# Patient Record
Sex: Male | Born: 1935 | Race: White | Hispanic: No
Health system: Southern US, Community
[De-identification: ages and names within clinical notes are randomized; demographics above are authoritative.]

## PROBLEM LIST (undated history)

## (undated) DIAGNOSIS — E119 Type 2 diabetes mellitus without complications: Secondary | ICD-10-CM

## (undated) DIAGNOSIS — I10 Essential (primary) hypertension: Secondary | ICD-10-CM

## (undated) HISTORY — PX: OTHER SURGICAL HISTORY: SHX169

---

## 2015-04-06 DIAGNOSIS — Z0000 Encounter for general adult medical examination without abnormal findings: Secondary | ICD-10-CM | POA: Diagnosis not present

## 2015-04-06 DIAGNOSIS — E7800 Pure hypercholesterolemia, unspecified: Secondary | ICD-10-CM | POA: Diagnosis not present

## 2015-04-06 DIAGNOSIS — Z1389 Encounter for screening for other disorder: Secondary | ICD-10-CM | POA: Diagnosis not present

## 2015-04-06 DIAGNOSIS — I10 Essential (primary) hypertension: Secondary | ICD-10-CM | POA: Diagnosis not present

## 2015-04-06 DIAGNOSIS — Z23 Encounter for immunization: Secondary | ICD-10-CM | POA: Diagnosis not present

## 2015-04-06 DIAGNOSIS — I679 Cerebrovascular disease, unspecified: Secondary | ICD-10-CM | POA: Diagnosis not present

## 2015-04-06 DIAGNOSIS — E119 Type 2 diabetes mellitus without complications: Secondary | ICD-10-CM | POA: Diagnosis not present

## 2015-05-06 DIAGNOSIS — B351 Tinea unguium: Secondary | ICD-10-CM | POA: Diagnosis not present

## 2015-07-08 DIAGNOSIS — B351 Tinea unguium: Secondary | ICD-10-CM | POA: Diagnosis not present

## 2015-07-08 DIAGNOSIS — E119 Type 2 diabetes mellitus without complications: Secondary | ICD-10-CM | POA: Diagnosis not present

## 2015-07-28 DIAGNOSIS — S91312A Laceration without foreign body, left foot, initial encounter: Secondary | ICD-10-CM | POA: Diagnosis not present

## 2015-07-28 DIAGNOSIS — W25XXXA Contact with sharp glass, initial encounter: Secondary | ICD-10-CM | POA: Diagnosis not present

## 2015-09-04 DIAGNOSIS — R69 Illness, unspecified: Secondary | ICD-10-CM | POA: Diagnosis not present

## 2015-10-08 DIAGNOSIS — E7800 Pure hypercholesterolemia, unspecified: Secondary | ICD-10-CM | POA: Diagnosis not present

## 2015-10-08 DIAGNOSIS — I10 Essential (primary) hypertension: Secondary | ICD-10-CM | POA: Diagnosis not present

## 2015-10-08 DIAGNOSIS — E119 Type 2 diabetes mellitus without complications: Secondary | ICD-10-CM | POA: Diagnosis not present

## 2015-10-26 DIAGNOSIS — S12590A Other displaced fracture of sixth cervical vertebra, initial encounter for closed fracture: Secondary | ICD-10-CM | POA: Diagnosis not present

## 2015-10-26 DIAGNOSIS — S134XXA Sprain of ligaments of cervical spine, initial encounter: Secondary | ICD-10-CM | POA: Diagnosis not present

## 2015-10-26 DIAGNOSIS — Z7982 Long term (current) use of aspirin: Secondary | ICD-10-CM | POA: Diagnosis not present

## 2015-10-26 DIAGNOSIS — S12501A Unspecified nondisplaced fracture of sixth cervical vertebra, initial encounter for closed fracture: Secondary | ICD-10-CM | POA: Diagnosis not present

## 2015-10-26 DIAGNOSIS — S0990XA Unspecified injury of head, initial encounter: Secondary | ICD-10-CM | POA: Diagnosis not present

## 2015-10-26 DIAGNOSIS — S0181XA Laceration without foreign body of other part of head, initial encounter: Secondary | ICD-10-CM | POA: Diagnosis not present

## 2015-10-26 DIAGNOSIS — S12591A Other nondisplaced fracture of sixth cervical vertebra, initial encounter for closed fracture: Secondary | ICD-10-CM | POA: Diagnosis not present

## 2015-10-26 DIAGNOSIS — I10 Essential (primary) hypertension: Secondary | ICD-10-CM | POA: Diagnosis not present

## 2015-10-26 DIAGNOSIS — S199XXA Unspecified injury of neck, initial encounter: Secondary | ICD-10-CM | POA: Diagnosis not present

## 2015-10-26 DIAGNOSIS — Z79899 Other long term (current) drug therapy: Secondary | ICD-10-CM | POA: Diagnosis not present

## 2015-10-26 DIAGNOSIS — W010XXA Fall on same level from slipping, tripping and stumbling without subsequent striking against object, initial encounter: Secondary | ICD-10-CM | POA: Diagnosis not present

## 2015-10-26 DIAGNOSIS — M542 Cervicalgia: Secondary | ICD-10-CM | POA: Diagnosis not present

## 2015-10-27 DIAGNOSIS — M452 Ankylosing spondylitis of cervical region: Secondary | ICD-10-CM | POA: Diagnosis not present

## 2015-10-29 DIAGNOSIS — Z79899 Other long term (current) drug therapy: Secondary | ICD-10-CM | POA: Diagnosis not present

## 2015-10-29 DIAGNOSIS — S12100A Unspecified displaced fracture of second cervical vertebra, initial encounter for closed fracture: Secondary | ICD-10-CM | POA: Diagnosis not present

## 2015-10-29 DIAGNOSIS — E119 Type 2 diabetes mellitus without complications: Secondary | ICD-10-CM | POA: Diagnosis not present

## 2015-10-29 DIAGNOSIS — I10 Essential (primary) hypertension: Secondary | ICD-10-CM | POA: Diagnosis not present

## 2015-10-29 DIAGNOSIS — Z4789 Encounter for other orthopedic aftercare: Secondary | ICD-10-CM | POA: Diagnosis not present

## 2015-10-29 DIAGNOSIS — M6281 Muscle weakness (generalized): Secondary | ICD-10-CM | POA: Diagnosis not present

## 2015-10-29 DIAGNOSIS — Z981 Arthrodesis status: Secondary | ICD-10-CM | POA: Diagnosis not present

## 2015-10-29 DIAGNOSIS — M549 Dorsalgia, unspecified: Secondary | ICD-10-CM | POA: Diagnosis not present

## 2015-10-29 DIAGNOSIS — E1165 Type 2 diabetes mellitus with hyperglycemia: Secondary | ICD-10-CM | POA: Diagnosis not present

## 2015-10-29 DIAGNOSIS — Z9181 History of falling: Secondary | ICD-10-CM | POA: Diagnosis not present

## 2015-10-29 DIAGNOSIS — S12601A Unspecified nondisplaced fracture of seventh cervical vertebra, initial encounter for closed fracture: Secondary | ICD-10-CM | POA: Diagnosis not present

## 2015-10-29 DIAGNOSIS — M4322 Fusion of spine, cervical region: Secondary | ICD-10-CM | POA: Diagnosis not present

## 2015-10-29 DIAGNOSIS — S12591A Other nondisplaced fracture of sixth cervical vertebra, initial encounter for closed fracture: Secondary | ICD-10-CM | POA: Diagnosis not present

## 2015-10-29 DIAGNOSIS — E785 Hyperlipidemia, unspecified: Secondary | ICD-10-CM | POA: Diagnosis not present

## 2015-10-29 DIAGNOSIS — S0181XA Laceration without foreign body of other part of head, initial encounter: Secondary | ICD-10-CM | POA: Diagnosis not present

## 2015-10-29 DIAGNOSIS — S12501A Unspecified nondisplaced fracture of sixth cervical vertebra, initial encounter for closed fracture: Secondary | ICD-10-CM | POA: Diagnosis not present

## 2015-10-29 DIAGNOSIS — R52 Pain, unspecified: Secondary | ICD-10-CM | POA: Diagnosis not present

## 2015-10-29 DIAGNOSIS — M25462 Effusion, left knee: Secondary | ICD-10-CM | POA: Diagnosis not present

## 2015-10-29 DIAGNOSIS — S12501D Unspecified nondisplaced fracture of sixth cervical vertebra, subsequent encounter for fracture with routine healing: Secondary | ICD-10-CM | POA: Diagnosis not present

## 2015-10-29 DIAGNOSIS — R262 Difficulty in walking, not elsewhere classified: Secondary | ICD-10-CM | POA: Diagnosis not present

## 2015-10-29 DIAGNOSIS — S0191XA Laceration without foreign body of unspecified part of head, initial encounter: Secondary | ICD-10-CM | POA: Diagnosis not present

## 2015-10-29 DIAGNOSIS — S129XXA Fracture of neck, unspecified, initial encounter: Secondary | ICD-10-CM | POA: Diagnosis not present

## 2015-10-29 DIAGNOSIS — Z87891 Personal history of nicotine dependence: Secondary | ICD-10-CM | POA: Diagnosis not present

## 2015-10-29 DIAGNOSIS — Z01818 Encounter for other preprocedural examination: Secondary | ICD-10-CM | POA: Diagnosis not present

## 2015-10-29 DIAGNOSIS — M25461 Effusion, right knee: Secondary | ICD-10-CM | POA: Diagnosis not present

## 2015-10-29 DIAGNOSIS — S12601D Unspecified nondisplaced fracture of seventh cervical vertebra, subsequent encounter for fracture with routine healing: Secondary | ICD-10-CM | POA: Diagnosis not present

## 2015-11-03 DIAGNOSIS — S12100A Unspecified displaced fracture of second cervical vertebra, initial encounter for closed fracture: Secondary | ICD-10-CM | POA: Diagnosis not present

## 2015-11-03 DIAGNOSIS — L03115 Cellulitis of right lower limb: Secondary | ICD-10-CM | POA: Diagnosis not present

## 2015-11-03 DIAGNOSIS — S12501D Unspecified nondisplaced fracture of sixth cervical vertebra, subsequent encounter for fracture with routine healing: Secondary | ICD-10-CM | POA: Diagnosis not present

## 2015-11-03 DIAGNOSIS — I350 Nonrheumatic aortic (valve) stenosis: Secondary | ICD-10-CM | POA: Diagnosis not present

## 2015-11-03 DIAGNOSIS — R262 Difficulty in walking, not elsewhere classified: Secondary | ICD-10-CM | POA: Diagnosis not present

## 2015-11-03 DIAGNOSIS — Z9181 History of falling: Secondary | ICD-10-CM | POA: Diagnosis not present

## 2015-11-03 DIAGNOSIS — E119 Type 2 diabetes mellitus without complications: Secondary | ICD-10-CM | POA: Diagnosis not present

## 2015-11-03 DIAGNOSIS — S12500A Unspecified displaced fracture of sixth cervical vertebra, initial encounter for closed fracture: Secondary | ICD-10-CM | POA: Diagnosis not present

## 2015-11-03 DIAGNOSIS — S12601D Unspecified nondisplaced fracture of seventh cervical vertebra, subsequent encounter for fracture with routine healing: Secondary | ICD-10-CM | POA: Diagnosis not present

## 2015-11-03 DIAGNOSIS — E785 Hyperlipidemia, unspecified: Secondary | ICD-10-CM | POA: Diagnosis not present

## 2015-11-03 DIAGNOSIS — Z981 Arthrodesis status: Secondary | ICD-10-CM | POA: Diagnosis not present

## 2015-11-03 DIAGNOSIS — R52 Pain, unspecified: Secondary | ICD-10-CM | POA: Diagnosis not present

## 2015-11-03 DIAGNOSIS — I10 Essential (primary) hypertension: Secondary | ICD-10-CM | POA: Diagnosis not present

## 2015-11-03 DIAGNOSIS — M6281 Muscle weakness (generalized): Secondary | ICD-10-CM | POA: Diagnosis not present

## 2015-11-03 DIAGNOSIS — S0191XA Laceration without foreign body of unspecified part of head, initial encounter: Secondary | ICD-10-CM | POA: Diagnosis not present

## 2015-11-04 DIAGNOSIS — I10 Essential (primary) hypertension: Secondary | ICD-10-CM | POA: Diagnosis not present

## 2015-11-04 DIAGNOSIS — L03115 Cellulitis of right lower limb: Secondary | ICD-10-CM | POA: Diagnosis not present

## 2015-11-04 DIAGNOSIS — S12500A Unspecified displaced fracture of sixth cervical vertebra, initial encounter for closed fracture: Secondary | ICD-10-CM | POA: Diagnosis not present

## 2015-11-04 DIAGNOSIS — I350 Nonrheumatic aortic (valve) stenosis: Secondary | ICD-10-CM | POA: Diagnosis not present

## 2015-11-04 DIAGNOSIS — E119 Type 2 diabetes mellitus without complications: Secondary | ICD-10-CM | POA: Diagnosis not present

## 2015-11-13 DIAGNOSIS — E119 Type 2 diabetes mellitus without complications: Secondary | ICD-10-CM | POA: Diagnosis not present

## 2015-11-13 DIAGNOSIS — E785 Hyperlipidemia, unspecified: Secondary | ICD-10-CM | POA: Diagnosis not present

## 2015-11-13 DIAGNOSIS — Z7982 Long term (current) use of aspirin: Secondary | ICD-10-CM | POA: Diagnosis not present

## 2015-11-13 DIAGNOSIS — S12601D Unspecified nondisplaced fracture of seventh cervical vertebra, subsequent encounter for fracture with routine healing: Secondary | ICD-10-CM | POA: Diagnosis not present

## 2015-11-13 DIAGNOSIS — I10 Essential (primary) hypertension: Secondary | ICD-10-CM | POA: Diagnosis not present

## 2015-11-13 DIAGNOSIS — Z4732 Aftercare following explantation of hip joint prosthesis: Secondary | ICD-10-CM | POA: Diagnosis not present

## 2015-11-13 DIAGNOSIS — Z7984 Long term (current) use of oral hypoglycemic drugs: Secondary | ICD-10-CM | POA: Diagnosis not present

## 2015-11-13 DIAGNOSIS — S0181XD Laceration without foreign body of other part of head, subsequent encounter: Secondary | ICD-10-CM | POA: Diagnosis not present

## 2015-11-13 DIAGNOSIS — S12501D Unspecified nondisplaced fracture of sixth cervical vertebra, subsequent encounter for fracture with routine healing: Secondary | ICD-10-CM | POA: Diagnosis not present

## 2015-11-13 DIAGNOSIS — Z981 Arthrodesis status: Secondary | ICD-10-CM | POA: Diagnosis not present

## 2015-11-15 DIAGNOSIS — Z981 Arthrodesis status: Secondary | ICD-10-CM | POA: Diagnosis not present

## 2015-11-15 DIAGNOSIS — Z4732 Aftercare following explantation of hip joint prosthesis: Secondary | ICD-10-CM | POA: Diagnosis not present

## 2015-11-15 DIAGNOSIS — E119 Type 2 diabetes mellitus without complications: Secondary | ICD-10-CM | POA: Diagnosis not present

## 2015-11-15 DIAGNOSIS — Z7982 Long term (current) use of aspirin: Secondary | ICD-10-CM | POA: Diagnosis not present

## 2015-11-15 DIAGNOSIS — S12601D Unspecified nondisplaced fracture of seventh cervical vertebra, subsequent encounter for fracture with routine healing: Secondary | ICD-10-CM | POA: Diagnosis not present

## 2015-11-15 DIAGNOSIS — S0181XD Laceration without foreign body of other part of head, subsequent encounter: Secondary | ICD-10-CM | POA: Diagnosis not present

## 2015-11-15 DIAGNOSIS — I10 Essential (primary) hypertension: Secondary | ICD-10-CM | POA: Diagnosis not present

## 2015-11-15 DIAGNOSIS — E785 Hyperlipidemia, unspecified: Secondary | ICD-10-CM | POA: Diagnosis not present

## 2015-11-15 DIAGNOSIS — Z7984 Long term (current) use of oral hypoglycemic drugs: Secondary | ICD-10-CM | POA: Diagnosis not present

## 2015-11-15 DIAGNOSIS — S12501D Unspecified nondisplaced fracture of sixth cervical vertebra, subsequent encounter for fracture with routine healing: Secondary | ICD-10-CM | POA: Diagnosis not present

## 2015-11-22 DIAGNOSIS — Z981 Arthrodesis status: Secondary | ICD-10-CM | POA: Diagnosis not present

## 2015-11-22 DIAGNOSIS — E119 Type 2 diabetes mellitus without complications: Secondary | ICD-10-CM | POA: Diagnosis not present

## 2015-11-22 DIAGNOSIS — I10 Essential (primary) hypertension: Secondary | ICD-10-CM | POA: Diagnosis not present

## 2015-11-22 DIAGNOSIS — Z7982 Long term (current) use of aspirin: Secondary | ICD-10-CM | POA: Diagnosis not present

## 2015-11-22 DIAGNOSIS — S12501D Unspecified nondisplaced fracture of sixth cervical vertebra, subsequent encounter for fracture with routine healing: Secondary | ICD-10-CM | POA: Diagnosis not present

## 2015-11-22 DIAGNOSIS — S0181XD Laceration without foreign body of other part of head, subsequent encounter: Secondary | ICD-10-CM | POA: Diagnosis not present

## 2015-11-22 DIAGNOSIS — Z4732 Aftercare following explantation of hip joint prosthesis: Secondary | ICD-10-CM | POA: Diagnosis not present

## 2015-11-22 DIAGNOSIS — S12601D Unspecified nondisplaced fracture of seventh cervical vertebra, subsequent encounter for fracture with routine healing: Secondary | ICD-10-CM | POA: Diagnosis not present

## 2015-11-22 DIAGNOSIS — E785 Hyperlipidemia, unspecified: Secondary | ICD-10-CM | POA: Diagnosis not present

## 2015-11-22 DIAGNOSIS — Z7984 Long term (current) use of oral hypoglycemic drugs: Secondary | ICD-10-CM | POA: Diagnosis not present

## 2015-11-24 DIAGNOSIS — I10 Essential (primary) hypertension: Secondary | ICD-10-CM | POA: Diagnosis not present

## 2015-11-24 DIAGNOSIS — Z7982 Long term (current) use of aspirin: Secondary | ICD-10-CM | POA: Diagnosis not present

## 2015-11-24 DIAGNOSIS — S12601D Unspecified nondisplaced fracture of seventh cervical vertebra, subsequent encounter for fracture with routine healing: Secondary | ICD-10-CM | POA: Diagnosis not present

## 2015-11-24 DIAGNOSIS — Z4732 Aftercare following explantation of hip joint prosthesis: Secondary | ICD-10-CM | POA: Diagnosis not present

## 2015-11-24 DIAGNOSIS — Z981 Arthrodesis status: Secondary | ICD-10-CM | POA: Diagnosis not present

## 2015-11-24 DIAGNOSIS — Z7984 Long term (current) use of oral hypoglycemic drugs: Secondary | ICD-10-CM | POA: Diagnosis not present

## 2015-11-24 DIAGNOSIS — E119 Type 2 diabetes mellitus without complications: Secondary | ICD-10-CM | POA: Diagnosis not present

## 2015-11-24 DIAGNOSIS — S12501D Unspecified nondisplaced fracture of sixth cervical vertebra, subsequent encounter for fracture with routine healing: Secondary | ICD-10-CM | POA: Diagnosis not present

## 2015-11-24 DIAGNOSIS — E785 Hyperlipidemia, unspecified: Secondary | ICD-10-CM | POA: Diagnosis not present

## 2015-11-24 DIAGNOSIS — S0181XD Laceration without foreign body of other part of head, subsequent encounter: Secondary | ICD-10-CM | POA: Diagnosis not present

## 2015-11-25 DIAGNOSIS — M542 Cervicalgia: Secondary | ICD-10-CM | POA: Diagnosis not present

## 2015-11-26 DIAGNOSIS — S12601D Unspecified nondisplaced fracture of seventh cervical vertebra, subsequent encounter for fracture with routine healing: Secondary | ICD-10-CM | POA: Diagnosis not present

## 2015-11-26 DIAGNOSIS — Z4732 Aftercare following explantation of hip joint prosthesis: Secondary | ICD-10-CM | POA: Diagnosis not present

## 2015-11-26 DIAGNOSIS — S12501D Unspecified nondisplaced fracture of sixth cervical vertebra, subsequent encounter for fracture with routine healing: Secondary | ICD-10-CM | POA: Diagnosis not present

## 2015-11-26 DIAGNOSIS — Z7984 Long term (current) use of oral hypoglycemic drugs: Secondary | ICD-10-CM | POA: Diagnosis not present

## 2015-11-26 DIAGNOSIS — S0181XD Laceration without foreign body of other part of head, subsequent encounter: Secondary | ICD-10-CM | POA: Diagnosis not present

## 2015-11-26 DIAGNOSIS — Z981 Arthrodesis status: Secondary | ICD-10-CM | POA: Diagnosis not present

## 2015-11-26 DIAGNOSIS — I10 Essential (primary) hypertension: Secondary | ICD-10-CM | POA: Diagnosis not present

## 2015-11-26 DIAGNOSIS — E119 Type 2 diabetes mellitus without complications: Secondary | ICD-10-CM | POA: Diagnosis not present

## 2015-11-26 DIAGNOSIS — Z7982 Long term (current) use of aspirin: Secondary | ICD-10-CM | POA: Diagnosis not present

## 2015-11-26 DIAGNOSIS — E785 Hyperlipidemia, unspecified: Secondary | ICD-10-CM | POA: Diagnosis not present

## 2015-11-29 DIAGNOSIS — E119 Type 2 diabetes mellitus without complications: Secondary | ICD-10-CM | POA: Diagnosis not present

## 2015-11-29 DIAGNOSIS — Z7982 Long term (current) use of aspirin: Secondary | ICD-10-CM | POA: Diagnosis not present

## 2015-11-29 DIAGNOSIS — Z981 Arthrodesis status: Secondary | ICD-10-CM | POA: Diagnosis not present

## 2015-11-29 DIAGNOSIS — E785 Hyperlipidemia, unspecified: Secondary | ICD-10-CM | POA: Diagnosis not present

## 2015-11-29 DIAGNOSIS — Z7984 Long term (current) use of oral hypoglycemic drugs: Secondary | ICD-10-CM | POA: Diagnosis not present

## 2015-11-29 DIAGNOSIS — S12601D Unspecified nondisplaced fracture of seventh cervical vertebra, subsequent encounter for fracture with routine healing: Secondary | ICD-10-CM | POA: Diagnosis not present

## 2015-11-29 DIAGNOSIS — I10 Essential (primary) hypertension: Secondary | ICD-10-CM | POA: Diagnosis not present

## 2015-11-29 DIAGNOSIS — S12501D Unspecified nondisplaced fracture of sixth cervical vertebra, subsequent encounter for fracture with routine healing: Secondary | ICD-10-CM | POA: Diagnosis not present

## 2015-11-29 DIAGNOSIS — S0181XD Laceration without foreign body of other part of head, subsequent encounter: Secondary | ICD-10-CM | POA: Diagnosis not present

## 2015-11-29 DIAGNOSIS — Z4732 Aftercare following explantation of hip joint prosthesis: Secondary | ICD-10-CM | POA: Diagnosis not present

## 2015-12-01 DIAGNOSIS — E785 Hyperlipidemia, unspecified: Secondary | ICD-10-CM | POA: Diagnosis not present

## 2015-12-01 DIAGNOSIS — E119 Type 2 diabetes mellitus without complications: Secondary | ICD-10-CM | POA: Diagnosis not present

## 2015-12-01 DIAGNOSIS — Z7982 Long term (current) use of aspirin: Secondary | ICD-10-CM | POA: Diagnosis not present

## 2015-12-01 DIAGNOSIS — S12501D Unspecified nondisplaced fracture of sixth cervical vertebra, subsequent encounter for fracture with routine healing: Secondary | ICD-10-CM | POA: Diagnosis not present

## 2015-12-01 DIAGNOSIS — S12601D Unspecified nondisplaced fracture of seventh cervical vertebra, subsequent encounter for fracture with routine healing: Secondary | ICD-10-CM | POA: Diagnosis not present

## 2015-12-01 DIAGNOSIS — I10 Essential (primary) hypertension: Secondary | ICD-10-CM | POA: Diagnosis not present

## 2015-12-01 DIAGNOSIS — Z4732 Aftercare following explantation of hip joint prosthesis: Secondary | ICD-10-CM | POA: Diagnosis not present

## 2015-12-01 DIAGNOSIS — S0181XD Laceration without foreign body of other part of head, subsequent encounter: Secondary | ICD-10-CM | POA: Diagnosis not present

## 2015-12-01 DIAGNOSIS — Z7984 Long term (current) use of oral hypoglycemic drugs: Secondary | ICD-10-CM | POA: Diagnosis not present

## 2015-12-01 DIAGNOSIS — Z981 Arthrodesis status: Secondary | ICD-10-CM | POA: Diagnosis not present

## 2015-12-03 DIAGNOSIS — E785 Hyperlipidemia, unspecified: Secondary | ICD-10-CM | POA: Diagnosis not present

## 2015-12-03 DIAGNOSIS — E119 Type 2 diabetes mellitus without complications: Secondary | ICD-10-CM | POA: Diagnosis not present

## 2015-12-03 DIAGNOSIS — S12501D Unspecified nondisplaced fracture of sixth cervical vertebra, subsequent encounter for fracture with routine healing: Secondary | ICD-10-CM | POA: Diagnosis not present

## 2015-12-03 DIAGNOSIS — Z981 Arthrodesis status: Secondary | ICD-10-CM | POA: Diagnosis not present

## 2015-12-03 DIAGNOSIS — I10 Essential (primary) hypertension: Secondary | ICD-10-CM | POA: Diagnosis not present

## 2015-12-03 DIAGNOSIS — Z4732 Aftercare following explantation of hip joint prosthesis: Secondary | ICD-10-CM | POA: Diagnosis not present

## 2015-12-03 DIAGNOSIS — S0181XD Laceration without foreign body of other part of head, subsequent encounter: Secondary | ICD-10-CM | POA: Diagnosis not present

## 2015-12-03 DIAGNOSIS — Z7982 Long term (current) use of aspirin: Secondary | ICD-10-CM | POA: Diagnosis not present

## 2015-12-03 DIAGNOSIS — S12601D Unspecified nondisplaced fracture of seventh cervical vertebra, subsequent encounter for fracture with routine healing: Secondary | ICD-10-CM | POA: Diagnosis not present

## 2015-12-03 DIAGNOSIS — Z7984 Long term (current) use of oral hypoglycemic drugs: Secondary | ICD-10-CM | POA: Diagnosis not present

## 2015-12-06 DIAGNOSIS — S0181XD Laceration without foreign body of other part of head, subsequent encounter: Secondary | ICD-10-CM | POA: Diagnosis not present

## 2015-12-06 DIAGNOSIS — Z981 Arthrodesis status: Secondary | ICD-10-CM | POA: Diagnosis not present

## 2015-12-06 DIAGNOSIS — S12501D Unspecified nondisplaced fracture of sixth cervical vertebra, subsequent encounter for fracture with routine healing: Secondary | ICD-10-CM | POA: Diagnosis not present

## 2015-12-06 DIAGNOSIS — Z7984 Long term (current) use of oral hypoglycemic drugs: Secondary | ICD-10-CM | POA: Diagnosis not present

## 2015-12-06 DIAGNOSIS — E119 Type 2 diabetes mellitus without complications: Secondary | ICD-10-CM | POA: Diagnosis not present

## 2015-12-06 DIAGNOSIS — I10 Essential (primary) hypertension: Secondary | ICD-10-CM | POA: Diagnosis not present

## 2015-12-06 DIAGNOSIS — S12601D Unspecified nondisplaced fracture of seventh cervical vertebra, subsequent encounter for fracture with routine healing: Secondary | ICD-10-CM | POA: Diagnosis not present

## 2015-12-06 DIAGNOSIS — E785 Hyperlipidemia, unspecified: Secondary | ICD-10-CM | POA: Diagnosis not present

## 2015-12-06 DIAGNOSIS — Z7982 Long term (current) use of aspirin: Secondary | ICD-10-CM | POA: Diagnosis not present

## 2015-12-06 DIAGNOSIS — Z4732 Aftercare following explantation of hip joint prosthesis: Secondary | ICD-10-CM | POA: Diagnosis not present

## 2015-12-08 DIAGNOSIS — Z7984 Long term (current) use of oral hypoglycemic drugs: Secondary | ICD-10-CM | POA: Diagnosis not present

## 2015-12-08 DIAGNOSIS — Z7982 Long term (current) use of aspirin: Secondary | ICD-10-CM | POA: Diagnosis not present

## 2015-12-08 DIAGNOSIS — Z4732 Aftercare following explantation of hip joint prosthesis: Secondary | ICD-10-CM | POA: Diagnosis not present

## 2015-12-08 DIAGNOSIS — S0181XD Laceration without foreign body of other part of head, subsequent encounter: Secondary | ICD-10-CM | POA: Diagnosis not present

## 2015-12-08 DIAGNOSIS — Z981 Arthrodesis status: Secondary | ICD-10-CM | POA: Diagnosis not present

## 2015-12-08 DIAGNOSIS — E119 Type 2 diabetes mellitus without complications: Secondary | ICD-10-CM | POA: Diagnosis not present

## 2015-12-08 DIAGNOSIS — S12601D Unspecified nondisplaced fracture of seventh cervical vertebra, subsequent encounter for fracture with routine healing: Secondary | ICD-10-CM | POA: Diagnosis not present

## 2015-12-08 DIAGNOSIS — I10 Essential (primary) hypertension: Secondary | ICD-10-CM | POA: Diagnosis not present

## 2015-12-08 DIAGNOSIS — E785 Hyperlipidemia, unspecified: Secondary | ICD-10-CM | POA: Diagnosis not present

## 2015-12-08 DIAGNOSIS — S12501D Unspecified nondisplaced fracture of sixth cervical vertebra, subsequent encounter for fracture with routine healing: Secondary | ICD-10-CM | POA: Diagnosis not present

## 2015-12-13 DIAGNOSIS — S0181XD Laceration without foreign body of other part of head, subsequent encounter: Secondary | ICD-10-CM | POA: Diagnosis not present

## 2015-12-13 DIAGNOSIS — Z4732 Aftercare following explantation of hip joint prosthesis: Secondary | ICD-10-CM | POA: Diagnosis not present

## 2015-12-13 DIAGNOSIS — S12501D Unspecified nondisplaced fracture of sixth cervical vertebra, subsequent encounter for fracture with routine healing: Secondary | ICD-10-CM | POA: Diagnosis not present

## 2015-12-13 DIAGNOSIS — S12601D Unspecified nondisplaced fracture of seventh cervical vertebra, subsequent encounter for fracture with routine healing: Secondary | ICD-10-CM | POA: Diagnosis not present

## 2015-12-28 DIAGNOSIS — S12591D Other nondisplaced fracture of sixth cervical vertebra, subsequent encounter for fracture with routine healing: Secondary | ICD-10-CM | POA: Diagnosis not present

## 2015-12-28 DIAGNOSIS — S12601D Unspecified nondisplaced fracture of seventh cervical vertebra, subsequent encounter for fracture with routine healing: Secondary | ICD-10-CM | POA: Diagnosis not present

## 2016-02-03 DIAGNOSIS — M13861 Other specified arthritis, right knee: Secondary | ICD-10-CM | POA: Diagnosis not present

## 2016-02-03 DIAGNOSIS — S12601D Unspecified nondisplaced fracture of seventh cervical vertebra, subsequent encounter for fracture with routine healing: Secondary | ICD-10-CM | POA: Diagnosis not present

## 2016-04-19 DIAGNOSIS — Z23 Encounter for immunization: Secondary | ICD-10-CM | POA: Diagnosis not present

## 2016-04-19 DIAGNOSIS — E119 Type 2 diabetes mellitus without complications: Secondary | ICD-10-CM | POA: Diagnosis not present

## 2016-07-24 DIAGNOSIS — Z0000 Encounter for general adult medical examination without abnormal findings: Secondary | ICD-10-CM | POA: Diagnosis not present

## 2016-07-24 DIAGNOSIS — E119 Type 2 diabetes mellitus without complications: Secondary | ICD-10-CM | POA: Diagnosis not present

## 2016-07-24 DIAGNOSIS — N401 Enlarged prostate with lower urinary tract symptoms: Secondary | ICD-10-CM | POA: Diagnosis not present

## 2016-07-24 DIAGNOSIS — I10 Essential (primary) hypertension: Secondary | ICD-10-CM | POA: Diagnosis not present

## 2016-07-24 DIAGNOSIS — E785 Hyperlipidemia, unspecified: Secondary | ICD-10-CM | POA: Diagnosis not present

## 2016-07-24 DIAGNOSIS — Z1212 Encounter for screening for malignant neoplasm of rectum: Secondary | ICD-10-CM | POA: Diagnosis not present

## 2016-07-24 DIAGNOSIS — Z1389 Encounter for screening for other disorder: Secondary | ICD-10-CM | POA: Diagnosis not present

## 2016-08-03 DIAGNOSIS — M13861 Other specified arthritis, right knee: Secondary | ICD-10-CM | POA: Diagnosis not present

## 2016-08-03 DIAGNOSIS — S12591D Other nondisplaced fracture of sixth cervical vertebra, subsequent encounter for fracture with routine healing: Secondary | ICD-10-CM | POA: Diagnosis not present

## 2016-08-03 DIAGNOSIS — S12601D Unspecified nondisplaced fracture of seventh cervical vertebra, subsequent encounter for fracture with routine healing: Secondary | ICD-10-CM | POA: Diagnosis not present

## 2016-08-23 DIAGNOSIS — N400 Enlarged prostate without lower urinary tract symptoms: Secondary | ICD-10-CM | POA: Diagnosis not present

## 2016-08-23 DIAGNOSIS — H6122 Impacted cerumen, left ear: Secondary | ICD-10-CM | POA: Diagnosis not present

## 2016-10-28 DIAGNOSIS — S0181XA Laceration without foreign body of other part of head, initial encounter: Secondary | ICD-10-CM | POA: Diagnosis not present

## 2016-12-20 DIAGNOSIS — E119 Type 2 diabetes mellitus without complications: Secondary | ICD-10-CM | POA: Diagnosis not present

## 2016-12-20 DIAGNOSIS — I10 Essential (primary) hypertension: Secondary | ICD-10-CM | POA: Diagnosis not present

## 2016-12-20 DIAGNOSIS — N400 Enlarged prostate without lower urinary tract symptoms: Secondary | ICD-10-CM | POA: Diagnosis not present

## 2017-01-18 DIAGNOSIS — T23202A Burn of second degree of left hand, unspecified site, initial encounter: Secondary | ICD-10-CM | POA: Diagnosis not present

## 2017-01-29 DIAGNOSIS — X19XXXA Contact with other heat and hot substances, initial encounter: Secondary | ICD-10-CM | POA: Diagnosis not present

## 2017-01-29 DIAGNOSIS — T23002A Burn of unspecified degree of left hand, unspecified site, initial encounter: Secondary | ICD-10-CM | POA: Diagnosis not present

## 2017-01-29 DIAGNOSIS — Z23 Encounter for immunization: Secondary | ICD-10-CM | POA: Diagnosis not present

## 2017-01-29 DIAGNOSIS — T23201A Burn of second degree of right hand, unspecified site, initial encounter: Secondary | ICD-10-CM | POA: Diagnosis not present

## 2017-01-29 DIAGNOSIS — I10 Essential (primary) hypertension: Secondary | ICD-10-CM | POA: Diagnosis not present

## 2017-01-29 DIAGNOSIS — Y998 Other external cause status: Secondary | ICD-10-CM | POA: Diagnosis not present

## 2017-01-29 DIAGNOSIS — E119 Type 2 diabetes mellitus without complications: Secondary | ICD-10-CM | POA: Diagnosis not present

## 2017-01-29 DIAGNOSIS — X12XXXA Contact with other hot fluids, initial encounter: Secondary | ICD-10-CM | POA: Diagnosis not present

## 2017-02-01 DIAGNOSIS — Z87891 Personal history of nicotine dependence: Secondary | ICD-10-CM | POA: Diagnosis not present

## 2017-02-01 DIAGNOSIS — Z7984 Long term (current) use of oral hypoglycemic drugs: Secondary | ICD-10-CM | POA: Diagnosis not present

## 2017-02-01 DIAGNOSIS — T23262A Burn of second degree of back of left hand, initial encounter: Secondary | ICD-10-CM | POA: Diagnosis not present

## 2017-02-01 DIAGNOSIS — X020XXA Exposure to flames in controlled fire in building or structure, initial encounter: Secondary | ICD-10-CM | POA: Diagnosis not present

## 2017-02-01 DIAGNOSIS — Z8619 Personal history of other infectious and parasitic diseases: Secondary | ICD-10-CM | POA: Diagnosis not present

## 2017-02-01 DIAGNOSIS — T310 Burns involving less than 10% of body surface: Secondary | ICD-10-CM | POA: Diagnosis not present

## 2017-02-01 DIAGNOSIS — E119 Type 2 diabetes mellitus without complications: Secondary | ICD-10-CM | POA: Diagnosis not present

## 2017-02-12 DIAGNOSIS — X020XXD Exposure to flames in controlled fire in building or structure, subsequent encounter: Secondary | ICD-10-CM | POA: Diagnosis not present

## 2017-02-12 DIAGNOSIS — T23262D Burn of second degree of back of left hand, subsequent encounter: Secondary | ICD-10-CM | POA: Diagnosis not present

## 2017-02-12 DIAGNOSIS — T310 Burns involving less than 10% of body surface: Secondary | ICD-10-CM | POA: Diagnosis not present

## 2017-02-15 DIAGNOSIS — Z09 Encounter for follow-up examination after completed treatment for conditions other than malignant neoplasm: Secondary | ICD-10-CM | POA: Diagnosis not present

## 2017-02-15 DIAGNOSIS — T23062A Burn of unspecified degree of back of left hand, initial encounter: Secondary | ICD-10-CM | POA: Diagnosis not present

## 2017-03-23 DIAGNOSIS — Z23 Encounter for immunization: Secondary | ICD-10-CM | POA: Diagnosis not present

## 2017-03-23 DIAGNOSIS — E119 Type 2 diabetes mellitus without complications: Secondary | ICD-10-CM | POA: Diagnosis not present

## 2017-03-23 DIAGNOSIS — I10 Essential (primary) hypertension: Secondary | ICD-10-CM | POA: Diagnosis not present

## 2017-03-23 DIAGNOSIS — E7800 Pure hypercholesterolemia, unspecified: Secondary | ICD-10-CM | POA: Diagnosis not present

## 2017-06-25 DIAGNOSIS — E119 Type 2 diabetes mellitus without complications: Secondary | ICD-10-CM | POA: Diagnosis not present

## 2017-06-25 DIAGNOSIS — R69 Illness, unspecified: Secondary | ICD-10-CM | POA: Diagnosis not present

## 2017-09-26 DIAGNOSIS — I679 Cerebrovascular disease, unspecified: Secondary | ICD-10-CM | POA: Diagnosis not present

## 2017-09-26 DIAGNOSIS — R69 Illness, unspecified: Secondary | ICD-10-CM | POA: Diagnosis not present

## 2017-09-26 DIAGNOSIS — Z0000 Encounter for general adult medical examination without abnormal findings: Secondary | ICD-10-CM | POA: Diagnosis not present

## 2017-09-26 DIAGNOSIS — I10 Essential (primary) hypertension: Secondary | ICD-10-CM | POA: Diagnosis not present

## 2017-09-26 DIAGNOSIS — E119 Type 2 diabetes mellitus without complications: Secondary | ICD-10-CM | POA: Diagnosis not present

## 2017-09-26 DIAGNOSIS — E7800 Pure hypercholesterolemia, unspecified: Secondary | ICD-10-CM | POA: Diagnosis not present

## 2017-09-26 DIAGNOSIS — Z1389 Encounter for screening for other disorder: Secondary | ICD-10-CM | POA: Diagnosis not present

## 2017-10-11 DIAGNOSIS — Z1212 Encounter for screening for malignant neoplasm of rectum: Secondary | ICD-10-CM | POA: Diagnosis not present

## 2017-11-15 DIAGNOSIS — R69 Illness, unspecified: Secondary | ICD-10-CM | POA: Diagnosis not present

## 2017-12-31 DIAGNOSIS — E119 Type 2 diabetes mellitus without complications: Secondary | ICD-10-CM | POA: Diagnosis not present

## 2018-01-26 DIAGNOSIS — E860 Dehydration: Secondary | ICD-10-CM | POA: Diagnosis not present

## 2018-01-26 DIAGNOSIS — W19XXXA Unspecified fall, initial encounter: Secondary | ICD-10-CM | POA: Diagnosis not present

## 2018-01-26 DIAGNOSIS — R55 Syncope and collapse: Secondary | ICD-10-CM | POA: Diagnosis not present

## 2018-01-26 DIAGNOSIS — S0181XA Laceration without foreign body of other part of head, initial encounter: Secondary | ICD-10-CM | POA: Diagnosis not present

## 2018-01-26 DIAGNOSIS — S0990XA Unspecified injury of head, initial encounter: Secondary | ICD-10-CM | POA: Diagnosis not present

## 2018-01-26 DIAGNOSIS — S199XXA Unspecified injury of neck, initial encounter: Secondary | ICD-10-CM | POA: Diagnosis not present

## 2018-01-26 DIAGNOSIS — S299XXA Unspecified injury of thorax, initial encounter: Secondary | ICD-10-CM | POA: Diagnosis not present

## 2018-02-14 DIAGNOSIS — R69 Illness, unspecified: Secondary | ICD-10-CM | POA: Diagnosis not present

## 2018-04-03 DIAGNOSIS — E119 Type 2 diabetes mellitus without complications: Secondary | ICD-10-CM | POA: Diagnosis not present

## 2018-04-03 DIAGNOSIS — Z23 Encounter for immunization: Secondary | ICD-10-CM | POA: Diagnosis not present

## 2018-05-01 DIAGNOSIS — E119 Type 2 diabetes mellitus without complications: Secondary | ICD-10-CM | POA: Diagnosis not present

## 2018-05-01 DIAGNOSIS — H524 Presbyopia: Secondary | ICD-10-CM | POA: Diagnosis not present

## 2018-05-01 DIAGNOSIS — H52223 Regular astigmatism, bilateral: Secondary | ICD-10-CM | POA: Diagnosis not present

## 2018-05-01 DIAGNOSIS — H5203 Hypermetropia, bilateral: Secondary | ICD-10-CM | POA: Diagnosis not present

## 2018-05-01 DIAGNOSIS — Z7984 Long term (current) use of oral hypoglycemic drugs: Secondary | ICD-10-CM | POA: Diagnosis not present

## 2018-05-01 DIAGNOSIS — H25813 Combined forms of age-related cataract, bilateral: Secondary | ICD-10-CM | POA: Diagnosis not present

## 2018-05-08 DIAGNOSIS — R69 Illness, unspecified: Secondary | ICD-10-CM | POA: Diagnosis not present

## 2018-05-09 DIAGNOSIS — R69 Illness, unspecified: Secondary | ICD-10-CM | POA: Diagnosis not present

## 2018-07-02 DIAGNOSIS — H25043 Posterior subcapsular polar age-related cataract, bilateral: Secondary | ICD-10-CM | POA: Diagnosis not present

## 2018-07-02 DIAGNOSIS — H2513 Age-related nuclear cataract, bilateral: Secondary | ICD-10-CM | POA: Diagnosis not present

## 2018-07-02 DIAGNOSIS — H02831 Dermatochalasis of right upper eyelid: Secondary | ICD-10-CM | POA: Diagnosis not present

## 2018-07-02 DIAGNOSIS — H25013 Cortical age-related cataract, bilateral: Secondary | ICD-10-CM | POA: Diagnosis not present

## 2018-07-02 DIAGNOSIS — H2511 Age-related nuclear cataract, right eye: Secondary | ICD-10-CM | POA: Diagnosis not present

## 2018-07-02 DIAGNOSIS — H18413 Arcus senilis, bilateral: Secondary | ICD-10-CM | POA: Diagnosis not present

## 2018-07-05 DIAGNOSIS — Z9841 Cataract extraction status, right eye: Secondary | ICD-10-CM | POA: Diagnosis not present

## 2018-07-05 DIAGNOSIS — H25811 Combined forms of age-related cataract, right eye: Secondary | ICD-10-CM | POA: Diagnosis not present

## 2018-07-05 DIAGNOSIS — Z961 Presence of intraocular lens: Secondary | ICD-10-CM | POA: Diagnosis not present

## 2018-07-05 DIAGNOSIS — H2511 Age-related nuclear cataract, right eye: Secondary | ICD-10-CM | POA: Diagnosis not present

## 2018-07-05 DIAGNOSIS — H2512 Age-related nuclear cataract, left eye: Secondary | ICD-10-CM | POA: Diagnosis not present

## 2018-07-11 DIAGNOSIS — R634 Abnormal weight loss: Secondary | ICD-10-CM | POA: Diagnosis not present

## 2018-08-27 DIAGNOSIS — R69 Illness, unspecified: Secondary | ICD-10-CM | POA: Diagnosis not present

## 2018-10-11 DIAGNOSIS — R69 Illness, unspecified: Secondary | ICD-10-CM | POA: Diagnosis not present

## 2018-10-25 DIAGNOSIS — E7800 Pure hypercholesterolemia, unspecified: Secondary | ICD-10-CM | POA: Diagnosis not present

## 2018-10-25 DIAGNOSIS — E119 Type 2 diabetes mellitus without complications: Secondary | ICD-10-CM | POA: Diagnosis not present

## 2018-10-25 DIAGNOSIS — Z1389 Encounter for screening for other disorder: Secondary | ICD-10-CM | POA: Diagnosis not present

## 2018-10-25 DIAGNOSIS — Z0000 Encounter for general adult medical examination without abnormal findings: Secondary | ICD-10-CM | POA: Diagnosis not present

## 2018-10-25 DIAGNOSIS — I10 Essential (primary) hypertension: Secondary | ICD-10-CM | POA: Diagnosis not present

## 2018-10-25 DIAGNOSIS — N400 Enlarged prostate without lower urinary tract symptoms: Secondary | ICD-10-CM | POA: Diagnosis not present

## 2018-10-25 DIAGNOSIS — I679 Cerebrovascular disease, unspecified: Secondary | ICD-10-CM | POA: Diagnosis not present

## 2018-11-06 DIAGNOSIS — Z1211 Encounter for screening for malignant neoplasm of colon: Secondary | ICD-10-CM | POA: Diagnosis not present

## 2018-12-04 DIAGNOSIS — R69 Illness, unspecified: Secondary | ICD-10-CM | POA: Diagnosis not present

## 2019-01-24 DIAGNOSIS — I10 Essential (primary) hypertension: Secondary | ICD-10-CM | POA: Diagnosis not present

## 2019-01-24 DIAGNOSIS — R634 Abnormal weight loss: Secondary | ICD-10-CM | POA: Diagnosis not present

## 2019-01-24 DIAGNOSIS — R69 Illness, unspecified: Secondary | ICD-10-CM | POA: Diagnosis not present

## 2019-01-24 DIAGNOSIS — E119 Type 2 diabetes mellitus without complications: Secondary | ICD-10-CM | POA: Diagnosis not present

## 2019-01-29 DIAGNOSIS — D649 Anemia, unspecified: Secondary | ICD-10-CM | POA: Diagnosis not present

## 2019-02-06 DIAGNOSIS — E119 Type 2 diabetes mellitus without complications: Secondary | ICD-10-CM | POA: Diagnosis not present

## 2019-02-06 DIAGNOSIS — Z6821 Body mass index (BMI) 21.0-21.9, adult: Secondary | ICD-10-CM | POA: Diagnosis not present

## 2019-02-06 DIAGNOSIS — Z713 Dietary counseling and surveillance: Secondary | ICD-10-CM | POA: Diagnosis not present

## 2019-02-12 DIAGNOSIS — R634 Abnormal weight loss: Secondary | ICD-10-CM | POA: Diagnosis not present

## 2019-03-04 DIAGNOSIS — R634 Abnormal weight loss: Secondary | ICD-10-CM | POA: Diagnosis not present

## 2019-03-04 DIAGNOSIS — N200 Calculus of kidney: Secondary | ICD-10-CM | POA: Diagnosis not present

## 2019-03-04 DIAGNOSIS — K8020 Calculus of gallbladder without cholecystitis without obstruction: Secondary | ICD-10-CM | POA: Diagnosis not present

## 2019-03-18 DIAGNOSIS — R69 Illness, unspecified: Secondary | ICD-10-CM | POA: Diagnosis not present

## 2019-04-29 DIAGNOSIS — E119 Type 2 diabetes mellitus without complications: Secondary | ICD-10-CM | POA: Diagnosis not present

## 2019-05-02 DIAGNOSIS — Z23 Encounter for immunization: Secondary | ICD-10-CM | POA: Diagnosis not present

## 2019-05-09 DIAGNOSIS — R69 Illness, unspecified: Secondary | ICD-10-CM | POA: Diagnosis not present

## 2019-06-23 DIAGNOSIS — R69 Illness, unspecified: Secondary | ICD-10-CM | POA: Diagnosis not present

## 2019-06-29 ENCOUNTER — Emergency Department (HOSPITAL_COMMUNITY): Payer: Medicare HMO

## 2019-06-29 ENCOUNTER — Inpatient Hospital Stay (HOSPITAL_COMMUNITY)
Admission: EM | Admit: 2019-06-29 | Discharge: 2019-07-28 | DRG: 207 | Disposition: E | Payer: Medicare HMO | Attending: Internal Medicine | Admitting: Internal Medicine

## 2019-06-29 ENCOUNTER — Inpatient Hospital Stay (HOSPITAL_COMMUNITY): Payer: Medicare HMO

## 2019-06-29 ENCOUNTER — Encounter (HOSPITAL_COMMUNITY): Payer: Self-pay | Admitting: Primary Care

## 2019-06-29 DIAGNOSIS — R578 Other shock: Secondary | ICD-10-CM | POA: Diagnosis not present

## 2019-06-29 DIAGNOSIS — Z978 Presence of other specified devices: Secondary | ICD-10-CM

## 2019-06-29 DIAGNOSIS — J9690 Respiratory failure, unspecified, unspecified whether with hypoxia or hypercapnia: Secondary | ICD-10-CM | POA: Diagnosis not present

## 2019-06-29 DIAGNOSIS — G9340 Encephalopathy, unspecified: Secondary | ICD-10-CM | POA: Diagnosis not present

## 2019-06-29 DIAGNOSIS — R4182 Altered mental status, unspecified: Secondary | ICD-10-CM

## 2019-06-29 DIAGNOSIS — R29810 Facial weakness: Secondary | ICD-10-CM | POA: Diagnosis not present

## 2019-06-29 DIAGNOSIS — E872 Acidosis: Secondary | ICD-10-CM | POA: Diagnosis not present

## 2019-06-29 DIAGNOSIS — D689 Coagulation defect, unspecified: Secondary | ICD-10-CM | POA: Diagnosis not present

## 2019-06-29 DIAGNOSIS — S199XXA Unspecified injury of neck, initial encounter: Secondary | ICD-10-CM | POA: Diagnosis not present

## 2019-06-29 DIAGNOSIS — N170 Acute kidney failure with tubular necrosis: Secondary | ICD-10-CM | POA: Diagnosis not present

## 2019-06-29 DIAGNOSIS — E875 Hyperkalemia: Secondary | ICD-10-CM | POA: Diagnosis not present

## 2019-06-29 DIAGNOSIS — Y738 Miscellaneous gastroenterology and urology devices associated with adverse incidents, not elsewhere classified: Secondary | ICD-10-CM | POA: Diagnosis not present

## 2019-06-29 DIAGNOSIS — E538 Deficiency of other specified B group vitamins: Secondary | ICD-10-CM | POA: Diagnosis present

## 2019-06-29 DIAGNOSIS — R410 Disorientation, unspecified: Secondary | ICD-10-CM | POA: Diagnosis not present

## 2019-06-29 DIAGNOSIS — W06XXXA Fall from bed, initial encounter: Secondary | ICD-10-CM | POA: Diagnosis present

## 2019-06-29 DIAGNOSIS — T83031A Leakage of indwelling urethral catheter, initial encounter: Secondary | ICD-10-CM | POA: Diagnosis not present

## 2019-06-29 DIAGNOSIS — E119 Type 2 diabetes mellitus without complications: Secondary | ICD-10-CM

## 2019-06-29 DIAGNOSIS — W19XXXA Unspecified fall, initial encounter: Secondary | ICD-10-CM

## 2019-06-29 DIAGNOSIS — K7200 Acute and subacute hepatic failure without coma: Secondary | ICD-10-CM | POA: Diagnosis not present

## 2019-06-29 DIAGNOSIS — E1165 Type 2 diabetes mellitus with hyperglycemia: Secondary | ICD-10-CM | POA: Diagnosis present

## 2019-06-29 DIAGNOSIS — R140 Abdominal distension (gaseous): Secondary | ICD-10-CM

## 2019-06-29 DIAGNOSIS — I639 Cerebral infarction, unspecified: Secondary | ICD-10-CM | POA: Diagnosis not present

## 2019-06-29 DIAGNOSIS — G459 Transient cerebral ischemic attack, unspecified: Secondary | ICD-10-CM | POA: Diagnosis present

## 2019-06-29 DIAGNOSIS — H9190 Unspecified hearing loss, unspecified ear: Secondary | ICD-10-CM | POA: Diagnosis present

## 2019-06-29 DIAGNOSIS — E876 Hypokalemia: Secondary | ICD-10-CM | POA: Diagnosis not present

## 2019-06-29 DIAGNOSIS — E870 Hyperosmolality and hypernatremia: Secondary | ICD-10-CM | POA: Diagnosis not present

## 2019-06-29 DIAGNOSIS — R569 Unspecified convulsions: Secondary | ICD-10-CM | POA: Diagnosis present

## 2019-06-29 DIAGNOSIS — N179 Acute kidney failure, unspecified: Secondary | ICD-10-CM | POA: Diagnosis present

## 2019-06-29 DIAGNOSIS — I6521 Occlusion and stenosis of right carotid artery: Secondary | ICD-10-CM | POA: Diagnosis not present

## 2019-06-29 DIAGNOSIS — I4892 Unspecified atrial flutter: Secondary | ICD-10-CM | POA: Diagnosis not present

## 2019-06-29 DIAGNOSIS — R58 Hemorrhage, not elsewhere classified: Secondary | ICD-10-CM | POA: Diagnosis not present

## 2019-06-29 DIAGNOSIS — I10 Essential (primary) hypertension: Secondary | ICD-10-CM | POA: Diagnosis present

## 2019-06-29 DIAGNOSIS — A419 Sepsis, unspecified organism: Secondary | ICD-10-CM | POA: Diagnosis present

## 2019-06-29 DIAGNOSIS — G929 Unspecified toxic encephalopathy: Secondary | ICD-10-CM | POA: Diagnosis present

## 2019-06-29 DIAGNOSIS — Z66 Do not resuscitate: Secondary | ICD-10-CM | POA: Diagnosis not present

## 2019-06-29 DIAGNOSIS — Z681 Body mass index (BMI) 19 or less, adult: Secondary | ICD-10-CM

## 2019-06-29 DIAGNOSIS — Z20822 Contact with and (suspected) exposure to covid-19: Secondary | ICD-10-CM | POA: Diagnosis not present

## 2019-06-29 DIAGNOSIS — A4159 Other Gram-negative sepsis: Secondary | ICD-10-CM | POA: Diagnosis not present

## 2019-06-29 DIAGNOSIS — I483 Typical atrial flutter: Secondary | ICD-10-CM | POA: Diagnosis not present

## 2019-06-29 DIAGNOSIS — R29818 Other symptoms and signs involving the nervous system: Secondary | ICD-10-CM | POA: Diagnosis not present

## 2019-06-29 DIAGNOSIS — S2231XA Fracture of one rib, right side, initial encounter for closed fracture: Secondary | ICD-10-CM | POA: Diagnosis not present

## 2019-06-29 DIAGNOSIS — R29717 NIHSS score 17: Secondary | ICD-10-CM | POA: Diagnosis present

## 2019-06-29 DIAGNOSIS — R0902 Hypoxemia: Secondary | ICD-10-CM | POA: Diagnosis not present

## 2019-06-29 DIAGNOSIS — I6523 Occlusion and stenosis of bilateral carotid arteries: Secondary | ICD-10-CM | POA: Diagnosis not present

## 2019-06-29 DIAGNOSIS — J9811 Atelectasis: Secondary | ICD-10-CM | POA: Diagnosis not present

## 2019-06-29 DIAGNOSIS — K219 Gastro-esophageal reflux disease without esophagitis: Secondary | ICD-10-CM | POA: Diagnosis present

## 2019-06-29 DIAGNOSIS — E46 Unspecified protein-calorie malnutrition: Secondary | ICD-10-CM

## 2019-06-29 DIAGNOSIS — R4701 Aphasia: Secondary | ICD-10-CM | POA: Diagnosis not present

## 2019-06-29 DIAGNOSIS — I495 Sick sinus syndrome: Secondary | ICD-10-CM | POA: Diagnosis not present

## 2019-06-29 DIAGNOSIS — Y92013 Bedroom of single-family (private) house as the place of occurrence of the external cause: Secondary | ICD-10-CM

## 2019-06-29 DIAGNOSIS — R34 Anuria and oliguria: Secondary | ICD-10-CM | POA: Diagnosis not present

## 2019-06-29 DIAGNOSIS — K661 Hemoperitoneum: Secondary | ICD-10-CM | POA: Diagnosis not present

## 2019-06-29 DIAGNOSIS — G931 Anoxic brain damage, not elsewhere classified: Secondary | ICD-10-CM | POA: Diagnosis not present

## 2019-06-29 DIAGNOSIS — J690 Pneumonitis due to inhalation of food and vomit: Secondary | ICD-10-CM

## 2019-06-29 DIAGNOSIS — E43 Unspecified severe protein-calorie malnutrition: Secondary | ICD-10-CM | POA: Diagnosis not present

## 2019-06-29 DIAGNOSIS — I472 Ventricular tachycardia: Secondary | ICD-10-CM | POA: Diagnosis not present

## 2019-06-29 DIAGNOSIS — J9601 Acute respiratory failure with hypoxia: Secondary | ICD-10-CM | POA: Diagnosis not present

## 2019-06-29 DIAGNOSIS — D62 Acute posthemorrhagic anemia: Secondary | ICD-10-CM | POA: Diagnosis not present

## 2019-06-29 DIAGNOSIS — R918 Other nonspecific abnormal finding of lung field: Secondary | ICD-10-CM | POA: Diagnosis not present

## 2019-06-29 DIAGNOSIS — R6521 Severe sepsis with septic shock: Secondary | ICD-10-CM | POA: Diagnosis not present

## 2019-06-29 DIAGNOSIS — I469 Cardiac arrest, cause unspecified: Secondary | ICD-10-CM | POA: Diagnosis not present

## 2019-06-29 DIAGNOSIS — Z4682 Encounter for fitting and adjustment of non-vascular catheter: Secondary | ICD-10-CM | POA: Diagnosis not present

## 2019-06-29 DIAGNOSIS — Z515 Encounter for palliative care: Secondary | ICD-10-CM | POA: Diagnosis not present

## 2019-06-29 DIAGNOSIS — J9600 Acute respiratory failure, unspecified whether with hypoxia or hypercapnia: Secondary | ICD-10-CM

## 2019-06-29 DIAGNOSIS — R0989 Other specified symptoms and signs involving the circulatory and respiratory systems: Secondary | ICD-10-CM

## 2019-06-29 DIAGNOSIS — G92 Toxic encephalopathy: Secondary | ICD-10-CM | POA: Diagnosis not present

## 2019-06-29 DIAGNOSIS — J156 Pneumonia due to other aerobic Gram-negative bacteria: Secondary | ICD-10-CM | POA: Diagnosis not present

## 2019-06-29 DIAGNOSIS — R1310 Dysphagia, unspecified: Secondary | ICD-10-CM | POA: Diagnosis present

## 2019-06-29 DIAGNOSIS — I63532 Cerebral infarction due to unspecified occlusion or stenosis of left posterior cerebral artery: Secondary | ICD-10-CM | POA: Diagnosis not present

## 2019-06-29 DIAGNOSIS — Z03818 Encounter for observation for suspected exposure to other biological agents ruled out: Secondary | ICD-10-CM | POA: Diagnosis not present

## 2019-06-29 DIAGNOSIS — M6282 Rhabdomyolysis: Secondary | ICD-10-CM | POA: Diagnosis present

## 2019-06-29 DIAGNOSIS — I480 Paroxysmal atrial fibrillation: Secondary | ICD-10-CM | POA: Diagnosis not present

## 2019-06-29 DIAGNOSIS — R404 Transient alteration of awareness: Secondary | ICD-10-CM | POA: Diagnosis not present

## 2019-06-29 DIAGNOSIS — I4891 Unspecified atrial fibrillation: Secondary | ICD-10-CM | POA: Diagnosis not present

## 2019-06-29 DIAGNOSIS — E785 Hyperlipidemia, unspecified: Secondary | ICD-10-CM | POA: Diagnosis present

## 2019-06-29 DIAGNOSIS — K8020 Calculus of gallbladder without cholecystitis without obstruction: Secondary | ICD-10-CM | POA: Diagnosis not present

## 2019-06-29 DIAGNOSIS — Z9911 Dependence on respirator [ventilator] status: Secondary | ICD-10-CM | POA: Diagnosis not present

## 2019-06-29 DIAGNOSIS — R7401 Elevation of levels of liver transaminase levels: Secondary | ICD-10-CM | POA: Diagnosis present

## 2019-06-29 DIAGNOSIS — R401 Stupor: Secondary | ICD-10-CM | POA: Diagnosis not present

## 2019-06-29 HISTORY — DX: Essential (primary) hypertension: I10

## 2019-06-29 HISTORY — DX: Type 2 diabetes mellitus without complications: E11.9

## 2019-06-29 LAB — CBC
HCT: 39.7 % (ref 39.0–52.0)
HCT: 39.8 % (ref 39.0–52.0)
Hemoglobin: 13.2 g/dL (ref 13.0–17.0)
Hemoglobin: 13.5 g/dL (ref 13.0–17.0)
MCH: 29.5 pg (ref 26.0–34.0)
MCH: 29.5 pg (ref 26.0–34.0)
MCHC: 33.2 g/dL (ref 30.0–36.0)
MCHC: 33.9 g/dL (ref 30.0–36.0)
MCV: 88.8 fL (ref 80.0–100.0)
MCV: 87.1 fL (ref 80.0–100.0)
Platelets: 258 10*3/uL (ref 150–400)
Platelets: 269 10*3/uL (ref 150–400)
RBC: 4.47 MIL/uL (ref 4.22–5.81)
RBC: 4.57 MIL/uL (ref 4.22–5.81)
RDW: 12.1 % (ref 11.5–15.5)
RDW: 12.1 % (ref 11.5–15.5)
WBC: 12.2 10*3/uL — ABNORMAL HIGH (ref 4.0–10.5)
WBC: 12.7 10*3/uL — ABNORMAL HIGH (ref 4.0–10.5)
nRBC: 0 % (ref 0.0–0.2)
nRBC: 0 % (ref 0.0–0.2)

## 2019-06-29 LAB — RAPID URINE DRUG SCREEN, HOSP PERFORMED
Amphetamines: NOT DETECTED
Barbiturates: NOT DETECTED
Benzodiazepines: NOT DETECTED
Cocaine: NOT DETECTED
Opiates: NOT DETECTED
Tetrahydrocannabinol: NOT DETECTED

## 2019-06-29 LAB — DIFFERENTIAL
Abs Immature Granulocytes: 0.05 10*3/uL (ref 0.00–0.07)
Basophils Absolute: 0 10*3/uL (ref 0.0–0.1)
Basophils Relative: 0 %
Eosinophils Absolute: 0 10*3/uL (ref 0.0–0.5)
Eosinophils Relative: 0 %
Immature Granulocytes: 0 %
Lymphocytes Relative: 2 %
Lymphs Abs: 0.3 10*3/uL — ABNORMAL LOW (ref 0.7–4.0)
Monocytes Absolute: 1.1 10*3/uL — ABNORMAL HIGH (ref 0.1–1.0)
Monocytes Relative: 9 %
Neutro Abs: 10.8 10*3/uL — ABNORMAL HIGH (ref 1.7–7.7)
Neutrophils Relative %: 89 %

## 2019-06-29 LAB — POCT I-STAT 7, (LYTES, BLD GAS, ICA,H+H)
Acid-Base Excess: 4 mmol/L — ABNORMAL HIGH (ref 0.0–2.0)
Acid-Base Excess: 3 mmol/L — ABNORMAL HIGH (ref 0.0–2.0)
Acid-Base Excess: 3 mmol/L — ABNORMAL HIGH (ref 0.0–2.0)
Bicarbonate: 27 mmol/L (ref 20.0–28.0)
Bicarbonate: 27.1 mmol/L (ref 20.0–28.0)
Bicarbonate: 25 mmol/L (ref 20.0–28.0)
Calcium, Ion: 1.2 mmol/L (ref 1.15–1.40)
Calcium, Ion: 1.2 mmol/L (ref 1.15–1.40)
Calcium, Ion: 1.16 mmol/L (ref 1.15–1.40)
HCT: 39 % (ref 39.0–52.0)
HCT: 40 % (ref 39.0–52.0)
HCT: 37 % — ABNORMAL LOW (ref 39.0–52.0)
Hemoglobin: 13.3 g/dL (ref 13.0–17.0)
Hemoglobin: 13.6 g/dL (ref 13.0–17.0)
Hemoglobin: 12.6 g/dL — ABNORMAL LOW (ref 13.0–17.0)
O2 Saturation: 95 %
O2 Saturation: 96 %
O2 Saturation: 100 %
Patient temperature: 98.6
Patient temperature: 98
Patient temperature: 98
Potassium: 3 mmol/L — ABNORMAL LOW (ref 3.5–5.1)
Potassium: 3 mmol/L — ABNORMAL LOW (ref 3.5–5.1)
Potassium: 2.6 mmol/L — CL (ref 3.5–5.1)
Sodium: 135 mmol/L (ref 135–145)
Sodium: 136 mmol/L (ref 135–145)
Sodium: 138 mmol/L (ref 135–145)
TCO2: 28 mmol/L (ref 22–32)
TCO2: 28 mmol/L (ref 22–32)
TCO2: 26 mmol/L (ref 22–32)
pCO2 arterial: 33.6 mmHg (ref 32.0–48.0)
pCO2 arterial: 36.9 mmHg (ref 32.0–48.0)
pCO2 arterial: 30.2 mmHg — ABNORMAL LOW (ref 32.0–48.0)
pH, Arterial: 7.512 — ABNORMAL HIGH (ref 7.350–7.450)
pH, Arterial: 7.472 — ABNORMAL HIGH (ref 7.350–7.450)
pH, Arterial: 7.524 — ABNORMAL HIGH (ref 7.350–7.450)
pO2, Arterial: 70 mmHg — ABNORMAL LOW (ref 83.0–108.0)
pO2, Arterial: 75 mmHg — ABNORMAL LOW (ref 83.0–108.0)
pO2, Arterial: 145 mmHg — ABNORMAL HIGH (ref 83.0–108.0)

## 2019-06-29 LAB — CREATININE, SERUM
Creatinine, Ser: 0.65 mg/dL (ref 0.61–1.24)
GFR calc Af Amer: >60 mL/min (ref >60)
GFR calc non Af Amer: >60 mL/min (ref >60)

## 2019-06-29 LAB — COMPREHENSIVE METABOLIC PANEL
ALT: 43 U/L (ref 0–44)
ALT: 42 U/L (ref 0–44)
AST: 75 U/L — ABNORMAL HIGH (ref 15–41)
AST: 79 U/L — ABNORMAL HIGH (ref 15–41)
Albumin: 4.2 g/dL (ref 3.5–5.0)
Albumin: 3.4 g/dL — ABNORMAL LOW (ref 3.5–5.0)
Alkaline Phosphatase: 100 U/L (ref 38–126)
Alkaline Phosphatase: 86 U/L (ref 38–126)
Anion gap: 15 (ref 5–15)
Anion gap: 11 (ref 5–15)
BUN: 14 mg/dL (ref 8–23)
BUN: 12 mg/dL (ref 8–23)
CO2: 25 mmol/L (ref 22–32)
CO2: 24 mmol/L (ref 22–32)
Calcium: 9.5 mg/dL (ref 8.9–10.3)
Calcium: 8.9 mg/dL (ref 8.9–10.3)
Chloride: 95 mmol/L — ABNORMAL LOW (ref 98–111)
Chloride: 103 mmol/L (ref 98–111)
Creatinine, Ser: 0.78 mg/dL (ref 0.61–1.24)
Creatinine, Ser: 0.65 mg/dL (ref 0.61–1.24)
GFR calc Af Amer: >60 mL/min (ref >60)
GFR calc Af Amer: >60 mL/min (ref >60)
GFR calc non Af Amer: >60 mL/min (ref >60)
GFR calc non Af Amer: >60 mL/min (ref >60)
Glucose, Bld: 227 mg/dL — ABNORMAL HIGH (ref 70–99)
Glucose, Bld: 144 mg/dL — ABNORMAL HIGH (ref 70–99)
Potassium: 3.8 mmol/L (ref 3.5–5.1)
Potassium: 2.7 mmol/L — CL (ref 3.5–5.1)
Sodium: 135 mmol/L (ref 135–145)
Sodium: 138 mmol/L (ref 135–145)
Total Bilirubin: 0.6 mg/dL (ref 0.3–1.2)
Total Bilirubin: 1.2 mg/dL (ref 0.3–1.2)
Total Protein: 8 g/dL (ref 6.5–8.1)
Total Protein: 6.5 g/dL (ref 6.5–8.1)

## 2019-06-29 LAB — PROTIME-INR
INR: 1.2 (ref 0.8–1.2)
Prothrombin Time: 14.8 seconds (ref 11.4–15.2)

## 2019-06-29 LAB — I-STAT CHEM 8, ED
BUN: 16 mg/dL (ref 8–23)
Calcium, Ion: 1.07 mmol/L — ABNORMAL LOW (ref 1.15–1.40)
Chloride: 96 mmol/L — ABNORMAL LOW (ref 98–111)
Creatinine, Ser: 0.5 mg/dL — ABNORMAL LOW (ref 0.61–1.24)
Glucose, Bld: 225 mg/dL — ABNORMAL HIGH (ref 70–99)
HCT: 41 % (ref 39.0–52.0)
Hemoglobin: 13.9 g/dL (ref 13.0–17.0)
Potassium: 3.8 mmol/L (ref 3.5–5.1)
Sodium: 135 mmol/L (ref 135–145)
TCO2: 28 mmol/L (ref 22–32)

## 2019-06-29 LAB — BASIC METABOLIC PANEL
Anion gap: 15 (ref 5–15)
BUN: 16 mg/dL (ref 8–23)
CO2: 25 mmol/L (ref 22–32)
Calcium: 9.4 mg/dL (ref 8.9–10.3)
Chloride: 98 mmol/L (ref 98–111)
Creatinine, Ser: 0.67 mg/dL (ref 0.61–1.24)
GFR calc Af Amer: >60 mL/min (ref >60)
GFR calc non Af Amer: >60 mL/min (ref >60)
Glucose, Bld: 157 mg/dL — ABNORMAL HIGH (ref 70–99)
Potassium: 3.1 mmol/L — ABNORMAL LOW (ref 3.5–5.1)
Sodium: 138 mmol/L (ref 135–145)

## 2019-06-29 LAB — CBG MONITORING, ED: Glucose-Capillary: 220 mg/dL — ABNORMAL HIGH (ref 70–99)

## 2019-06-29 LAB — LACTIC ACID, PLASMA: Lactic Acid, Venous: 1.9 mmol/L (ref 0.5–1.9)

## 2019-06-29 LAB — URINALYSIS, ROUTINE W REFLEX MICROSCOPIC
Bacteria, UA: NONE SEEN
Bilirubin Urine: NEGATIVE
Glucose, UA: >=500 mg/dL — AB
Ketones, ur: 20 mg/dL — AB
Leukocytes,Ua: NEGATIVE
Nitrite: NEGATIVE
Protein, ur: 100 mg/dL — AB
Specific Gravity, Urine: 1.026 (ref 1.005–1.030)
pH: 6 (ref 5.0–8.0)

## 2019-06-29 LAB — PHOSPHORUS: Phosphorus: 2.6 mg/dL (ref 2.5–4.6)

## 2019-06-29 LAB — BRAIN NATRIURETIC PEPTIDE: B Natriuretic Peptide: 745.7 pg/mL — ABNORMAL HIGH (ref 0.0–100.0)

## 2019-06-29 LAB — AMMONIA: Ammonia: 11 umol/L (ref 9–35)

## 2019-06-29 LAB — MAGNESIUM: Magnesium: 1.5 mg/dL — ABNORMAL LOW (ref 1.7–2.4)

## 2019-06-29 LAB — CK TOTAL AND CKMB (NOT AT ARMC)
CK, MB: 109.1 ng/mL — ABNORMAL HIGH (ref 0.5–5.0)
Relative Index: 3.3 — ABNORMAL HIGH (ref 0.0–2.5)
Total CK: 3313 U/L — ABNORMAL HIGH (ref 49–397)

## 2019-06-29 LAB — PROCALCITONIN: Procalcitonin: 0.14 ng/mL

## 2019-06-29 LAB — RESPIRATORY PANEL BY RT PCR (FLU A&B, COVID)
Influenza A by PCR: NEGATIVE
Influenza B by PCR: NEGATIVE
SARS Coronavirus 2 by RT PCR: NEGATIVE

## 2019-06-29 LAB — MRSA PCR SCREENING: MRSA by PCR: NEGATIVE

## 2019-06-29 LAB — GLUCOSE, CAPILLARY: Glucose-Capillary: 147 mg/dL — ABNORMAL HIGH (ref 70–99)

## 2019-06-29 LAB — APTT: aPTT: 29 seconds (ref 24–36)

## 2019-06-29 IMAGING — DX DG CHEST 1V PORT
1 series · 1 of 1 positions shown · non-contrast
Comparison: [DATE]

CLINICAL DATA: Endotracheal tube placement

EXAM:
PORTABLE CHEST 1 VIEW

[chest ap]
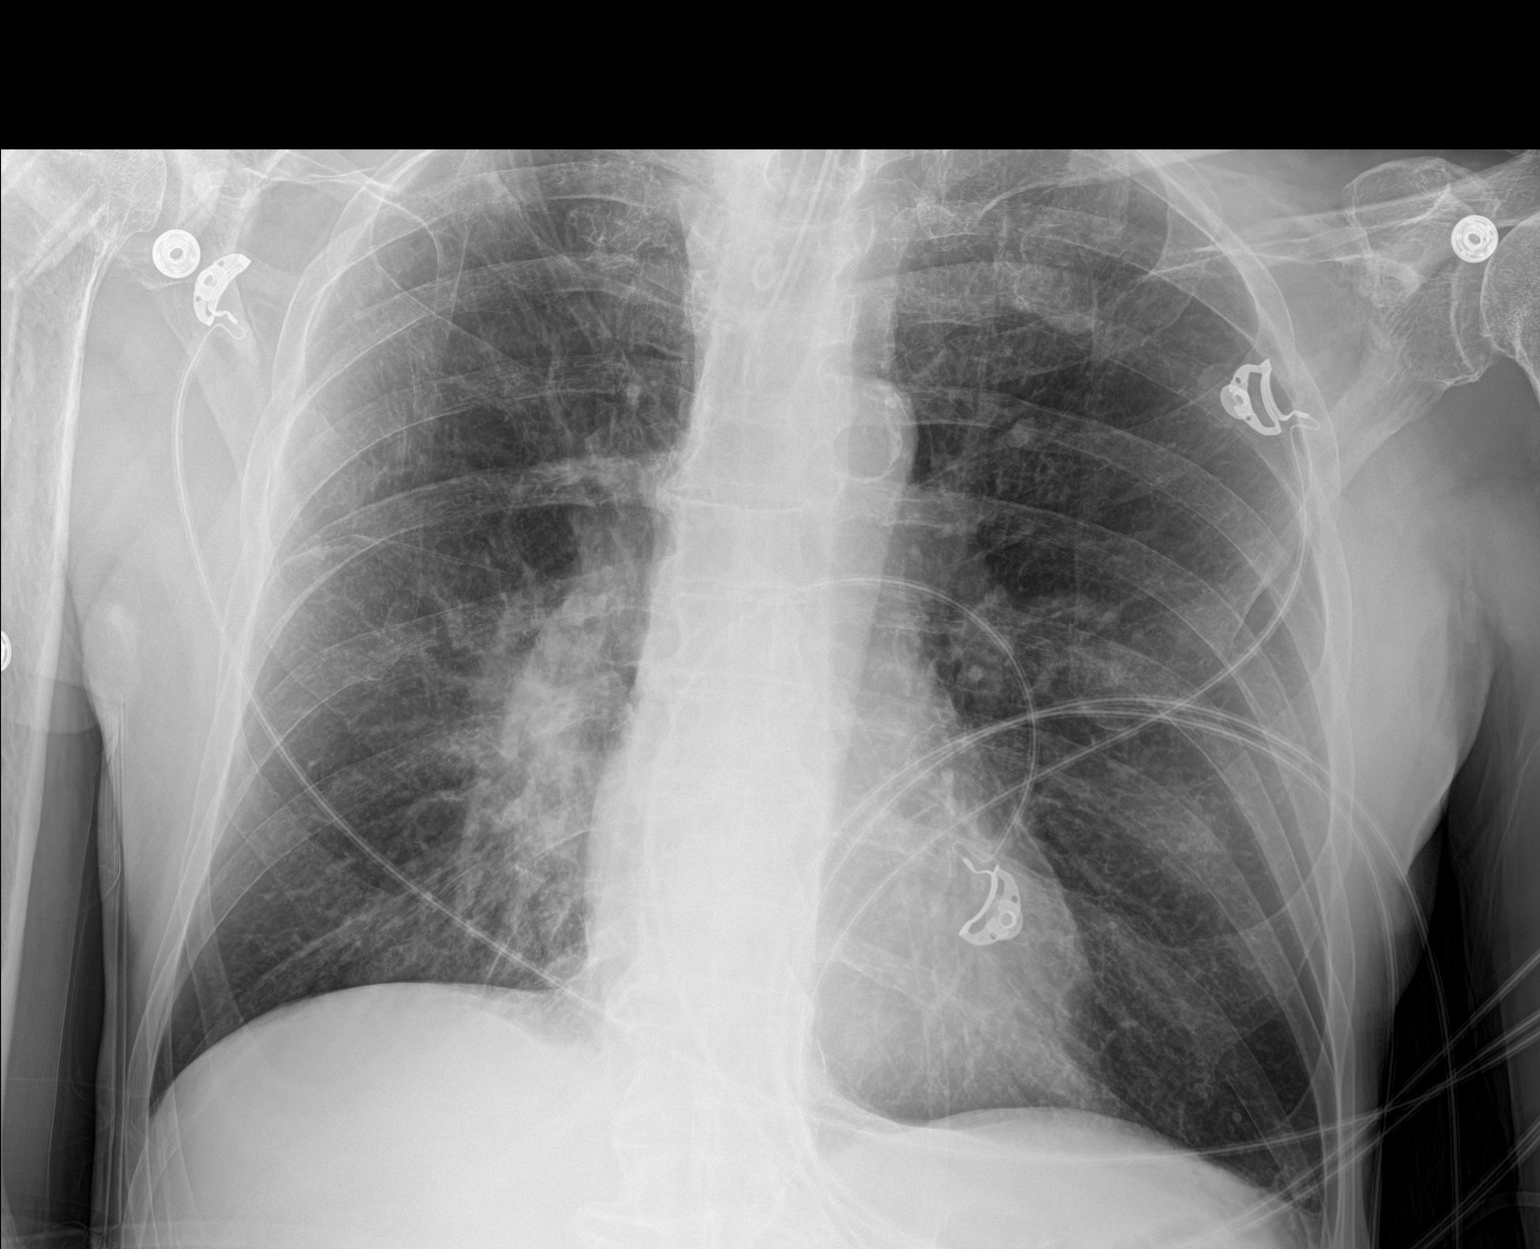

[1 of 1 positions shown; findings below may reference images not displayed]

FINDINGS: The endotracheal tube terminates above the carina by approximately
4.8 cm. There is an infrahilar airspace opacity on the right. There
is no pneumothorax. No large pleural effusion. There is no acute
osseous abnormality. The heart size is stable. Aortic calcifications
are noted.
IMPRESSION: 1. Endotracheal tube as above.
2. Developing right infrahilar airspace opacity concerning for
pneumonia.

## 2019-06-29 IMAGING — CT CT HEAD CODE STROKE
4 series · 16 of 47 positions shown, 18 images · non-contrast
Comparison: [DATE]

CLINICAL DATA: Code stroke.  Right leg weakness.  Aphasia.

EXAM:
CT HEAD WITHOUT CONTRAST
TECHNIQUE: Contiguous axial images were obtained from the base of the skull
through the vertex without intravenous contrast.

[Series 3: head wo · axial · 0.40mm/px · z∈[-177,-47]mm · 7 of 36 slices shown, 9 images]
[im 5/36  brain]
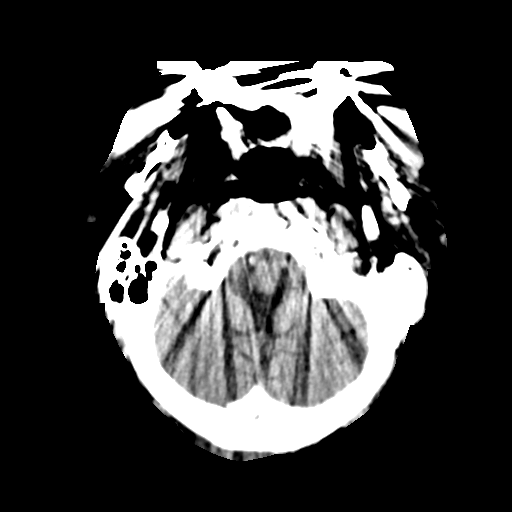
[im 5/36  bone]
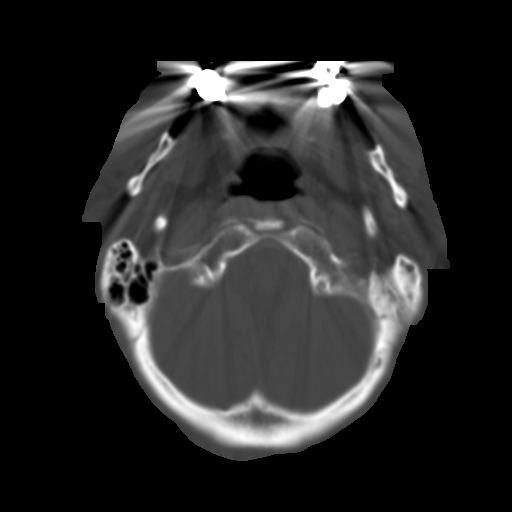
[im 9/36  brain]
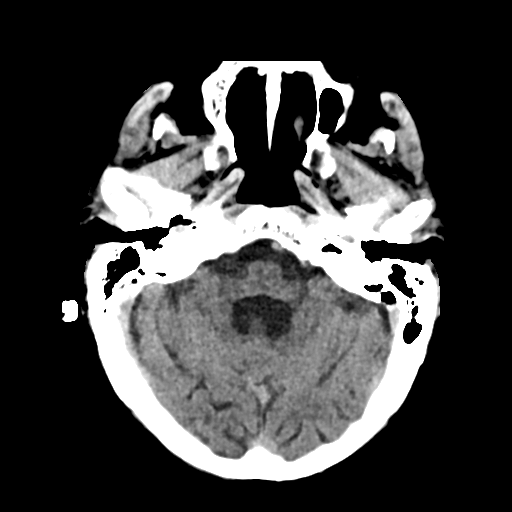
[im 14/36  brain]
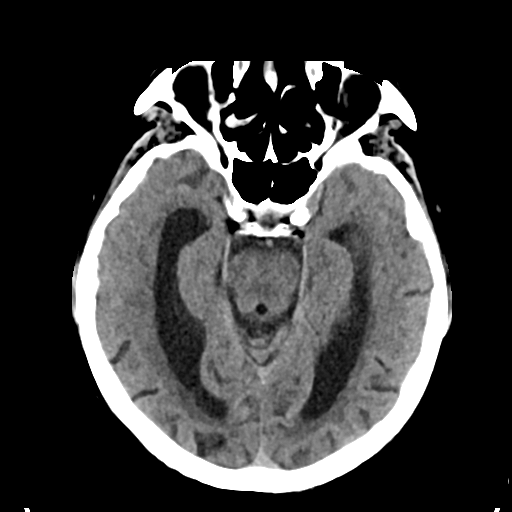
[im 18/36  brain]
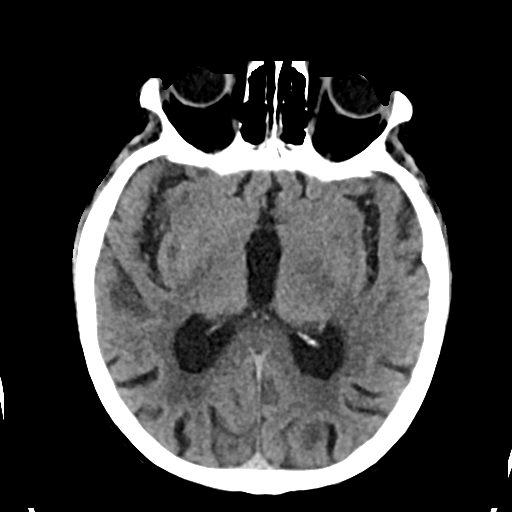
[im 22/36  brain]
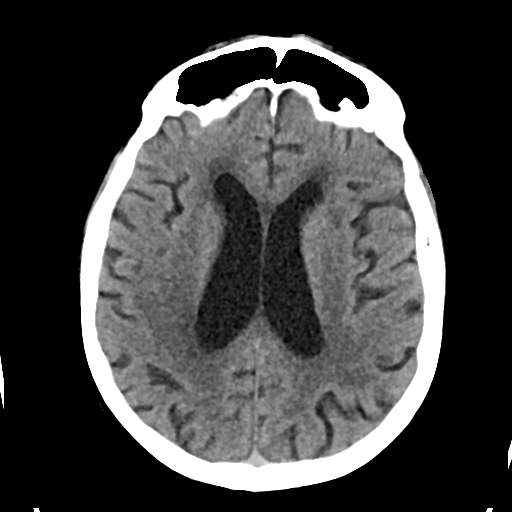
[im 22/36  bone]
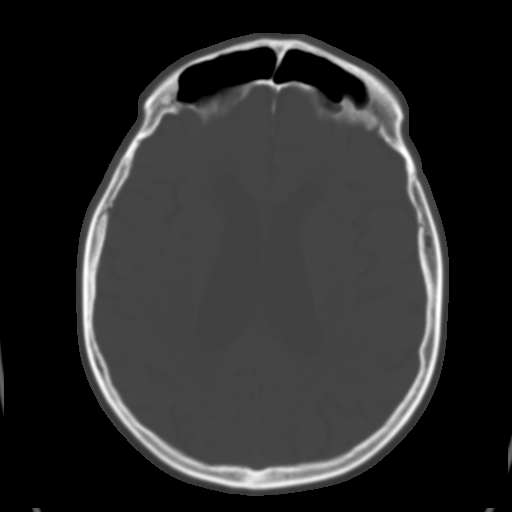
[im 27/36  brain]
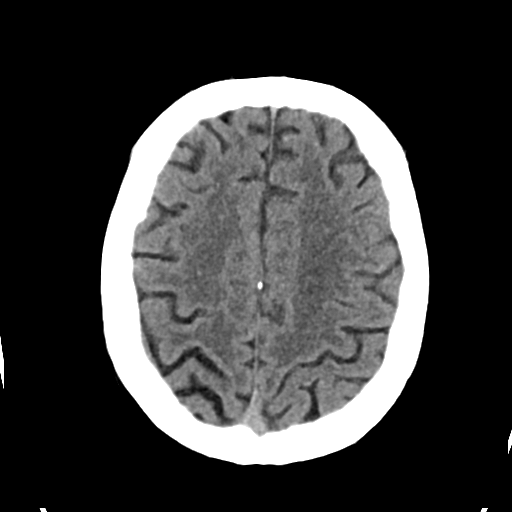
[im 31/36  brain]
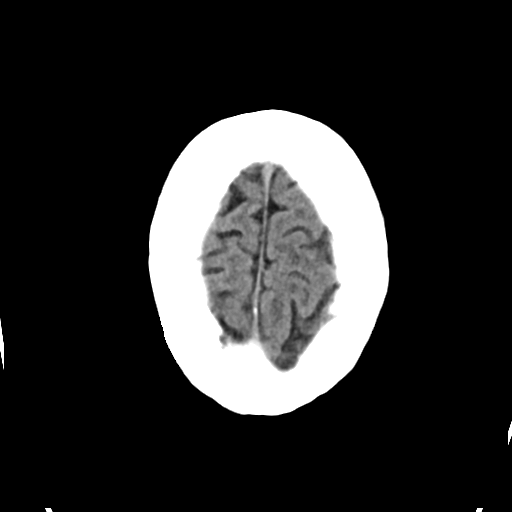

[Series 4: head bone · axial · 0.40mm/px · z∈[-181,-145]mm · 3 of 90 slices shown]
[im 9/90  bone]
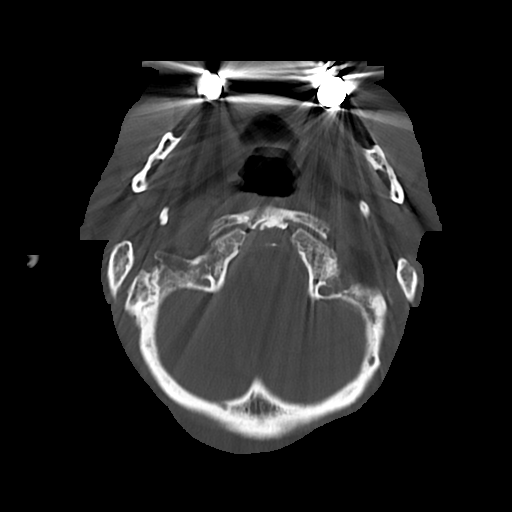
[im 18/90  bone]
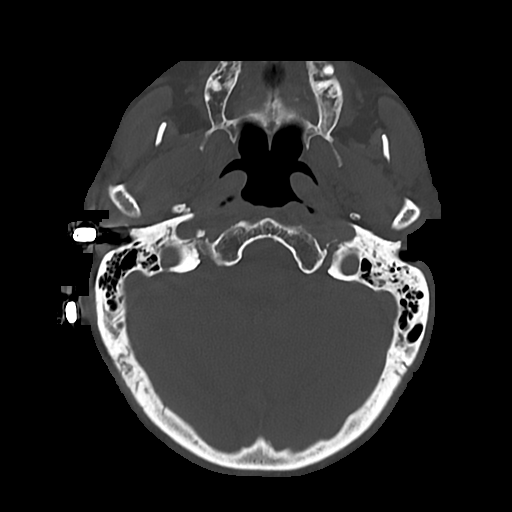
[im 27/90  bone]
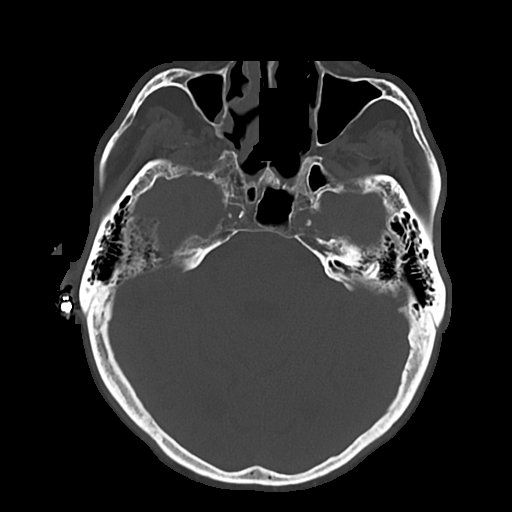

[Series 5: cor soft · coronal · 0.33mm/px · 3 of 69 slices shown]
[im 24/69  brain]
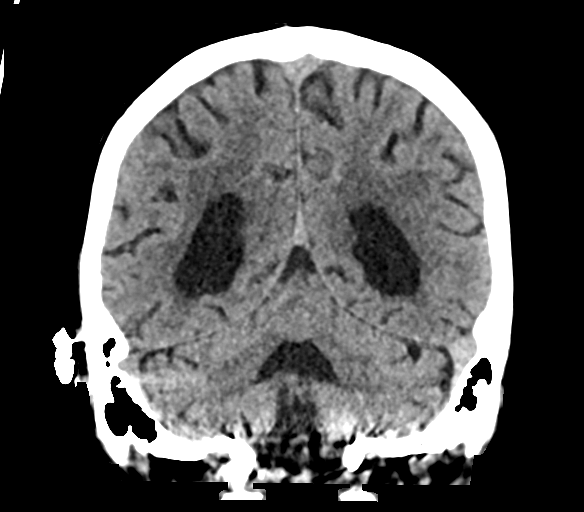
[im 31/69  brain]
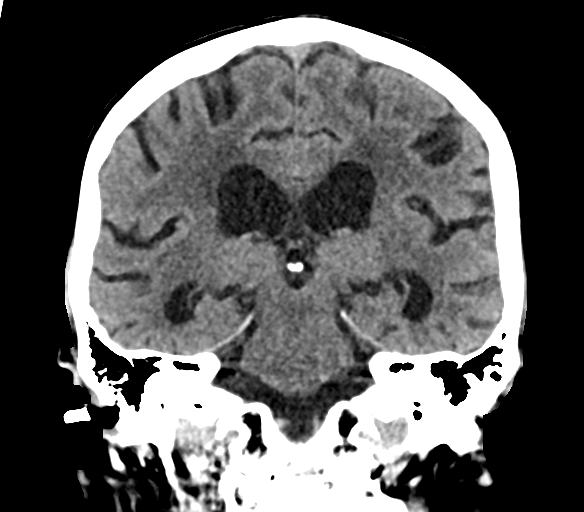
[im 38/69  brain]
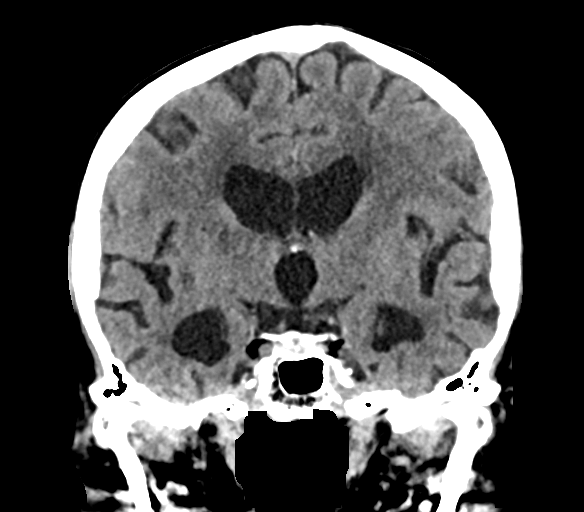

[Series 6: sag soft · sagittal · 0.33mm/px · 3 of 66 slices shown]
[im 22/66  brain]
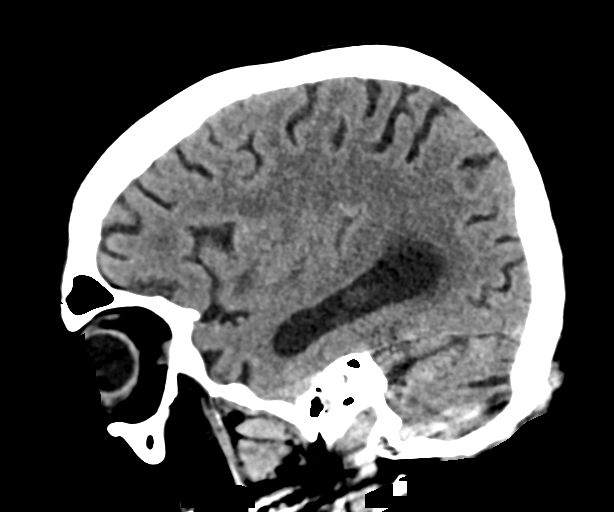
[im 33/66  brain]
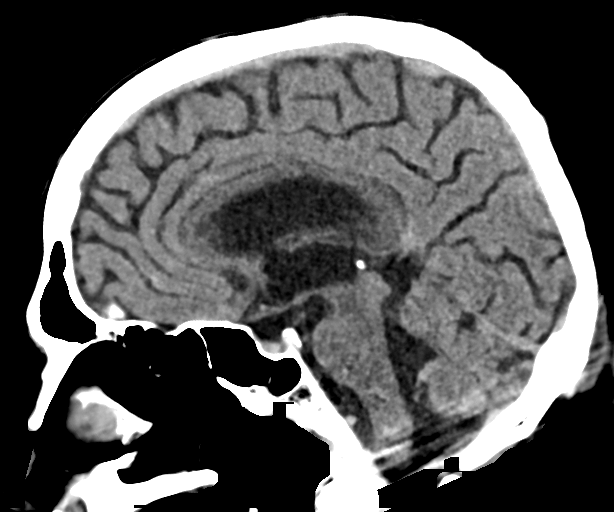
[im 44/66  brain]
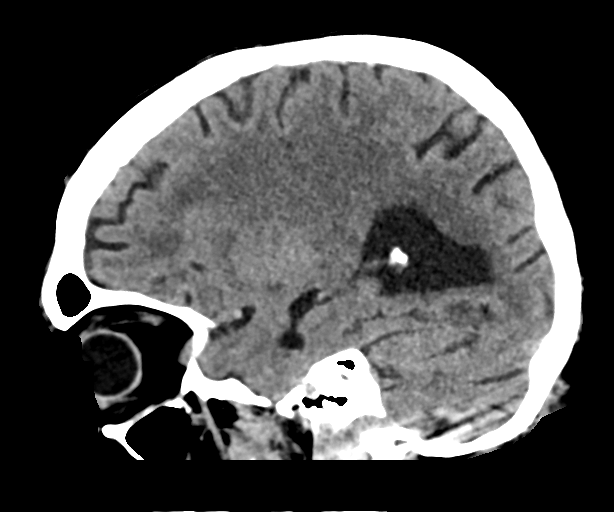

[16 of 47 positions shown; findings below may reference images not displayed]

FINDINGS: Brain: Generalized atrophy. Chronic small-vessel ischemic changes
affecting the cerebral hemispheric white matter. No sign of acute
infarction, mass lesion, hemorrhage, hydrocephalus or extra-axial
collection.

Vascular: There is atherosclerotic calcification of the major
vessels at the base of the brain.

Skull: Negative

Sinuses/Orbits: Clear/normal

Other: None

ASPECTS (Alberta Stroke Program Early CT Score)

- Ganglionic level infarction (caudate, lentiform nuclei, internal
capsule, insula, M1-M3 cortex): 7

- Supraganglionic infarction (M4-M6 cortex): 3

Total score (0-10 with 10 being normal): 10
IMPRESSION: 1. No acute finding. Atrophy and chronic small-vessel ischemic
changes.
2. ASPECTS is 10
3. These results were communicated to Dr. THACKER at [DATE] THACKER
[DATE]by text page via the [employer] messaging system.

## 2019-06-29 IMAGING — CT CT CERVICAL SPINE W/O CM
2 series · 15 of 20 positions shown, 18 images · non-contrast
Comparison: [DATE]

CLINICAL DATA: Fall.  Neurological deficit.

EXAM:
CT CERVICAL SPINE WITHOUT CONTRAST
TECHNIQUE: Multidetector CT imaging of the cervical spine was performed without
intravenous contrast. Multiplanar CT image reconstructions were also
generated.

[Series 1: c-spine · axial · 0.43mm/px · z∈[-327,-173]mm · 12 of 91 slices shown, 15 images]
[im 7/91  soft-tissue]
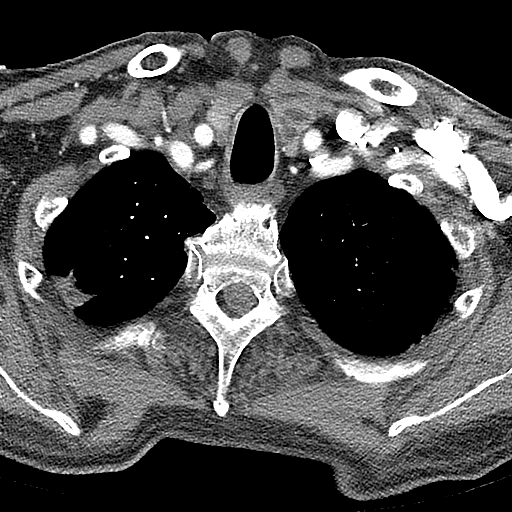
[im 7/91  bone]
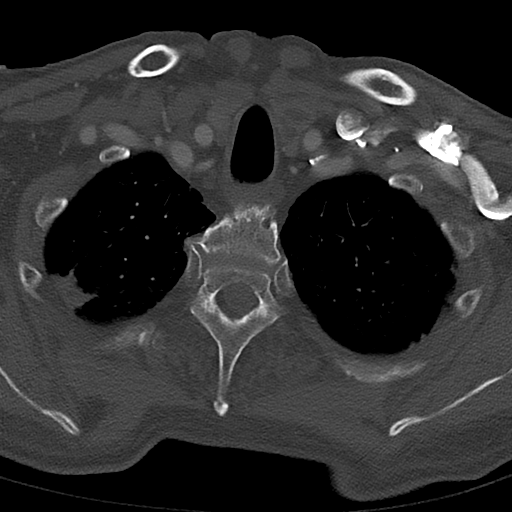
[im 14/91  bone]
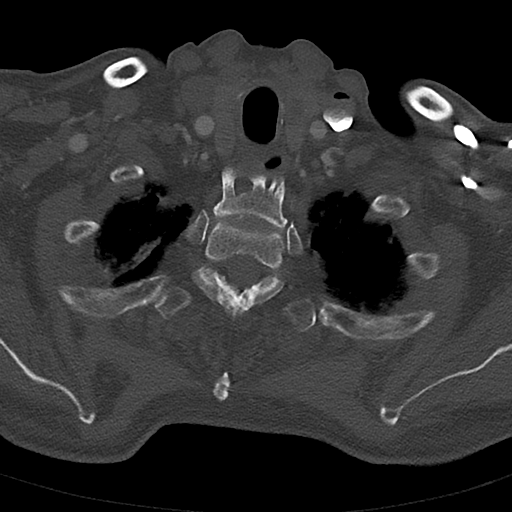
[im 21/91  bone]
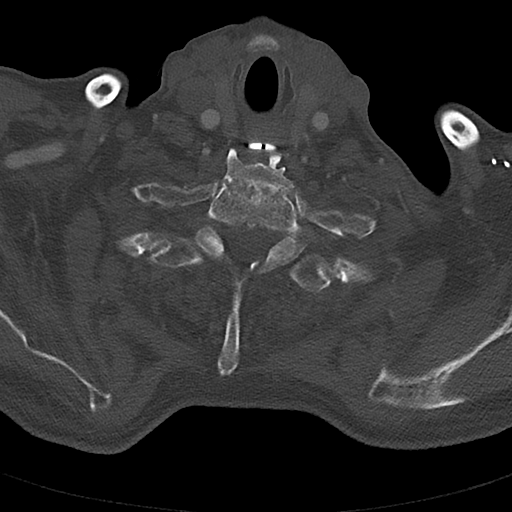
[im 28/91  bone]
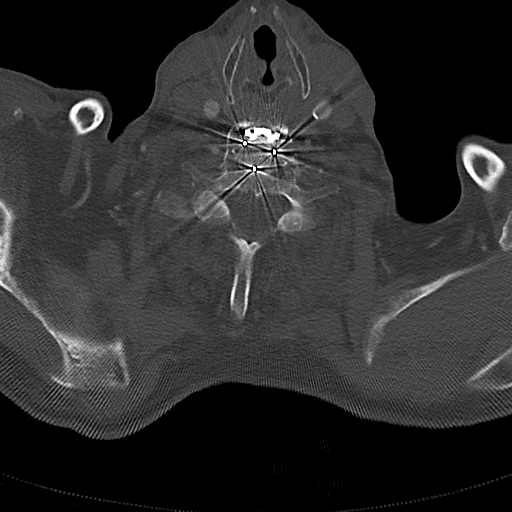
[im 35/91  soft-tissue]
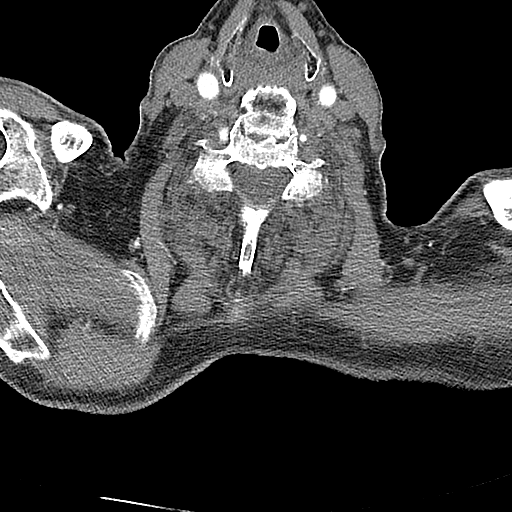
[im 35/91  bone]
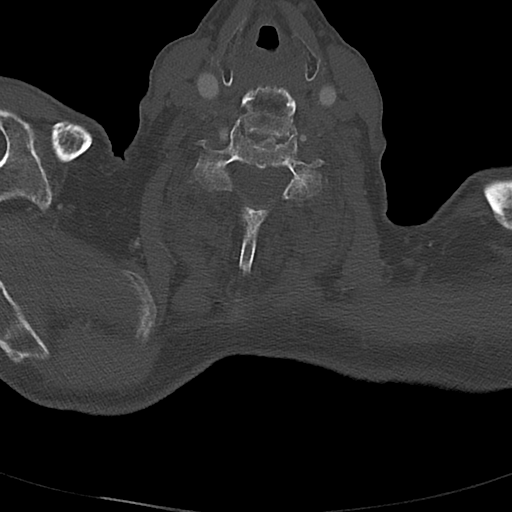
[im 42/91  bone]
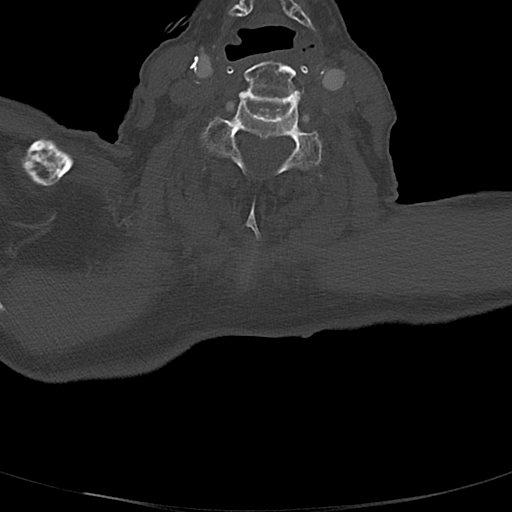
[im 49/91  bone]
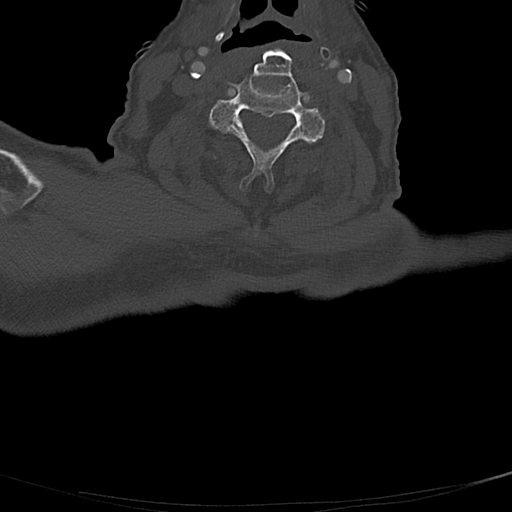
[im 56/91  bone]
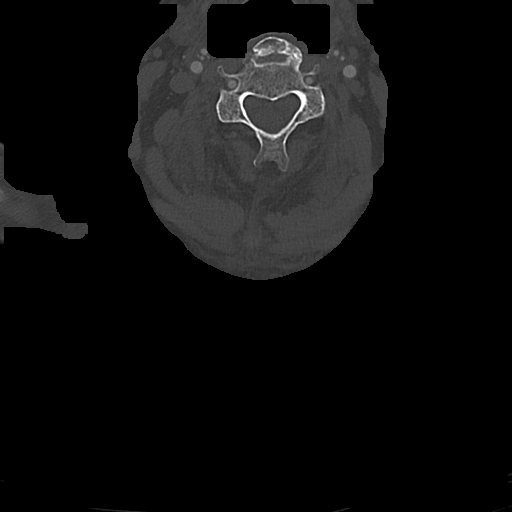
[im 63/91  soft-tissue]
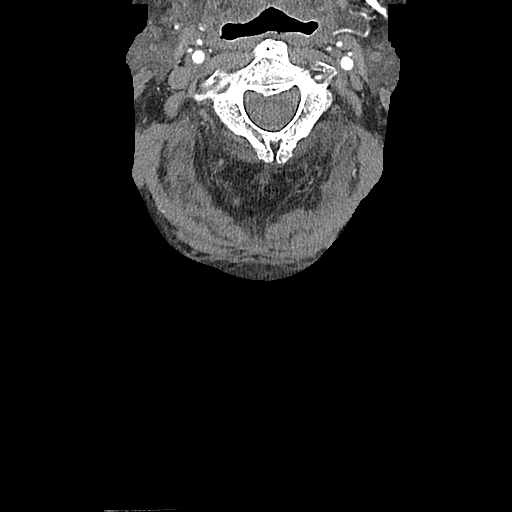
[im 63/91  bone]
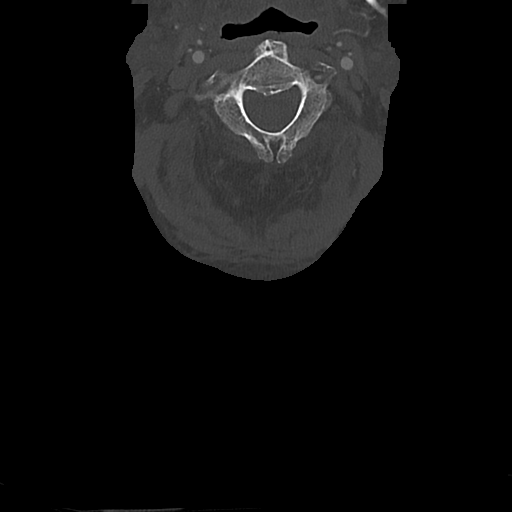
[im 70/91  bone]
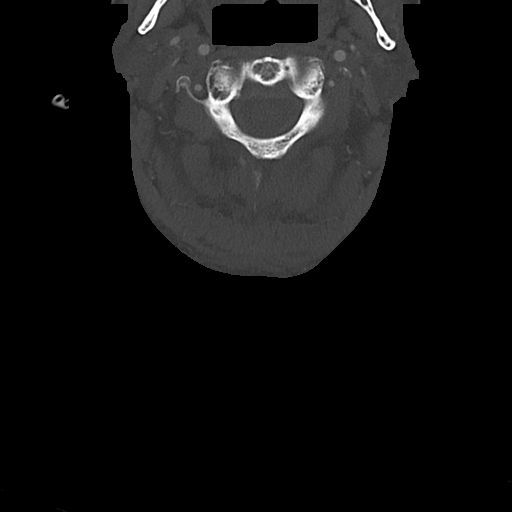
[im 77/91  bone]
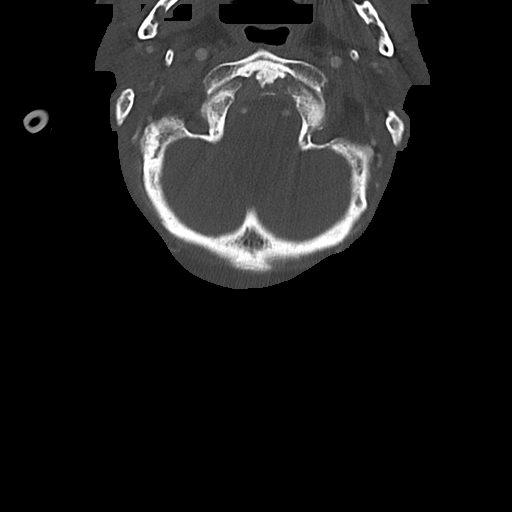
[im 84/91  bone]
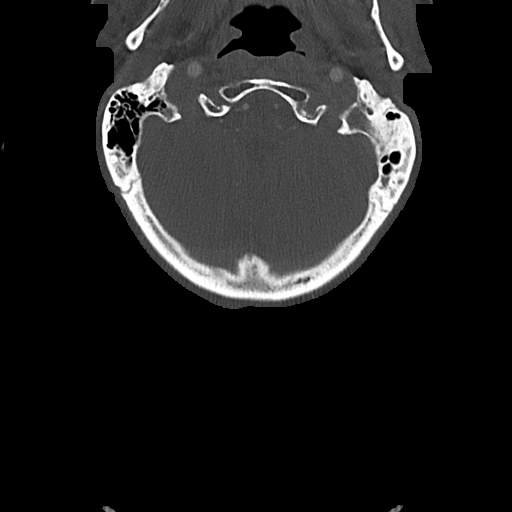

[Series 5: c-spine sag · coronal · 0.32mm/px · 3 of 87 slices shown]
[im 18/87  bone]
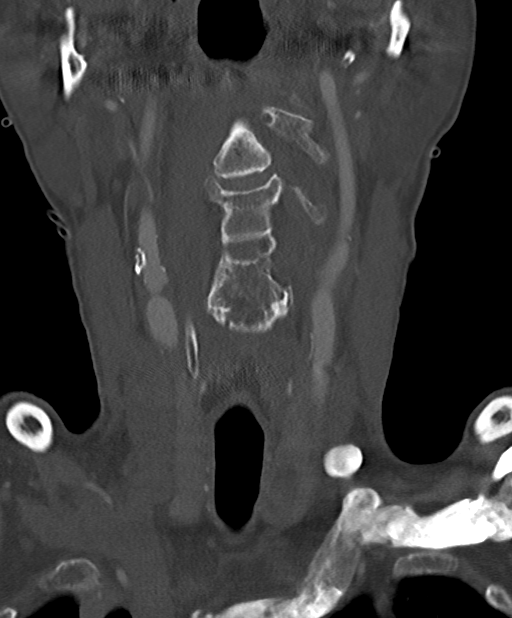
[im 35/87  bone]
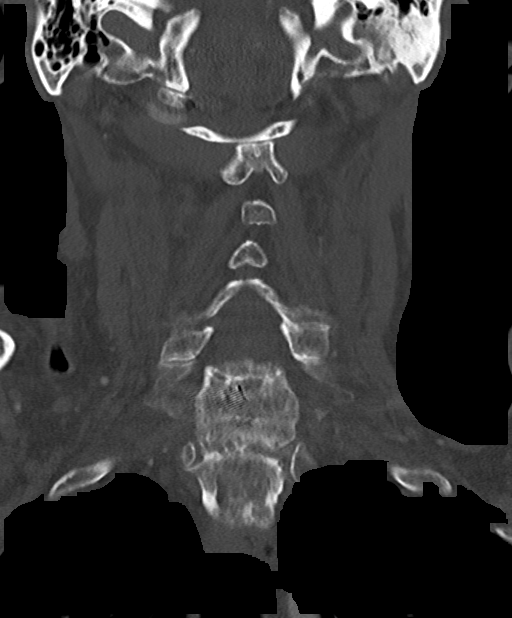
[im 52/87  bone]
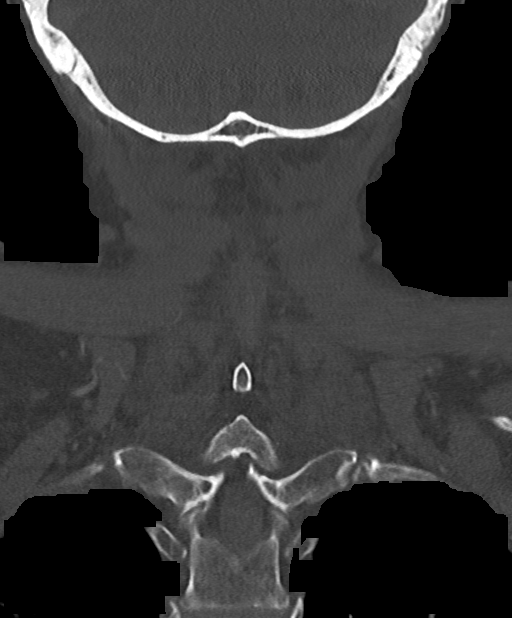

[15 of 20 positions shown; findings below may reference images not displayed]

FINDINGS: Alignment: No malalignment.

Skull base and vertebrae: No fracture.

Soft tissues and spinal canal: Negative

Disc levels: Solid bridging anterior osteophytes from C2 through the
upper thoracic region. Previous ACDF C6-7 appears solid. No canal
stenosis. No significant foraminal stenosis.

Upper chest: Negative for acute disease. Pleural and parenchymal
scarring at the right apex.

Other: Pronounced sternoclavicular joint degenerative changes right
more than left.
IMPRESSION: No traumatic cervical finding. Fusion/ankylosis of the spine from C2
into the thoracic region.

## 2019-06-29 IMAGING — DX DG ABDOMEN 1V
2 series · 2 of 2 positions shown · non-contrast
Comparison: None.

CLINICAL DATA: Abdominal distension.

EXAM:
ABDOMEN - 1 VIEW

[abdomen kub (1 of 2)]
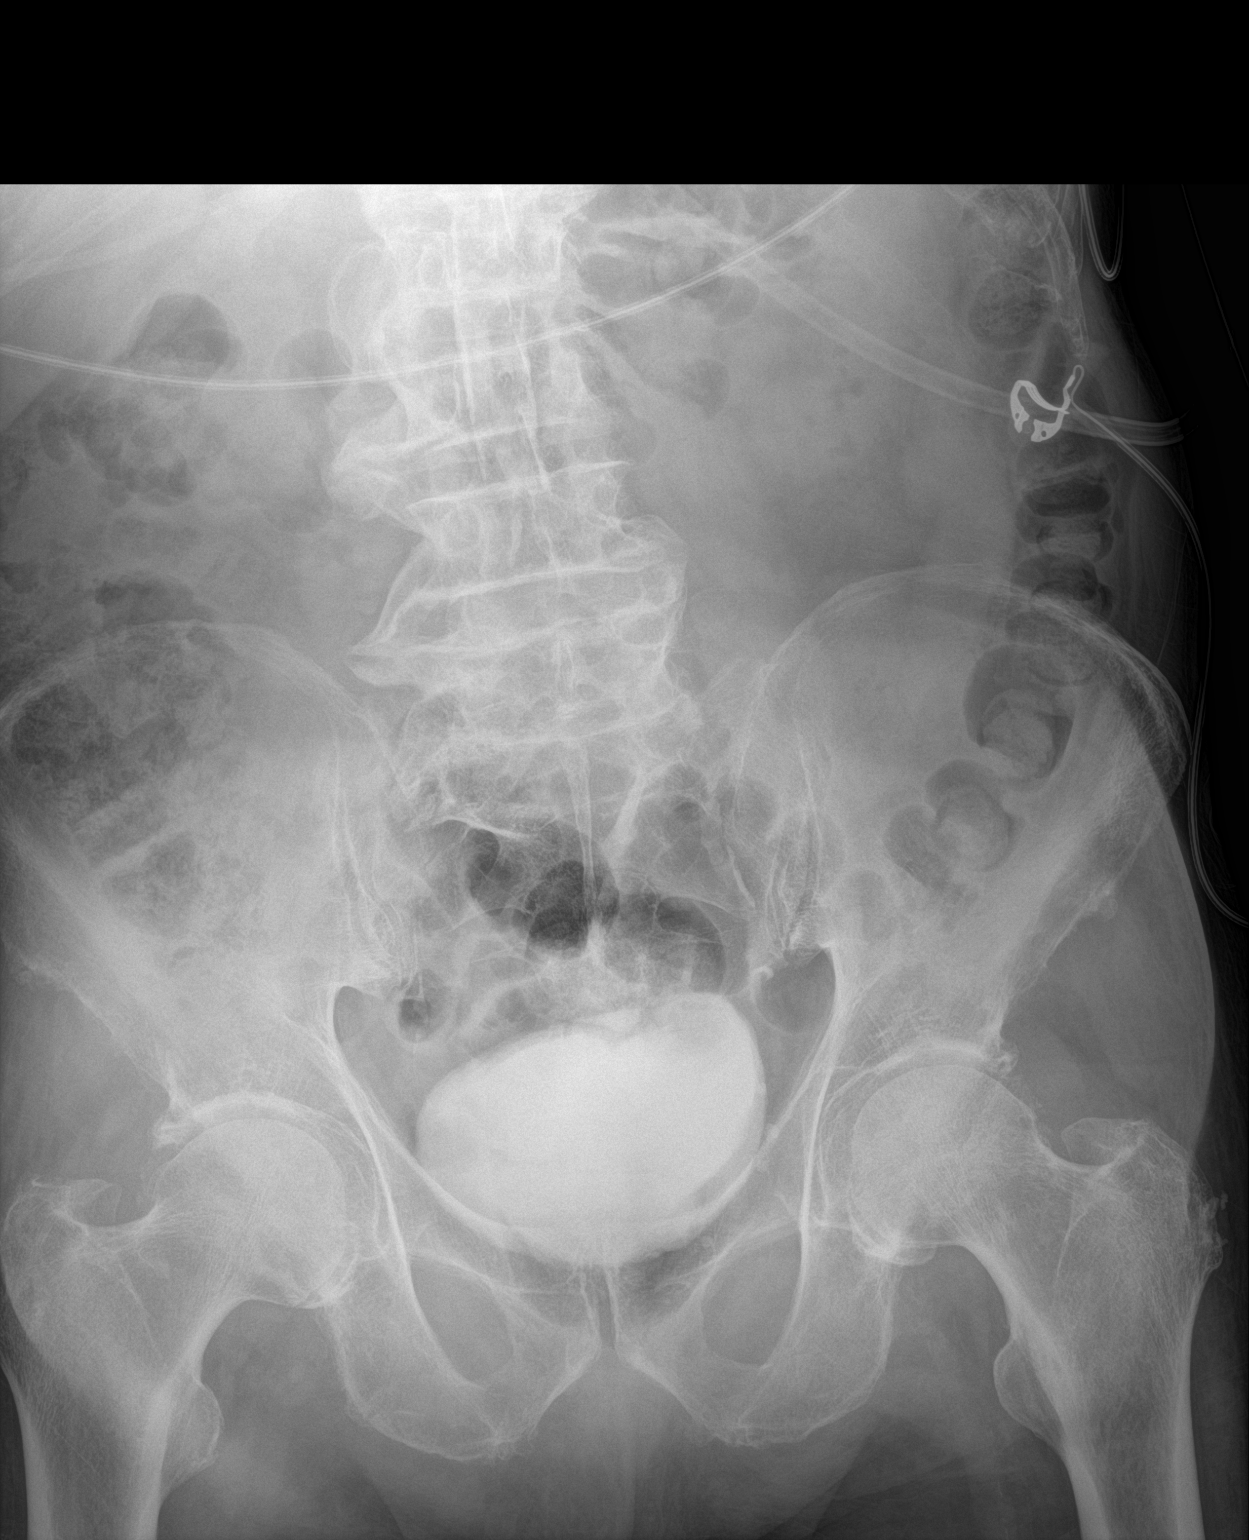

[abdomen kub (2 of 2)]
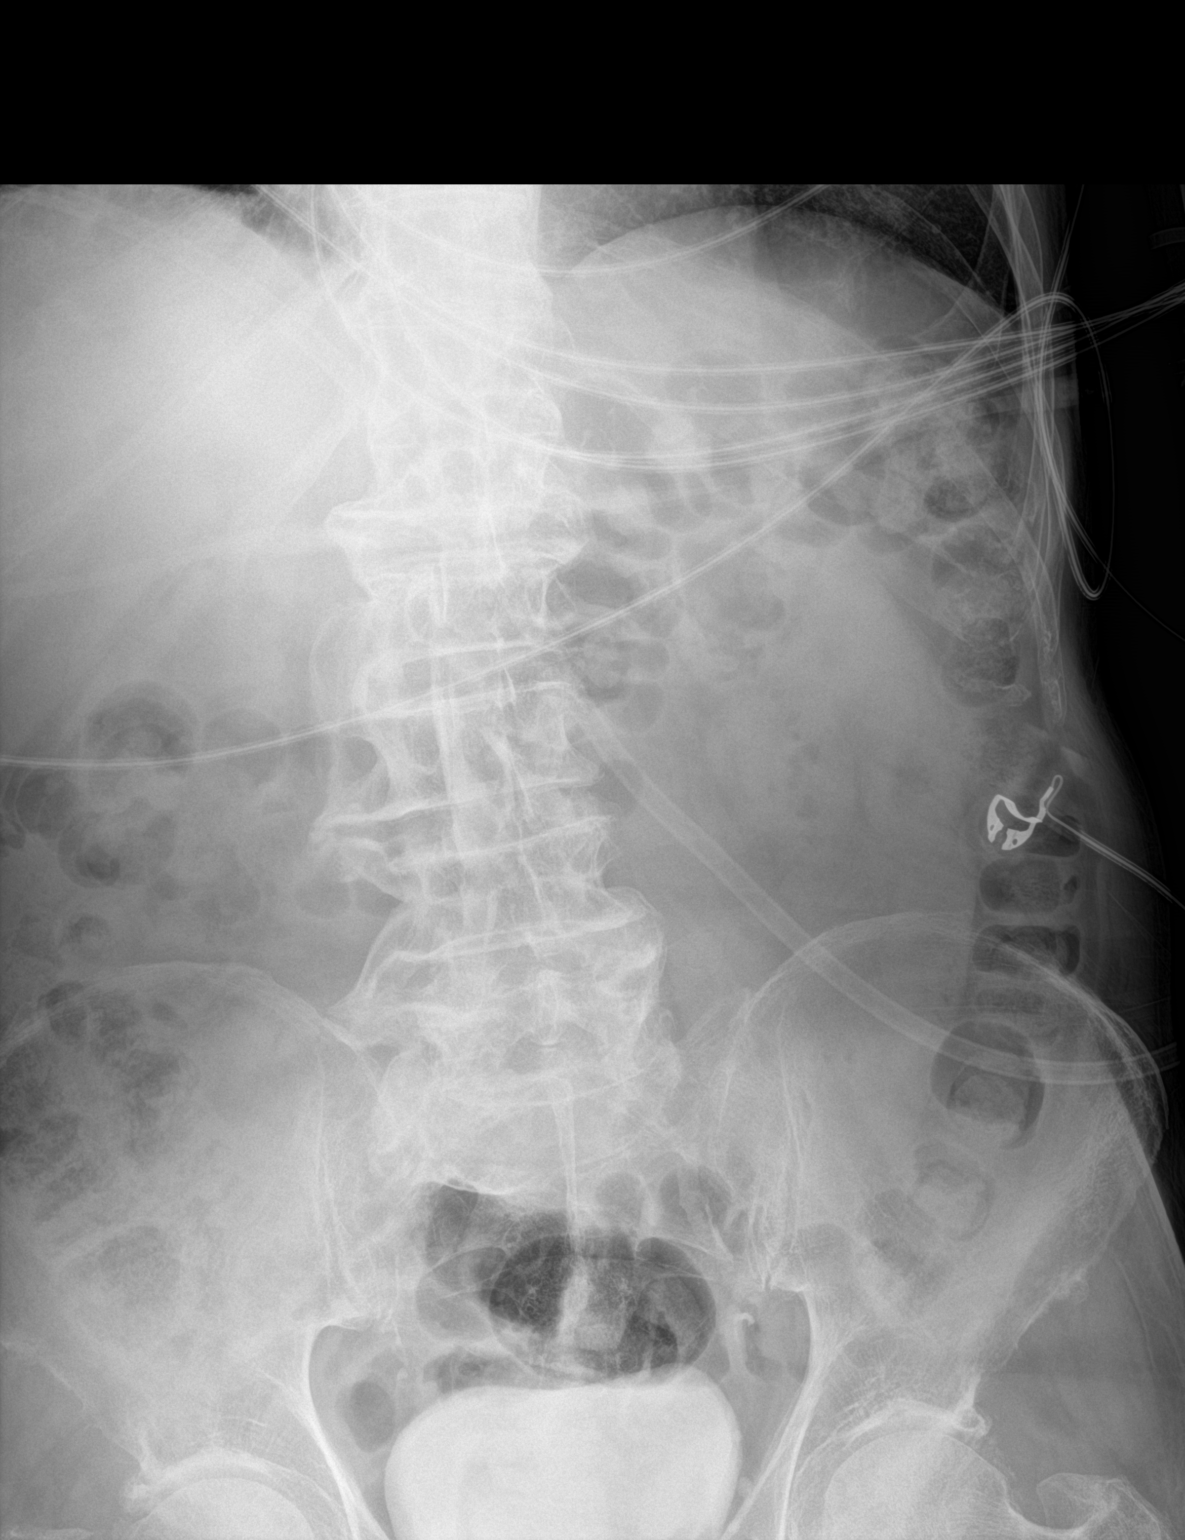

[2 of 2 positions shown; findings below may reference images not displayed]

FINDINGS: The bowel gas pattern is normal. No radio-opaque calculi or other
significant radiographic abnormality are seen.
IMPRESSION: Negative.

## 2019-06-29 IMAGING — DX DG CHEST 1V PORT
2 series · 2 of 2 positions shown · non-contrast
Comparison: [DATE]

CLINICAL DATA: Altered mental status

EXAM:
PORTABLE CHEST 1 VIEW

[chest ap (1 of 2)]
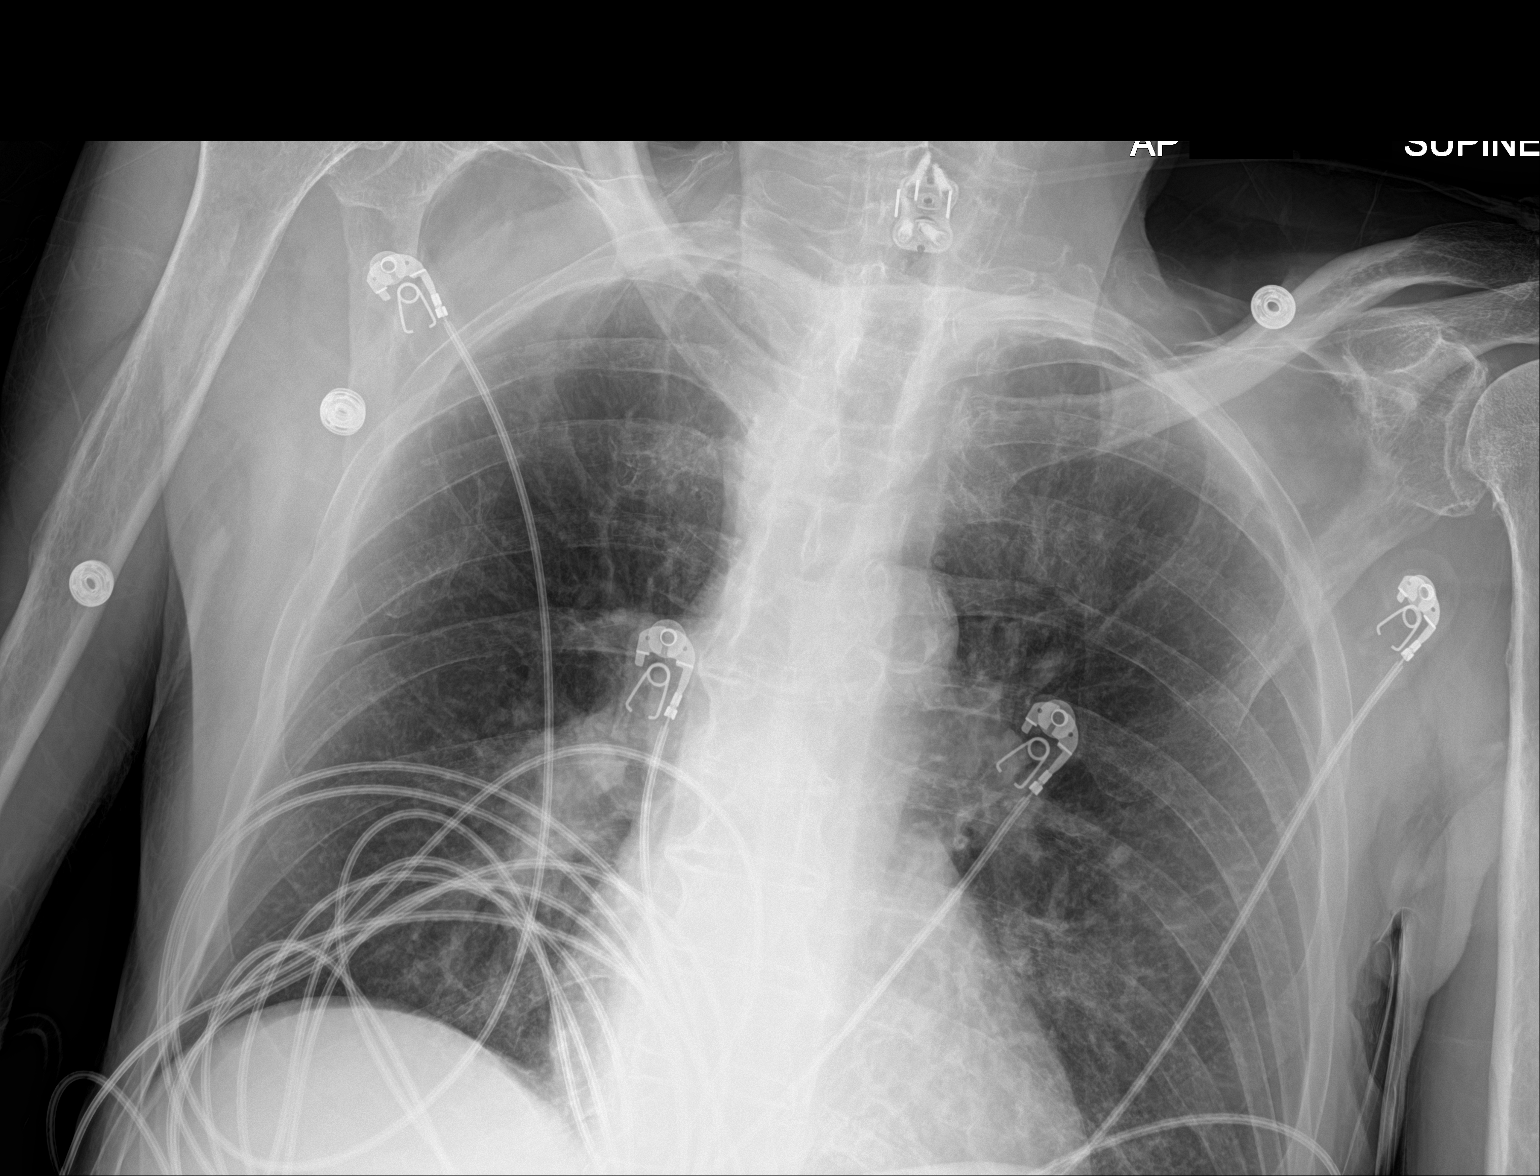

[chest ap (2 of 2)]
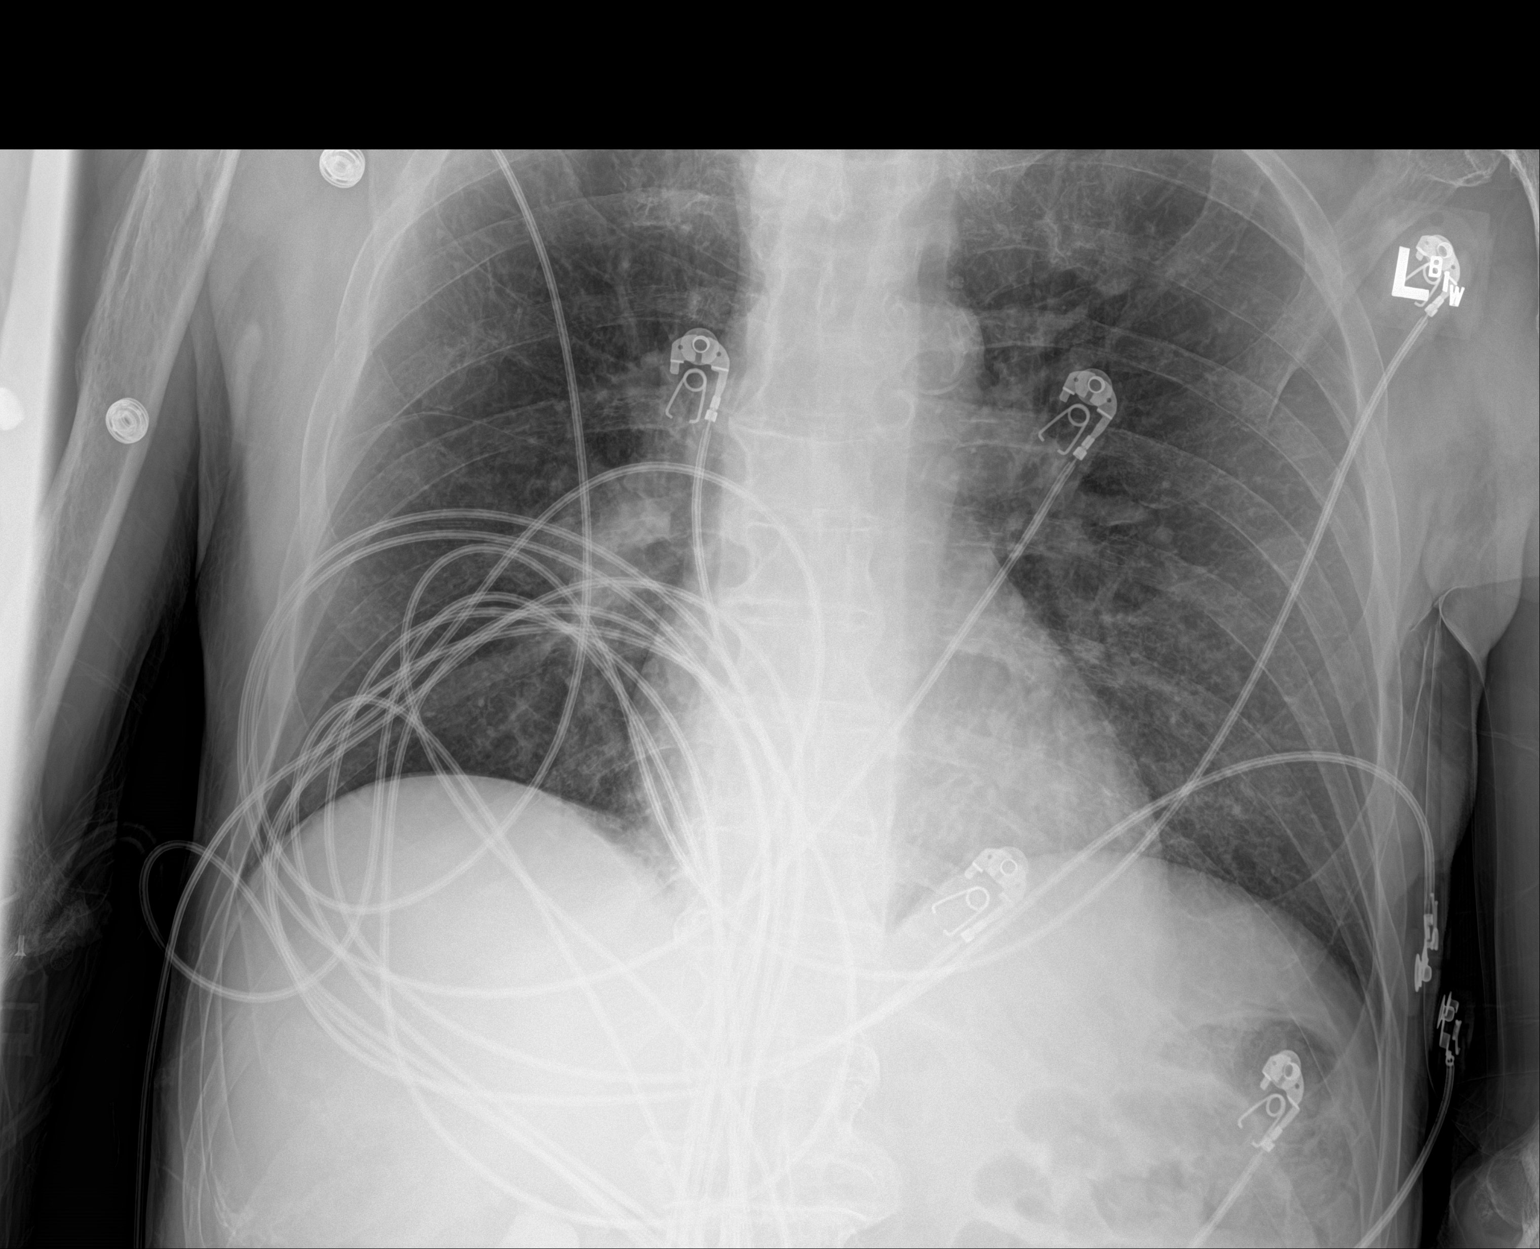

[2 of 2 positions shown; findings below may reference images not displayed]

FINDINGS: The heart size and mediastinal contours are within normal limits.
Calcific aortic knob. No focal airspace consolidation, pleural
effusion, or pneumothorax. Osseous structures appear diffusely
demineralized. Question minimally displaced fracture of the lateral
aspect of the right fourth rib and possibly the adjacent right fifth
rib.
IMPRESSION: 1. No active disease within the chest.
2. Question minimally displaced fracture of the lateral aspect of
the right fourth rib and possibly the adjacent right fifth rib.
Correlation with point tenderness at these sites is recommended.

## 2019-06-29 IMAGING — MR MR HEAD WO/W CM
14 of 16 series · 40 of 48 positions shown · IV contrast (gadavist)
Comparison: Prior CTs from [DATE].

CLINICAL DATA: Initial evaluation for acute encephalopathy.
Possible seizure.

EXAM:
MRI HEAD WITHOUT AND WITH CONTRAST
TECHNIQUE: Multiplanar, multiecho pulse sequences of the brain and surrounding
structures were obtained without and with intravenous contrast.
CONTRAST:  6.2mL GADAVIST GADOBUTROL 1 MMOL/ML IV SOLN

[Series 5: DWI · axial · 3.0mm · 0.88mm/px · z∈[-73,+68]mm · 7 of 100 slices shown (1 of 4)]
[im 1/100]
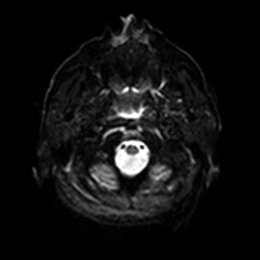
[im 17/100]
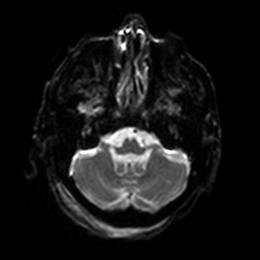
[im 34/100]
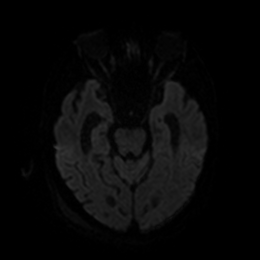
[im 50/100]
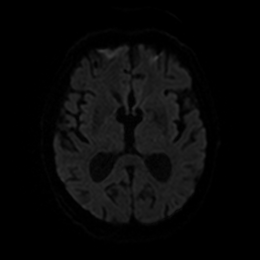
[im 67/100]
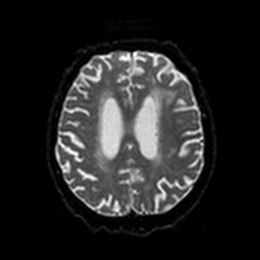
[im 83/100]
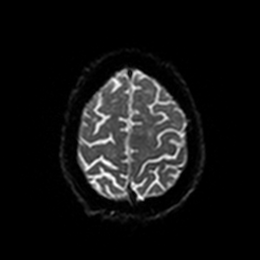
[im 100/100]
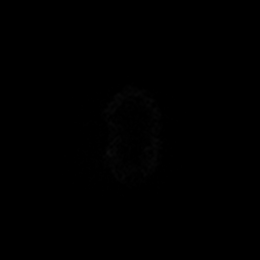

[Series 6: DWI · axial · 3.0mm · 0.88mm/px · z∈[-73,+68]mm · 3 of 50 slices shown (2 of 4)]
[im 1/50]
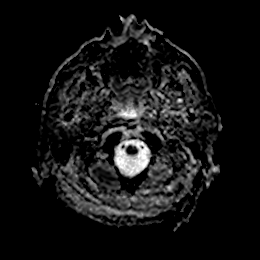
[im 25/50]
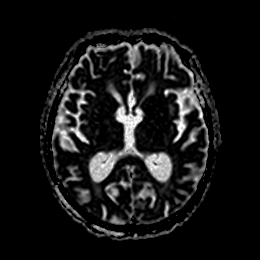
[im 50/50]
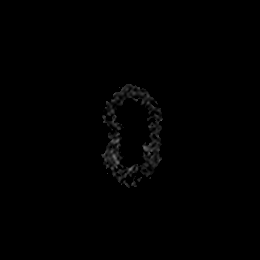

[Series 7: T1 · sagittal · 5.0mm · 0.75mm/px · 2 of 25 slices shown]
[im 1/25]
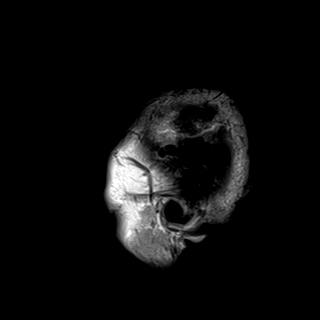
[im 25/25]
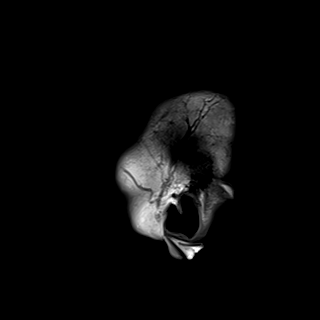

[Series 8: DWI · coronal · 4.0mm · 0.88mm/px · 5 of 70 slices shown (3 of 4)]
[im 1/70]
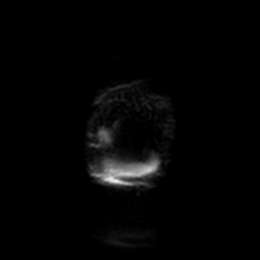
[im 18/70]
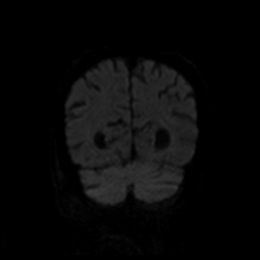
[im 35/70]
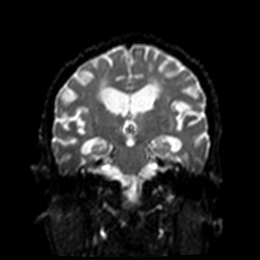
[im 52/70]
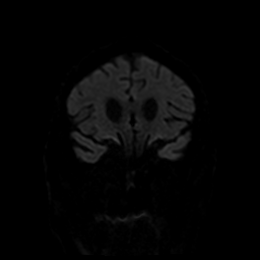
[im 70/70]
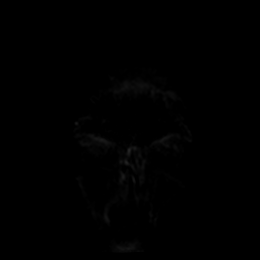

[Series 9: DWI · coronal · 4.0mm · 0.88mm/px · 2 of 35 slices shown (4 of 4)]
[im 1/35]
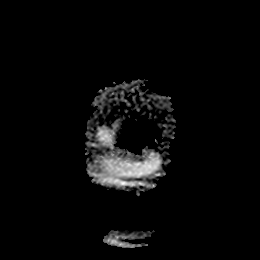
[im 35/35]
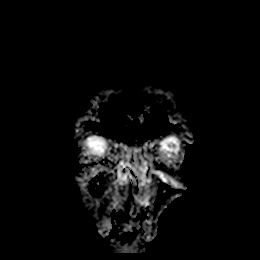

[Series 10: T2 · axial · 5.0mm · 0.72mm/px · z∈[-70,+75]mm · 2 of 25 slices shown (1 of 2)]
[im 1/25]
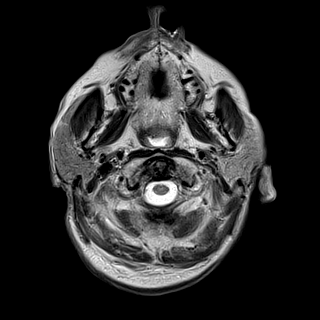
[im 25/25]
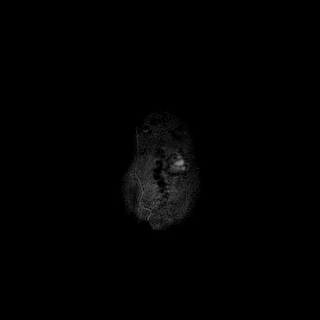

[Series 11: FLAIR · axial · 5.0mm · 0.45mm/px · z∈[-69,+76]mm · 2 of 27 slices shown (1 of 2)]
[im 1/27]
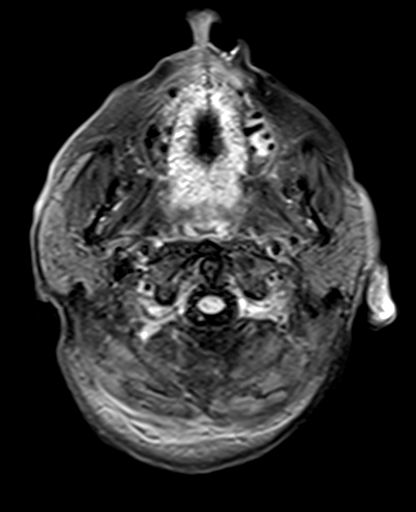
[im 27/27]
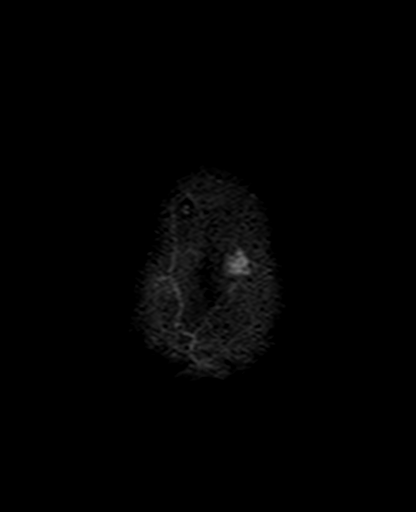

[Series 13: pha_images · axial · 3.0mm · 0.90mm/px · z∈[-76,+86]mm · 4 of 57 slices shown]
[im 1/57]
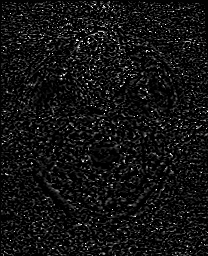
[im 19/57]
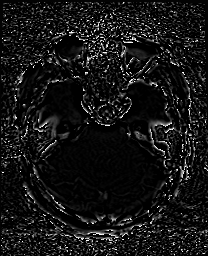
[im 38/57]
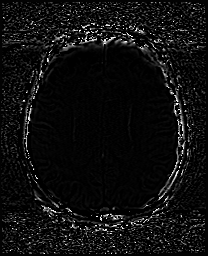
[im 57/57]
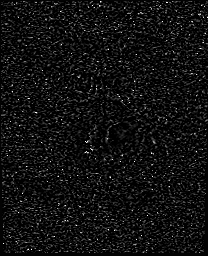

[Series 14: swi_images · axial · 3.0mm · 0.90mm/px · z∈[-76,+89]mm · 4 of 60 slices shown]
[im 1/60]
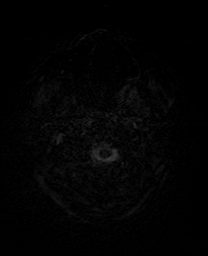
[im 20/60]
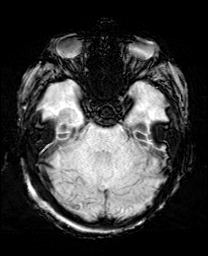
[im 40/60]
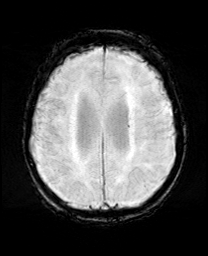
[im 60/60]
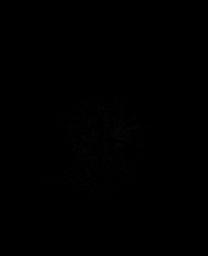

[Series 17: FLAIR · coronal · 3.0mm · 0.56mm/px · 1 of 21 slices shown (2 of 2)]
[im 1/21]
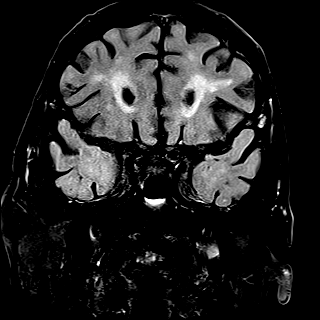

[Series 18: T2 · coronal · 3.0mm · 0.27mm/px · 2 of 32 slices shown (2 of 2)]
[im 1/32]
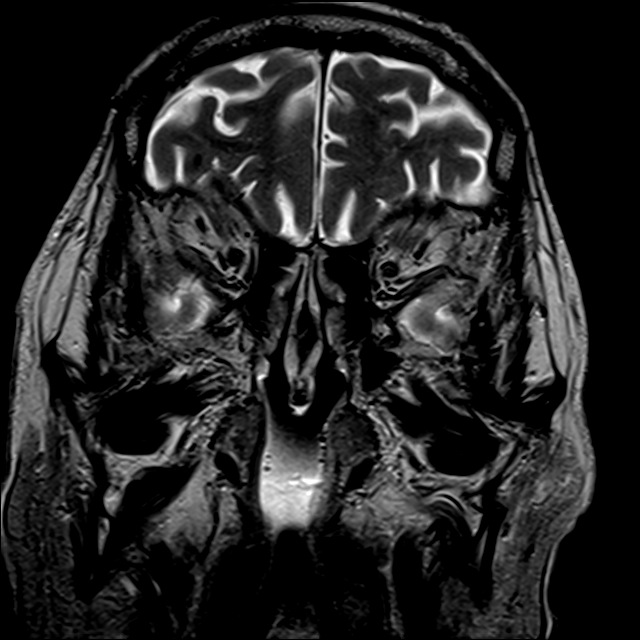
[im 32/32]
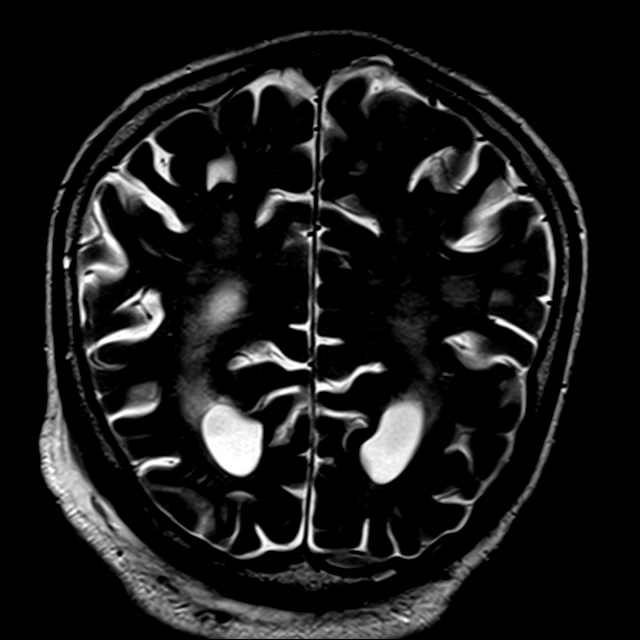

[Series 20: T1 post-contrast · coronal · 5.0mm · 0.34mm/px · 2 of 28 slices shown (1 of 2)]
[im 1/28]
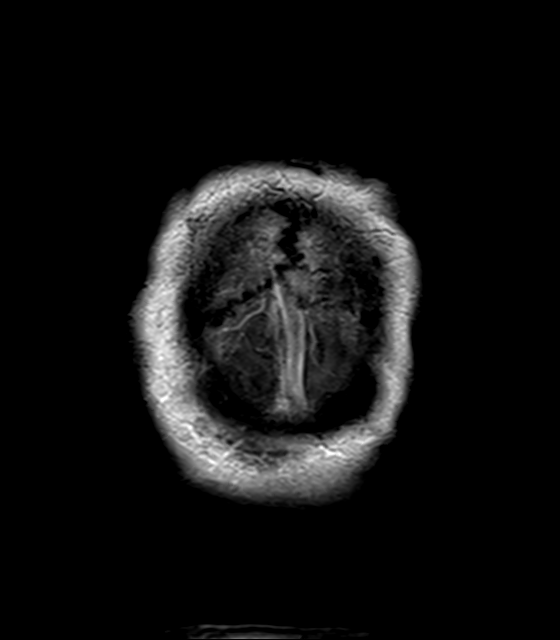
[im 28/28]
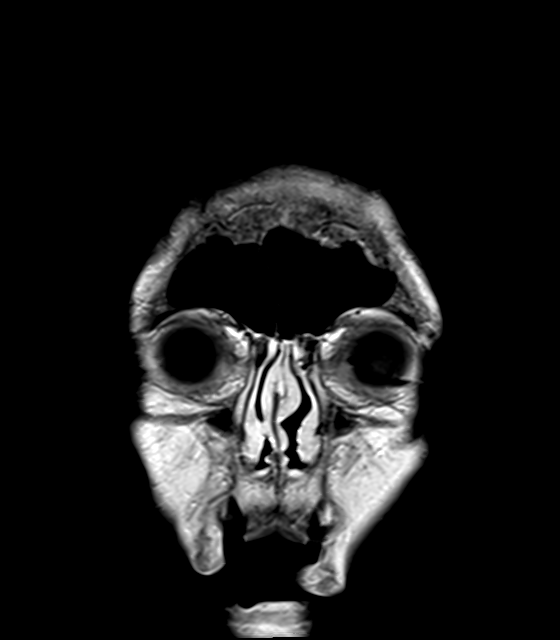

[Series 21: T1 post-contrast · sagittal · 5.0mm · 0.72mm/px · 2 of 25 slices shown (2 of 2)]
[im 1/25]
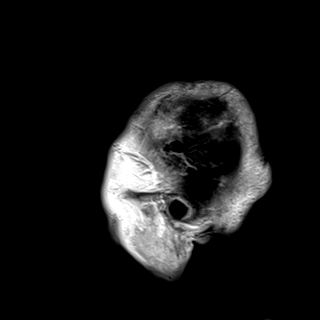
[im 25/25]
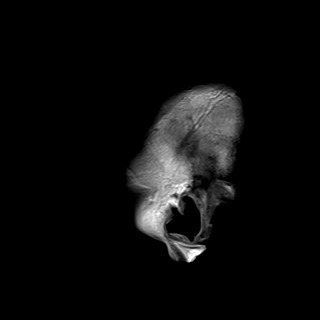

[Series 22: T2 post-contrast · coronal · 5.0mm · 0.72mm/px · 2 of 28 slices shown]
[im 1/28]
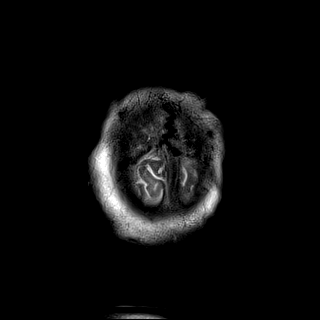
[im 28/28]
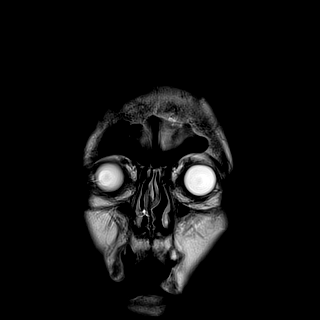

[40 of 48 positions shown; findings below may reference images not displayed]

FINDINGS: Brain: Examination mildly degraded by motion artifact.

Diffuse prominence of the CSF containing spaces compatible
generalized age-related cerebral atrophy. Patchy T2/FLAIR
hyperintensity within the periventricular and deep white matter both
cerebral hemispheres most consistent with chronic small vessel
ischemic disease, moderate in nature. Subcentimeter focus of FLAIR
hyperintensity involving the subcortical parasagittal left occipital
lobe noted (series 11, image 15), nonspecific, but favored to be
small vessel related. No other imaging findings to suggest PRES.

No abnormal foci of restricted diffusion to suggest acute or
subacute ischemia or changes related to seizure. Gray-white matter
differentiation maintained. No encephalomalacia to suggest chronic
cortical infarction. No acute intracranial hemorrhage. Single
punctate chronic microhemorrhage noted involving the left periatrial
white matter, likely small vessel related. No other evidence for
chronic intracranial hemorrhage.

No mass lesion, midline shift or mass effect. Diffuse ventricular
prominence related to global parenchymal volume loss without
hydrocephalus. No extra-axial fluid collection. Pituitary gland
suprasellar region normal. Midline structures intact.

No abnormal enhancement.

Vascular: Major intracranial vascular flow voids are maintained.

Skull and upper cervical spine: Craniocervical junction within
normal limits. Bone marrow signal intensity normal. Soft tissue
edema present within the right occipital/suboccipital scalp (series
11, image 10), of uncertain significance.

Sinuses/Orbits: Globes and orbital soft tissues within normal
limits. Patient status post ocular lens replacement on the right.
Scattered mucosal thickening noted throughout the paranasal sinuses.
Fluid seen layering within the nasopharynx. Patient is intubated. No
significant mastoid effusion. Inner ear structures grossly normal.

Other: None.
IMPRESSION: 1. No acute intracranial process identified.
2. Moderate cerebral atrophy with chronic microvascular ischemic
disease.
3. Soft tissue edema within the right occipital/suboccipital scalp,
of uncertain etiology. Query recent fall. Correlation with physical
exam recommended.

## 2019-06-29 MED ORDER — LACTATED RINGERS IV SOLN: INTRAVENOUS | Status: AC

## 2019-06-29 MED ORDER — ONDANSETRON HCL 4 MG/2ML IJ SOLN: 4.0000 mg | Freq: Four times a day (QID) | INTRAMUSCULAR | Status: DC | PRN

## 2019-06-29 MED ORDER — HEPARIN SODIUM (PORCINE) 5000 UNIT/ML IJ SOLN
5000.0000 [IU] | Freq: Three times a day (TID) | INTRAMUSCULAR | Status: DC
  Administered 2019-06-29 – 2019-07-03 (×12): 5000 [IU] via SUBCUTANEOUS
  Filled 2019-06-29 (×12): qty 1

## 2019-06-29 MED ORDER — PANTOPRAZOLE SODIUM 40 MG IV SOLR
40.0000 mg | Freq: Every day | INTRAVENOUS | Status: DC
  Administered 2019-06-29 – 2019-07-02 (×4): 40 mg via INTRAVENOUS
  Filled 2019-06-29 (×4): qty 40

## 2019-06-29 MED ORDER — MAGNESIUM SULFATE 2 GM/50ML IV SOLN
2.0000 g | Freq: Once | INTRAVENOUS | Status: AC
  Administered 2019-06-29: 2 g via INTRAVENOUS
  Filled 2019-06-29: qty 50

## 2019-06-29 MED ORDER — POTASSIUM CHLORIDE 10 MEQ/100ML IV SOLN
10.0000 meq | INTRAVENOUS | Status: AC
  Administered 2019-06-30 (×3): 10 meq via INTRAVENOUS
  Filled 2019-06-29 (×3): qty 100

## 2019-06-29 MED ORDER — FENTANYL CITRATE (PF) 100 MCG/2ML IJ SOLN: 25.0000 ug | INTRAMUSCULAR | Status: DC | PRN

## 2019-06-29 MED ORDER — FENTANYL CITRATE (PF) 100 MCG/2ML IJ SOLN
INTRAMUSCULAR | Status: AC
  Administered 2019-06-29: 50 ug via INTRAVENOUS
  Filled 2019-06-29: qty 2

## 2019-06-29 MED ORDER — FENTANYL CITRATE (PF) 100 MCG/2ML IJ SOLN
25.0000 ug | INTRAMUSCULAR | Status: DC | PRN
  Administered 2019-06-30 (×3): 50 ug via INTRAVENOUS
  Administered 2019-07-01: 100 ug via INTRAVENOUS
  Filled 2019-06-29 (×5): qty 2

## 2019-06-29 MED ORDER — PHENYLEPHRINE HCL-NACL 10-0.9 MG/250ML-% IV SOLN
0.0000 ug/min | INTRAVENOUS | Status: DC
  Administered 2019-06-29 – 2019-06-30 (×2): 4 ug/min via INTRAVENOUS

## 2019-06-29 MED ORDER — PHENYLEPHRINE HCL-NACL 10-0.9 MG/250ML-% IV SOLN
INTRAVENOUS | Status: AC
  Filled 2019-06-29: qty 250

## 2019-06-29 MED ORDER — SODIUM CHLORIDE 0.9% FLUSH
3.0000 mL | Freq: Once | INTRAVENOUS | Status: AC
  Administered 2019-06-29: 3 mL via INTRAVENOUS

## 2019-06-29 MED ORDER — DEXMEDETOMIDINE HCL IN NACL 400 MCG/100ML IV SOLN: 0.0000 ug/kg/h | INTRAVENOUS | Status: DC

## 2019-06-29 MED ORDER — FENTANYL CITRATE (PF) 100 MCG/2ML IJ SOLN: 50.0000 ug | Freq: Once | INTRAMUSCULAR | Status: AC

## 2019-06-29 MED ORDER — ROCURONIUM BROMIDE 50 MG/5ML IV SOLN
70.0000 mg | Freq: Once | INTRAVENOUS | Status: AC
  Administered 2019-06-29: 70 mg via INTRAVENOUS

## 2019-06-29 MED ORDER — ACETAMINOPHEN 325 MG PO TABS: 650.0000 mg | ORAL_TABLET | ORAL | Status: DC | PRN

## 2019-06-29 MED ORDER — MIDAZOLAM HCL 2 MG/2ML IJ SOLN
INTRAMUSCULAR | Status: AC
  Administered 2019-06-29: 2 mg via INTRAVENOUS
  Filled 2019-06-29: qty 2

## 2019-06-29 MED ORDER — CHLORHEXIDINE GLUCONATE CLOTH 2 % EX PADS
6.0000 | MEDICATED_PAD | Freq: Every day | CUTANEOUS | Status: DC
  Administered 2019-07-01: 6 via TOPICAL

## 2019-06-29 MED ORDER — MIDAZOLAM HCL 2 MG/2ML IJ SOLN: 2.0000 mg | Freq: Once | INTRAMUSCULAR | Status: AC

## 2019-06-29 MED ORDER — ETOMIDATE 2 MG/ML IV SOLN
20.0000 mg | Freq: Once | INTRAVENOUS | Status: AC
  Administered 2019-06-29: 20 mg via INTRAVENOUS

## 2019-06-29 MED ORDER — IOHEXOL 350 MG/ML SOLN
100.0000 mL | Freq: Once | INTRAVENOUS | Status: AC | PRN
  Administered 2019-06-29: 100 mL via INTRAVENOUS

## 2019-06-29 NOTE — Consult Note (Addendum)
ER Consult Note   Seth Boyd KMQ:[id number] DOB: [birth year] DOA: 29-Jul-2019  PCP: [facility] Consultants:  None Patient coming from:  Home - lives alone; NOK:  Seth Boyd, [id number]  Chief Complaint: Code Stroke  HPI: Seth Boyd is a 85 y.o. male with medical history significant of DM and HTN presenting with Code Stroke.  His son reports that he was not able to get in touch with him late yesterday afternoon.  He left his garage open and didn't set his alarm.  He went to check on him this morning and thought he heard him snoring.  He was lying there with his eyes open, chin on check, lying in floor.  Maybe he had been trying to undress yesterday when he fell.  He was unable to talk, could follow some commands.  He was making that snoring sound throughout, but may have had airway damage from neck surgery, seems to have some dysphagia.  Full code.  His son also notes that his brother was at [facility] years ago with a similar problem and he is doing well now.  He couldn't talk and was making the same kind of noise.  They thought maybe it was seizure.    ED Course:  OK last night, didn't answer door this AM.  Found down.  Gaze preference to left, ongoing.  Code stroke.  CT/CTA negative.  Has a rib fracture.  Labs normal.  EEG with encephalopathy.  Needs MRI.  Review of Systems:  Unable to perform   Ambulatory Status:  Ambulates without assistance  Past Medical History:  Diagnosis Date  . DM (diabetes mellitus) (HCC)   . Essential hypertension     Past Surgical History:  Procedure Laterality Date  . c-spine      Social History   Socioeconomic History  . Marital status: Single    Spouse name: Not on file  . Number of children: Not on file  . Years of education: Not on file  . Highest education level: Not on file  Occupational History  . Not on file  Tobacco Use  . Smoking status: Not on file  Substance and Sexual Activity  . Alcohol use: Not on file  . Drug  use: Not on file  . Sexual activity: Not on file  Other Topics Concern  . Not on file  Social History Narrative  . Not on file   Social Determinants of Health   Financial Resource Strain:   . Difficulty of Paying Living Expenses: Not on file  Food Insecurity:   . Worried About Programme researcher, broadcasting/film/video in the Last Year: Not on file  . Ran Out of Food in the Last Year: Not on file  Transportation Needs:   . Lack of Transportation (Medical): Not on file  . Lack of Transportation (Non-Medical): Not on file  Physical Activity:   . Days of Exercise per Week: Not on file  . Minutes of Exercise per Session: Not on file  Stress:   . Feeling of Stress : Not on file  Social Connections:   . Frequency of Communication with Friends and Family: Not on file  . Frequency of Social Gatherings with Friends and Family: Not on file  . Attends Religious Services: Not on file  . Active Member of Clubs or Organizations: Not on file  . Attends Banker Meetings: Not on file  . Marital Status: Not on file  Intimate Partner Violence:   . Fear of Current or Ex-Partner:  Not on file  . Emotionally Abused: Not on file  . Physically Abused: Not on file  . Sexually Abused: Not on file    No Known Allergies  History reviewed. No pertinent family history.  Prior to Admission medications   Not on File    Physical Exam: Vitals:   08-Jul-2019 1213 2019-07-08 1223  BP: 123/72   Pulse: 88   Resp: (!) 22   Temp:  (!) 96.8 F (36 C)  TempSrc:  Rectal  SpO2: 92%      . General:  As I entered the room, I could hear the patient audibly breathing, gasping, and gurgling secretions . Eyes:  Fixed gaze to the left, conjunctival injection . ENT:  grossly normal lips & tongue, moderately dry mm (but mouth breathing); not protecting his airway - improved breathing with continuous jaw thrust but resumes when his jaw is released . Neck:  no LAD, masses or thyromegaly . Cardiovascular:  RRR, no m/r/g. No LE  edema.  Marland Kitchen Respiratory:   Coarse breath sounds, poor airway protection.   . Abdomen:  soft, NT, ND . Skin:  no rash or induration seen on limited exam . Musculoskeletal:  grossly normal tone BUE/BLE, no bony abnormality, no obvious weakness but limited by respiratory distress . Psychiatric:  Alert, minimal attempts at speech but unintelligible . Neurologic:  Unable to perform    Radiological Exams on Admission: CT Code Stroke CTA Head W/WO contrast  Result Date: 07-08-19 CLINICAL DATA:  Right leg weakness.  Aphasia. EXAM: CT ANGIOGRAPHY HEAD AND NECK CT PERFUSION BRAIN TECHNIQUE: Multidetector CT imaging of the head and neck was performed using the standard protocol during bolus administration of intravenous contrast. Multiplanar CT image reconstructions and MIPs were obtained to evaluate the vascular anatomy. Carotid stenosis measurements (when applicable) are obtained utilizing NASCET criteria, using the distal internal carotid diameter as the denominator. Multiphase CT imaging of the brain was performed following IV bolus contrast injection. Subsequent parametric perfusion maps were calculated using RAPID software. CONTRAST:  OMNIPAQUE IOHEXOL 350 MG/ML SOLN COMPARISON:  Head CT earlier same day FINDINGS: CTA NECK FINDINGS Aortic arch: Aortic atherosclerosis. 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 Right carotid system: Common carotid artery widely patent to the bifurcation. Soft and calcified plaque at the carotid bifurcation and ICA bulb. Minimal diameter of the ICA bulb measures 3.5 mm. Compared to a more distal cervical ICA diameter of 5 mm, this indicates a 30% stenosis. Left carotid system: Common carotid artery widely patent to the bifurcation. Calcified plaque at the carotid bifurcation and ICA bulb. Minimal diameter 4 mm. Compared to a more distal cervical ICA diameter of 5 mm, this indicates a 20% stenosis. Vertebral arteries: Both vertebral  artery origins widely patent. Left vertebral artery arises from the arch as noted above. Both vessels widely patent through the cervical region to the foramen magnum. Skeleton: Previous ACDF C6-7. No acute bone finding. Prominent anterior bridging osteophytes. Other neck: No mass or lymphadenopathy. Upper chest: Pleural and parenchymal scarring on the right. Review of the MIP images confirms the above findings CTA HEAD FINDINGS Anterior circulation: Both internal carotid arteries are widely patent through the skull base. There is atherosclerotic calcification in both carotid siphon regions with stenosis estimated at 30-50%. Supraclinoid internal carotid arteries widely patent. The anterior and middle cerebral vessels are patent. No large or medium vessel occlusion. No correctable proximal stenosis. Posterior circulation: Both vertebral arteries patent through the foramen magnum to the  basilar. Both posteroinferior cerebellar arteries show flow. No basilar stenosis. Superior cerebellar and posterior cerebral arteries are patent. PCAs receive most of there supply from the anterior circulation. Venous sinuses: Patent and normal. Anatomic variants: None Review of the MIP images confirms the above findings CT Brain Perfusion Findings: ASPECTS: 10 CBF (<30%) Volume: 0mL Perfusion (Tmax>6.0s) volume: 0mL Mismatch Volume: 0mL Infarction Location:None IMPRESSION: Negative perfusion examination. No flow limiting carotid stenosis. 30% ICA stenosis on the right. 20% on the left. No intracranial large or medium vessel occlusion. 30-50% narrowing in both carotid siphon regions but no correctable proximal stenosis. These results were communicated to Dr. Wilford CornerArora at 12:02 pmon 2021/10/08by text page via the [facility] messaging system. Electronically Signed   By: Paulina FusiMark  Shogry M.D.   On: 02021/10/08 12:02   CT Code Stroke CTA Neck W/WO contrast  Result Date: 2020/03/05 CLINICAL DATA:  Right leg weakness.  Aphasia. EXAM: CT ANGIOGRAPHY  HEAD AND NECK CT PERFUSION BRAIN TECHNIQUE: Multidetector CT imaging of the head and neck was performed using the standard protocol during bolus administration of intravenous contrast. Multiplanar CT image reconstructions and MIPs were obtained to evaluate the vascular anatomy. Carotid stenosis measurements (when applicable) are obtained utilizing NASCET criteria, using the distal internal carotid diameter as the denominator. Multiphase CT imaging of the brain was performed following IV bolus contrast injection. Subsequent parametric perfusion maps were calculated using RAPID software. CONTRAST:  100mL OMNIPAQUE IOHEXOL 350 MG/ML SOLN COMPARISON:  Head CT earlier same day FINDINGS: CTA NECK FINDINGS Aortic arch: Aortic atherosclerosis. 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 Right carotid system: Common carotid artery widely patent to the bifurcation. Soft and calcified plaque at the carotid bifurcation and ICA bulb. Minimal diameter of the ICA bulb measures 3.5 mm. Compared to a more distal cervical ICA diameter of 5 mm, this indicates a 30% stenosis. Left carotid system: Common carotid artery widely patent to the bifurcation. Calcified plaque at the carotid bifurcation and ICA bulb. Minimal diameter 4 mm. Compared to a more distal cervical ICA diameter of 5 mm, this indicates a 20% stenosis. Vertebral arteries: Both vertebral artery origins widely patent. Left vertebral artery arises from the arch as noted above. Both vessels widely patent through the cervical region to the foramen magnum. Skeleton: Previous ACDF C6-7. No acute bone finding. Prominent anterior bridging osteophytes. Other neck: No mass or lymphadenopathy. Upper chest: Pleural and parenchymal scarring on the right. Review of the MIP images confirms the above findings CTA HEAD FINDINGS Anterior circulation: Both internal carotid arteries are widely patent through the skull base. There is atherosclerotic  calcification in both carotid siphon regions with stenosis estimated at 30-50%. Supraclinoid internal carotid arteries widely patent. The anterior and middle cerebral vessels are patent. No large or medium vessel occlusion. No correctable proximal stenosis. Posterior circulation: Both vertebral arteries patent through the foramen magnum to the basilar. Both posteroinferior cerebellar arteries show flow. No basilar stenosis. Superior cerebellar and posterior cerebral arteries are patent. PCAs receive most of there supply from the anterior circulation. Venous sinuses: Patent and normal. Anatomic variants: None Review of the MIP images confirms the above findings CT Brain Perfusion Findings: ASPECTS: 10 CBF (<30%) Volume: 0mL Perfusion (Tmax>6.0s) volume: 0mL Mismatch Volume: 0mL Infarction Location:None IMPRESSION: Negative perfusion examination. No flow limiting carotid stenosis. 30% ICA stenosis on the right. 20% on the left. No intracranial large or medium vessel occlusion. 30-50% narrowing in both carotid siphon regions but no correctable proximal stenosis. These results were  communicated to Dr. Wilford Corner at 12:02 pmon 01/23/2021by text page via the [facility] messaging system. Electronically Signed   By: Paulina Fusi M.D.   On: 06/08/2019 12:02   CT C-SPINE NO CHARGE  Result Date: 06/15/2019 CLINICAL DATA:  Fall.  Neurological deficit. EXAM: CT CERVICAL SPINE WITHOUT CONTRAST TECHNIQUE: Multidetector CT imaging of the cervical spine was performed without intravenous contrast. Multiplanar CT image reconstructions were also generated. COMPARISON:  01/26/2018 FINDINGS: Alignment: No malalignment. Skull base and vertebrae: No fracture. Soft tissues and spinal canal: Negative Disc levels: Solid bridging anterior osteophytes from C2 through the upper thoracic region. Previous ACDF C6-7 appears solid. No canal stenosis. No significant foraminal stenosis. Upper chest: Negative for acute disease. Pleural and parenchymal  scarring at the right apex. Other: Pronounced sternoclavicular joint degenerative changes right more than left. IMPRESSION: No traumatic cervical finding. 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 Electronically Signed   By: Paulina Fusi M.D.   On: 05/31/2019 12:06   CT Code Stroke Cerebral Perfusion with contrast  Result Date: 06/28/2019 CLINICAL DATA:  Right leg weakness.  Aphasia. EXAM: CT ANGIOGRAPHY HEAD AND NECK CT PERFUSION BRAIN TECHNIQUE: Multidetector CT imaging of the head and neck was performed using the standard protocol during bolus administration of intravenous contrast. Multiplanar CT image reconstructions and MIPs were obtained to evaluate the vascular anatomy. Carotid stenosis measurements (when applicable) are obtained utilizing NASCET criteria, using the distal internal carotid diameter as the denominator. Multiphase CT imaging of the brain was performed following IV bolus contrast injection. Subsequent parametric perfusion maps were calculated using RAPID software. CONTRAST:  OMNIPAQUE IOHEXOL 350 MG/ML SOLN COMPARISON:  Head CT earlier same day FINDINGS: CTA NECK FINDINGS Aortic arch: Aortic atherosclerosis. 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 Right carotid system: Common carotid artery widely patent to the bifurcation. Soft and calcified plaque at the carotid bifurcation and ICA bulb. Minimal diameter of the ICA bulb measures 3.5 mm. Compared to a more distal cervical ICA diameter of 5 mm, this indicates a 30% stenosis. Left carotid system: Common carotid artery widely patent to the bifurcation. Calcified plaque at the carotid bifurcation and ICA bulb. Minimal diameter 4 mm. Compared to a more distal cervical ICA diameter of 5 mm, this indicates a 20% stenosis. Vertebral arteries: Both vertebral artery origins widely patent. Left vertebral artery arises from the arch as noted above. Both vessels widely patent through  the cervical region to the foramen magnum. Skeleton: Previous ACDF C6-7. No acute bone finding. Prominent anterior bridging osteophytes. Other neck: No mass or lymphadenopathy. Upper chest: Pleural and parenchymal scarring on the right. Review of the MIP images confirms the above findings CTA HEAD FINDINGS Anterior circulation: Both internal carotid arteries are widely patent through the skull base. There is atherosclerotic calcification in both carotid siphon regions with stenosis estimated at 30-50%. Supraclinoid internal carotid arteries widely patent. The anterior and middle cerebral vessels are patent. No large or medium vessel occlusion. No correctable proximal stenosis. Posterior circulation: Both vertebral arteries patent through the foramen magnum to the basilar. Both posteroinferior cerebellar arteries show flow. No basilar stenosis. Superior cerebellar and posterior cerebral arteries are patent. PCAs receive most of there supply from the anterior circulation. Venous sinuses: Patent and normal. Anatomic variants: None Review of the MIP images confirms the above findings CT Brain Perfusion Findings: ASPECTS: 10 CBF (<30%) Volume: 29mL Perfusion (Tmax>6.0s) volume: 14mL Mismatch Volume: 37mL Infarction Location:None IMPRESSION: Negative perfusion examination. No flow  limiting carotid stenosis. 30% ICA stenosis on the right. 20% on the left. No intracranial large or medium vessel occlusion. 30-50% narrowing in both carotid siphon regions but no correctable proximal stenosis. These results were communicated to Dr. Rory Percy at 12:02 pmon February 20, 2021by text page via the [facility] messaging system. Electronically Signed   By: Nelson Chimes M.D.   On: Jul 19, 2019 12:02   DG Chest Port 1 View  Result Date: 2019/07/19 CLINICAL DATA:  Altered mental status EXAM: PORTABLE CHEST 1 VIEW COMPARISON:  02/12/2019 FINDINGS: The heart size and mediastinal contours are within normal limits. Calcific aortic knob. No focal airspace  consolidation, pleural effusion, or pneumothorax. Osseous structures appear diffusely demineralized. Question minimally displaced fracture of the lateral aspect of the right fourth rib and possibly the adjacent right fifth rib. IMPRESSION: 1. No active disease within the chest. 2. Question minimally displaced fracture of the lateral aspect of the right fourth rib and possibly the adjacent right fifth rib. Correlation with point tenderness at these sites is recommended. Electronically Signed   By: Davina Poke D.O.   On: Jul 19, 2019 12:36   EEG adult  Result Date: 19-Jul-2019 Lora Havens, MD     July 19, 2019  1:53 PM Patient Name: Seth Boyd MRN: [id number] Epilepsy Attending: Lora Havens Referring Physician/Provider: Laurey Morale, NP Date: 07-19-19 Duration: 2 Patient history: 85 y.o. male   With [place] DM who presented to ED with c/o aphasia and right side weakness. CTH was negative for hemorrhage. EEG to evaluate for seizure Level of alertness: lethargic AEDs during EEG study: None Technical aspects: This EEG study was done with scalp electrodes positioned according to the 10-20 International system of electrode placement. Electrical activity was acquired at a sampling rate of 500Hz  and reviewed with a high frequency filter of 70Hz  and a low frequency filter of 1Hz . EEG data were recorded continuously and digitally stored. DESCRIPTION: No clear posterior dominant rhythm was seen. EEG showed continuous generalized 3-6Hz  theta-delta slowing. Hyperventilation and photic stimulation were not performed due to ams. ABNORMALITY - Continuous slow, generalized  IMPRESSION: This study is suggestive of moderate diffuse encephalopathy,,non specific to etiology. No seizures or epileptiform discharges were seen throughout the recording. Lora Havens   CT HEAD CODE STROKE WO CONTRAST  Result Date: 07-19-19 CLINICAL DATA:  Code stroke.  Right leg weakness.  Aphasia. EXAM: CT HEAD WITHOUT CONTRAST  TECHNIQUE: Contiguous axial images were obtained from the base of the skull through the vertex without intravenous contrast. COMPARISON:  01/26/2018 FINDINGS: Brain: Generalized atrophy. Chronic small-vessel ischemic changes affecting the cerebral hemispheric white matter. No sign of acute infarction, mass lesion, hemorrhage, hydrocephalus or extra-axial collection. Vascular: There is atherosclerotic calcification of the major vessels at the base of the brain. Skull: Negative Sinuses/Orbits: Clear/normal Other: None ASPECTS ([place] Stroke Program Early CT Score) - Ganglionic level infarction (caudate, lentiform nuclei, internal capsule, insula, M1-M3 cortex): 7 - Supraganglionic infarction (M4-M6 cortex): 3 Total score (0-10 with 10 being normal): 10 IMPRESSION: 1. No acute finding. Atrophy and chronic small-vessel ischemic changes. 2. ASPECTS is 10 3. These results were communicated to Dr. Rory Percy at 11:36 amon 02/20/2021by text page via the [facility] messaging system. Electronically Signed   By: Nelson Chimes M.D.   On: Jul 19, 2019 11:37    EKG: Independently reviewed.  NSR with rate 97; prolonged QTc 530; significant artifact with no evidence of acute ischemia   Labs on Admission: I have personally reviewed the available labs and imaging studies at the time of the  admission.  Pertinent labs:   Glucose 227 AST 75/ALT 43 WBC 12.2 INR 1.2 Respiratory panel PCR negative UA: >500 glucose, large Hgb, 20 ketones, 100 protein UDS negative   Assessment/Plan Principal Problem:   CVA (cerebral vascular accident) (HCC)   -Called to admit patient for possible TIA -Negative CT/CTA -Neurology has consulted -EEG performed and shows diffuse encephalopathy without obvious seizure activity -CVA appears to be the most likely diagnosis, but he needs MRI to confirm -As I walked into the room, it appeared that the patient was not protecting his airway; he may have aspirated while in the ER -Regardless, he appears  to need urgent intubation for airway protection -I have consulted PCCM and NP Earlene PlaterDavis is in the room with the patient now with Dr. Ollen BowlHarkins en route -They will assume care of the patient at this time -I did call and confirm with the patient's son that the patient is full code and verified that he would want intubation for his father at this time -[facility] will be happy to assume care once the patient is no longer critically ill and requiring ICU level of care     Total critical care time: 45 minutes Critical care time was exclusive of separately billable procedures and treating other patients. Critical care was necessary to treat or prevent imminent or life-threatening deterioration. Critical care was time spent personally by me on the following activities: development of treatment plan with patient and/or surrogate as well as nursing, discussions with consultants, evaluation of patient's response to treatment, examination of patient, obtaining history from patient or surrogate, ordering and performing treatments and interventions, ordering and review of laboratory studies, ordering and review of radiographic studies, pulse oximetry and re-evaluation of patient's condition.    Jonah BlueJennifer Sipriano Fendley MD [facility]   How to contact the [facility] Attending or Consulting provider 7A - 7P or covering provider during after hours 7P -7A, for this patient?  1. Check the care team in [facility] and look for a) attending/consulting [facility] provider listed and b) the [facility] team listed 2. Log into [email or web address] and use [facility] universal password to access. If you do not have the password, please contact the hospital operator. 3. Locate the [facility] provider you are looking for under [facility] and page to a number that you can be directly reached. 4. If you still have difficulty reaching the provider, please page the [facility] (Director on Call) for the Hospitalists listed on amion for assistance.   08-Feb-2020, 3:30 PM

## 2019-06-29 NOTE — Progress Notes (Signed)
CRITICAL VALUE ALERT  Critical Value:  K+ 2.7  Date & Time Notied:  07-17-19 2359  Provider Notified: Pola Corn  Orders Received/Actions taken: MD aware. Patient has current orders for K+ replacement.

## 2019-06-29 NOTE — Consult Note (Addendum)
NEURO HOSPITALIST  CONSULT   Requesting Physician: Dr. Rosalia Hammers    Chief Complaint: aphasia/ right side flaccid  History obtained from: Family/ EMS  HPI:                                                                                                                                         Seth Boyd is an 85 y.o. male  With PMH DM who presented to [facility] ED as a code stroke for   right side flaccid and aphasia.   Spoke to son last yesterday at 1400. When he called him at 2000 he did not answer. When the son went over today to check on him this morning, he found him on the floor not speaking.  He found him between the bed and the dresser-unclear if he had a fall. Patient was incontinent of urine per the EMTs.  Patient usually lives alone, able to care for himself and do his bills-although he has not been as sharp as he used to be per h is son. Former tobacco chewer, no ETOH, no drug use.  No previous syncope episodes. Does not use a walker to walk.  Son does report recent problems with sugar control  ED course:  CTH: no hemorrhage CTA: no LVO CTP: no perfusion deficit BG: 220 BP: 152/89  Date last known well: 06/28/19 Time last known well: 1400 tPA Given:no outside of window Modified Rankin: Rankin Score=1 NIHSS:16    Past Medical History:  Diagnosis Date  . DM (diabetes mellitus) (HCC)     Past Surgical History:  Procedure Laterality Date  . c-spine      No family history on file.       Social History:  has no history on file for tobacco, alcohol, and drug.  Allergies: No Known Allergies  Medications:                                                                                                                           Current Facility-Administered Medications  Medication Dose Route Frequency Provider Last Rate Last Admin  . sodium chloride  flush (NS) 0.9 % injection 3 mL  3 mL Intravenous Once Margarita Grizzle, MD       No  current outpatient medications on file.   ROS:                                                                                                                                        unobtainable from patient due to mental status  General Examination:                                                                                                      Blood pressure 123/72, pulse 88, temperature (!) 96.8 F (36 C), temperature source Rectal, resp. rate (!) 22, SpO2 92 %.  Physical Exam  Constitutional: Appears well-developed and well-nourished.  Psych: Affect appropriate to situation Eyes: Normal external eye and conjunctiva. HENT: Normocephalic, no lesions, without obvious abnormality.  His neck is somewhat stiff but he has had C-spine surgery done-no Kernig's or Brudzinski sign elicitable. Musculoskeletal-no joint tenderness, deformity or swelling Cardiovascular: Normal rate and regular rhythm.  Respiratory: Effort normal, non-labored breathing saturations WNL on 2L [place] GI: Soft.  No distension. There is no tenderness.  Skin: WDI  Neurological Examination Mental Status: Alert, does not follow commands. Can hold arms up if passively raised. Cranial Nerves: II: right hemaniopsia III,IV, VI: ptosis not present, preferred gaze to left, unable to cross midline to the right. pupils equal, round, reactive to light  V,VII: question right facial droop VIII: hearing normal bilaterally IX,X: uvula rises midline XI: bilateral shoulder shrug XII: midline tongue extension Motor/Sensory: Able to lift all 4 anti gravity, but both legs fall to bed. Withdraws equally to noxious in all 4 extremities.  Tone and bulk:normal tone throughout; no atrophy noted Cerebellar: No ataxia noted Gait: deferred   Lab Results: Basic Metabolic Panel: Recent Labs  Lab 07/29/19 1130  NA 135  K 3.8  CL 96*  GLUCOSE 225*  BUN 16  CREATININE 0.50*    CBC: Recent Labs  Lab 2019-07-29 1127 07/29/2019 1130   WBC 12.2*  --   NEUTROABS 10.8*  --   HGB 13.2 13.9  HCT 39.7 41.0  MCV 88.8  --   PLT 258  --     CBG: Recent Labs  Lab 07-29-2019 1124  GLUCAP 220*    Imaging: CT HEAD CODE STROKE WO CONTRAST  Result Date: 29-Jul-2019 CLINICAL DATA:  Code stroke.  Right leg weakness.  Aphasia. EXAM: CT HEAD WITHOUT  CONTRAST TECHNIQUE: Contiguous axial images were obtained from the base of the skull through the vertex without intravenous contrast. COMPARISON:  01/26/2018 FINDINGS: Brain: Generalized atrophy. Chronic small-vessel ischemic changes affecting the cerebral hemispheric white matter. No sign of acute infarction, mass lesion, hemorrhage, hydrocephalus or extra-axial collection. Vascular: There is atherosclerotic calcification of the major vessels at the base of the brain. Skull: Negative Sinuses/Orbits: Clear/normal Other: None ASPECTS (Alberta Stroke Program Early CT Score) - Ganglionic level infarction (caudate, lentiform nuclei, internal capsule, insula, M1-M3 cortex): 7 - Supraganglionic infarction (M4-M6 cortex): 3 Total score (0-10 with 10 being normal): 10 IMPRESSION: 1. No acute finding. Atrophy and chronic small-vessel ischemic changes. 2. ASPECTS is 10 3. These results were communicated to Dr. Wilford Corner at 11:36 amon 01/21/2021by text page via the [facility] messaging system. Electronically Signed   By: Paulina Fusi M.D.   On: 06/12/2019 11:37    Valentina Lucks, MSN, NP-C [facility] [phone]  06/12/2019, 11:31 AM   Attending physician note to follow with Assessment and plan .  Attending addendum Patient seen and examined as code stroke Appears generally encephalopathic with left gaze preference and bilateral lower extremity weakness.  No upper extremity weakness Does not follow commands consistently-only able to hold arms up when passively raised. CT with no bleed.  CTA with no LVO.  CT perfusion with no perfusion deficit. Less likely a stroke and more likely to be toxic  metabolic encephalopathy in the setting of uncontrolled diabetes versus possible seizures. Does have some stiffness on his neck but he has had C-spine surgery.  No Kernig's or Brudzinski sign that I could elicit.  Assessment: 85 y.o. male   With PMH DM who presented to ED with c/o aphasia and right side weakness. CTH was negative for hemorrhage. TPA was not used offered d/t presenting outside of the window. STAT EEG to r/o seizures.The CTA was negative for hemorrhage and the CTP had no perfusion deficit. Seizure versus toxic metabolic encephalopathy versus underlying infection. No likelihood of CNS infection given no meningitic findings. Stroke Risk Factors - diabetes mellitus   Recommendations: --MRI Brain  -- sepsis work up --Check Ammonia, TSH, B12 -- STAT EEG Will follow -- Milon Dikes, MD [facility] Pager: [phone] If 7pm to 7am, please call on call as listed on AMION.  CRITICAL CARE ATTESTATION Performed by: Milon Dikes, MD Total critical care time: 50 minutes Critical care time was exclusive of separately billable procedures and treating other patients and/or supervising APPs/Residents/Students Critical care was necessary to treat or prevent imminent or life-threatening deterioration due to strokelike symptoms, toxic metabolic encephalopathy, evaluation for seizures` This patient is critically ill and at significant risk for neurological worsening and/or death and care requires constant monitoring. Critical care was time spent personally by me on the following activities: development of treatment plan with patient and/or surrogate as well as nursing, discussions with consultants, evaluation of patient's response to treatment, examination of patient, obtaining history from patient or surrogate, ordering and performing treatments and interventions, ordering and review of laboratory studies, ordering and review of radiographic studies, pulse oximetry, re-evaluation of  patient's condition, participation in multidisciplinary rounds and medical decision making of high complexity in the care of this patient.  Addendum Stat EEG is unremarkable for any seizure activity.  Suggest diffuse encephalopathy. Patient was also hypothermic on arrival.  Being admitted to the hospitalist service. Please evaluate for any evidence of any sources of infection.  So far, UA is unremarkable, chest x-ray also unremarkable for infection but  question of right fourth and possibly adjacent fifth rib fracture.  Urinalysis also negative for infection reveals ketonuria. Son says that he has been having trouble with his blood sugars.  EMTs found sugars to be in upper 300s. This might be all related to metabolic processes with hyperglycemia, ketosis etc.  Recommendations as before.  Neurology will follow after imaging studies etc. are made available.  Please call with questions  -- Amie Portland, MD [facility] Pager: [phone] If 7pm to 7am, please call on call as listed on AMION.    CRITICAL CARE ATTESTATION Performed by: Amie Portland, MD Total critical care time: 55 minutes Critical care time was exclusive of separately billable procedures and treating other patients and/or supervising APPs/Residents/Students Critical care was necessary to treat or prevent imminent or life-threatening deterioration due to emergent stroke evaluation, toxic metabolic encephalopathy  This patient is critically ill and at significant risk for neurological worsening and/or death and care requires constant monitoring. Critical care was time spent personally by me on the following activities: development of treatment plan with patient and/or surrogate as well as nursing, discussions with consultants, evaluation of patient's response to treatment, examination of patient, obtaining history from patient or surrogate, ordering and performing treatments and interventions, ordering and review of  laboratory studies, ordering and review of radiographic studies, pulse oximetry, re-evaluation of patient's condition, participation in multidisciplinary rounds and medical decision making of high complexity in the care of this patient.

## 2019-06-29 NOTE — Procedures (Addendum)
Patient Name: Seth Boyd  MRN: [id number]  Epilepsy Attending: Charlsie Quest  Referring Physician/Provider: Valentina Lucks, NP Date: 07/26/19  Duration: 23.42 mins  Patient history: 85 y.o. male   With PMH DM who presented to ED with c/o aphasia and right side weakness. CTH was negative for hemorrhage. EEG to evaluate for seizure  Level of alertness: lethargic  AEDs during EEG study: None  Technical aspects: This EEG study was done with scalp electrodes positioned according to the 10-20 International system of electrode placement. Electrical activity was acquired at a sampling rate of 500Hz  and reviewed with a high frequency filter of 70Hz  and a low frequency filter of 1Hz . EEG data were recorded continuously and digitally stored.   DESCRIPTION: No clear posterior dominant rhythm was seen. EEG showed continuous generalized 3-6Hz  theta-delta slowing. Hyperventilation and photic stimulation were not performed due to ams.  ABNORMALITY - Continuous slow, generalized    IMPRESSION: This study is suggestive of moderate diffuse encephalopathy,,non specific to etiology. No seizures or epileptiform discharges were seen throughout the recording.  Milagros Middendorf 

## 2019-06-29 NOTE — H&P (Signed)
NAME:  Seth Boyd, MRN:  [id number], DOB:  Oct 29, 1934, LOS: 0 ADMISSION DATE:  06/16/2019, CONSULTATION DATE:  06/11/2019 REFERRING MD:  Ophelia Charter, CHIEF COMPLAINT:  Encephalopathy   Brief History   85 y.o. male with medical history significant of DM and HTN presenting with Code Stroke.  His son reports that he was not able to get in touch with him late yesterday afternoon.  He left his garage open and didn't set his alarm.  He went to check on him this morning and thought he heard him snoring.  He was lying there with his eyes open, chin on check, lying in floor.  Maybe he had been trying to undress yesterday when he fell.  He was unable to talk, could follow some commands.  He was making that snoring sound throughout, but may have had airway damage from neck surgery, seems to have some dysphagia.  Full code.  His son also notes that his brother was at [facility] 2 years ago with a similar problem and he is doing well now.  He couldn't talk and was making the same kind of noise.  They thought maybe it was seizure.  History of present illness   85 y.o. male with medical history significant of DM and HTN presenting with Code Stroke.  His son reports that he was not able to get in touch with him late yesterday afternoon.  He left his garage open and didn't set his alarm.  He went to check on him this morning and thought he heard him snoring.  He was lying there with his eyes open, chin on check, lying in floor.  Maybe he had been trying to undress yesterday when he fell.  He was unable to talk, could follow some commands.  He was making that snoring sound throughout, but may have had airway damage from neck surgery, seems to have some dysphagia.  Full code.  His son also notes that his brother was at [facility] 2 years ago with a similar problem and he is doing well now.  He couldn't talk and was making the same kind of noise.  They thought maybe it was seizure.  When seen by hospital service, patient was making more  sonorous sounds, not verbal, and there was concern for inability to protect his airway. They asked PCCM to admit with concern for needing intubation and mechanical ventilation.   Past Medical History   Active Ambulatory Problems    Diagnosis Date Noted  . No Active Ambulatory Problems   Resolved Ambulatory Problems    Diagnosis Date Noted  . No Resolved Ambulatory Problems   Past Medical History:  Diagnosis Date  . DM (diabetes mellitus) (HCC)   . Essential hypertension     Significant Hospital Events   As above  Consults:  PCCM Neurology  Procedures:    Significant Diagnostic Tests:  1/31--CT head 1/31--CTA neck and brain:  IMPRESSION: Negative perfusion examination. No flow limiting carotid stenosis. 30% ICA stenosis on the right. 20% on the left. No intracranial large or medium vessel occlusion. 30-50% narrowing in both carotid siphon regions but no correctable proximal stenosis. 1/31--EEG: moderate diffuse encephalopathy, no epileptiform activity 1/31 EKG: poor baseline, but NSR  Micro Data:  COVID neg  Antimicrobials:  none   Interim history/subjective:  By the time we have seen him, suctioned breathing better. See exam but no left deviation. Not very verbal but can respond  Objective   Blood pressure 123/72, pulse 88, temperature (!) 96.8 F (36  C), temperature source Rectal, resp. rate (!) 22, SpO2 92 %.       No intake or output data in the 24 hours ending 06/28/2019 1542 There were no vitals filed for this visit.  Examination: General: NAD HENT: AT/[place], MMM, dentition intact Lungs: CTA; upper airway sounds, but clear with suctioning Cardiovascular: RRR no r/m/g Abdomen: scaphoid, SND Extremities: 2+ cap refill, no edema Neuro: PERRL, EOMI, tongue midline, gag/ swallow intact, MAE with expected ROM, localizes, tries to take suction, nasal trumpet. Verbal: grunts, nods intermittently to y/n question GU: nl external anatomy  Resolved Hospital  Problem list     Assessment & Plan:  Pt found down, with stroke-like sx, neuro changes/deficits. EEG demonstrates encephalopathy, but there are no imaging or lab/metabolic abnormalities to correlate to these changes.   Now protecting airway, would admit and observe.   Encephalopathy --continue with neurology oversight, serial neuro exams --monitor glucose, renal, other metabolic indicators for encephalopathy --MRI brain   Best practice:  Diet: NPO for now Pain/Anxiety/Delirium protocol (if indicated): n/a VAP protocol (if indicated): n/a DVT prophylaxis: SCDs/heparin GI prophylaxis: H2 blocker or PPI  Glucose control: monitor Mobility: bed Code Status: full Family Communication: will update son as able Disposition: ICU  Labs   CBC: Recent Labs  Lab 06/06/2019 1127 06/04/2019 1130  WBC 12.2*  --   NEUTROABS 10.8*  --   HGB 13.2 13.9  HCT 39.7 41.0  MCV 88.8  --   PLT 258  --     Basic Metabolic Panel: Recent Labs  Lab 06/19/2019 1127 06/05/2019 1130  NA 135 135  K 3.8 3.8  CL 95* 96*  CO2 25  --   GLUCOSE 227* 225*  BUN 14 16  CREATININE 0.78 0.50*  CALCIUM 9.5  --    GFR: CrCl cannot be calculated (Unknown ideal weight.). Recent Labs  Lab 06/25/2019 1127 06/11/2019 1405  WBC 12.2*  --   LATICACIDVEN  --  1.9    Liver Function Tests: Recent Labs  Lab 06/16/2019 1127  AST 75*  ALT 43  ALKPHOS 100  BILITOT 0.6  PROT 8.0  ALBUMIN 4.2   No results for input(s): LIPASE, AMYLASE in the last 168 hours. Recent Labs  Lab 06/04/2019 1405  AMMONIA 11    ABG    Component Value Date/Time   TCO2 28 06/28/2019 1130     Coagulation Profile: Recent Labs  Lab 06/01/2019 1127  INR 1.2    Cardiac Enzymes: No results for input(s): CKTOTAL, CKMB, CKMBINDEX, TROPONINI in the last 168 hours.  HbA1C: No results found for: HGBA1C  CBG: Recent Labs  Lab 06/13/2019 1124  GLUCAP 220*    Review of Systems:   Unable to obtain as patient not verbal enough to  supply information  Past Medical History  He,  has a past medical history of DM (diabetes mellitus) (HCC) and Essential hypertension.   Surgical History    Past Surgical History:  Procedure Laterality Date  . c-spine       Social History      Family History   His family history is not on file.   Allergies No Known Allergies   Home Medications  Prior to Admission medications   Not on File     Critical care time: I have independently seen and examined the patient, reviewed data, and developed an assessment and plan with the APP. A total of 36 minutes were spent in critical care assessment and medical decision making. This critical care  time does not reflect procedure time, or teaching time or supervisory time of PA/NP/Med student/Med Resident, etc but could involve care discussion time.  Bonna Gains, MD PhD

## 2019-06-29 NOTE — Progress Notes (Signed)
PCCM interval progress note:  Pt with continued sonorous respirations and tachypnea, ABG 7.4/36.9/75/27, pt was somnolent, but arousable to voice and following commands, but quickly falling back to sleep and requiring NT suctioning.  Decision made to intubate.  This was discussed with patient's son [name] who gave consent and mentions that prior to this episode, patient had been doing very well, riding his bike and working, he had some progressive hearing loss-but no recent weakness, confusion, falls or other acute deficits.   Darcella Gasman Jennah Satchell, PA-C

## 2019-06-29 NOTE — Progress Notes (Signed)
eLink Physician-Brief Progress Note Patient Name: Seth Boyd DOB: 02-10-1935 MRN: [id number]   Date of Service  06/20/2019  HPI/Events of Note  Planned for MRI brain. Does not have PRN sedation ordered , RN would like an order. Has precedex ordered but currently not infusing  eICU Interventions  PRN fentanyl ordered if needed     Intervention Category Minor Interventions: Agitation / anxiety - evaluation and management  Oretha Milch 05/31/2019, 11:45 PM

## 2019-06-29 NOTE — ED Notes (Signed)
Report given to Jenn RN

## 2019-06-29 NOTE — Care Plan (Signed)
Son number [name]: [phone]

## 2019-06-29 NOTE — Progress Notes (Signed)
EEG complete - results pending 

## 2019-06-29 NOTE — Procedures (Addendum)
Intubation Procedure Note Seth Boyd [id number] [birth year]  Procedure: Intubation Indications: Airway protection and maintenance  Patient admitted today with AMS (opens eyes and follows simple commands, aphasic), persistent tachypnea (20s), tachycardia, A-a gradient elevation, sonorous breathing improves with jaw thrust, poor cough.   Procedure Details Consent: Risks of procedure as well as the alternatives and risks of each were explained to the (patient/caregiver).  Consent for procedure obtained.  Discussed with son. Explained to patient but lethargic.  Time Out: Verified patient identification, verified procedure, site/side was marked, verified correct patient position, special equipment/implants available, medications/allergies/relevent history reviewed, required imaging and test results available.  Performed  Maximum sterile technique was used including cap, gloves, hand hygiene and mask.  3   Airway appeared normal.  Tongue size normal.  No edema in post oropharynx (diffucult exam) Glidescope used. Epiglottis normal.  Larynx easily accessible.  Vocal cords closely apposed.  Unable to pass 7.5 ET tube through cords.  Passed 7 ET tube.      Evaluation Hemodynamic Status: Transient hypotension treated with pressors and fluid; O2 sats: stable throughout Patient's Current Condition: stable Complications: Complications of laryngospasm, transient hypotension Patient did tolerate procedure well. Chest X-ray ordered to verify placement.  CXR: pending.   Seth Boyd 06/10/2019

## 2019-06-29 NOTE — Progress Notes (Signed)
eLink Physician-Brief Progress Note Patient Name: Seth Boyd DOB: May 14, 1935 MRN: [id number]   Date of Service  06/06/2019  HPI/Events of Note  K = 3.1, creatinine 0.6  eICU Interventions  40 meq IV Kcl x 1 Mag already ordered before      Intervention Category Major Interventions: Electrolyte abnormality - evaluation and management  Annasophia Crocker G Tadashi Burkel 06/03/2019, 10:00 PM

## 2019-06-29 NOTE — ED Triage Notes (Addendum)
Pt presents to ED via Seth Boyd. EMS with AMS. Pt's son called 911 due to pt not answering his phone. Son spoke to pt 1400-1500 yesterday, called last night but pt did not answer, son did not feel that was unusual he though pt may have been at the garage still working on his transmission, son was alarmed after pt did not answer the phone this am. EMS found pt on the floor in a sitting position beside the bed.    18g LAC/RAC  188/78 80 91% RA-->94% on 2L [place]   LKW 1400-1500 06/28/2019 CBG 315   Pt with Right side neglect. NIH 17

## 2019-06-29 NOTE — ED Provider Notes (Signed)
[facility] DEPART[facility] Provider Note   CSN: [id number] Arrival date & time: 06/06/2019  1122     History No chief complaint on file.   Seth Boyd is a 85 y.o. male. Level 5 caveat secondary to patient unable to talk, severity of illness, urgent situation History from EMS HPI 85 yo male presents today as code stroke.  EMS reports patient lkn yesterday at 230 p.m.  Son came by today and thought that he was still sleeping when he pushed the door he found his father laying between the dresser and the wall..  Reported decreased ability to speak, eye gaze to right. No reported ho same, stroke, or seizure.  Prehospital bs 319.  BP 188/120s. Patient initially evaluated at bridge.  Airway appear patent. Gaze to left and does not cross midline.     No past medical history on file.  There are no problems to display for this patient.    The histories are not reviewed yet. Please review them in the "History" navigator section and refresh this [place].     No family history on file.  Social History   Tobacco Use  . Smoking status: Not on file  Substance Use Topics  . Alcohol use: Not on file  . Drug use: Not on file    Home Medications Prior to Admission medications   Not on File    Allergies    Patient has no allergy information on record.  Review of Systems   Review of Systems  Unable to perform ROS: Acuity of condition    Physical Exam Updated Vital Signs There were no vitals taken for this visit.  Physical Exam Constitutional:      General: He is not in acute distress.    Appearance: He is ill-appearing.  HENT:     Head: Normocephalic and atraumatic.     Nose: Nose normal.     Mouth/Throat:     Mouth: Mucous membranes are dry.  Eyes:     Comments: Gaze to left and does not cross midline to right   Musculoskeletal:     Cervical back: Normal range of motion.  Neurological:     Mental Status: He is alert.     Comments: Gaze  to left Arm strength appears equal Bilateral lower extremity weakness- unable to lift against gravity nonverbal     ED Results / Procedures / Treatments   Labs (all labs ordered are listed, but only abnormal results are displayed) Labs Reviewed  PROTIME-INR  APTT  CBC  DIFFERENTIAL  COMPREHENSIVE METABOLIC PANEL  I-STAT CHEM 8, ED  CBG MONITORING, ED    EKG None  Radiology CT HEAD CODE STROKE WO CONTRAST  Result Date: 06/12/2019 CLINICAL DATA:  Code stroke.  Right leg weakness.  Aphasia. EXAM: CT HEAD WITHOUT CONTRAST TECHNIQUE: Contiguous axial images were obtained from the base of the skull through the vertex without intravenous contrast. COMPARISON:  01/26/2018 FINDINGS: Brain: Generalized atrophy. Chronic small-vessel ischemic changes affecting the cerebral hemispheric white matter. No sign of acute infarction, mass lesion, hemorrhage, hydrocephalus or extra-axial collection. Vascular: There is atherosclerotic calcification of the major vessels at the base of the brain. Skull: Negative Sinuses/Orbits: Clear/normal Other: None ASPECTS ([place] Stroke Program Early CT Score) - Ganglionic level infarction (caudate, lentiform nuclei, internal capsule, insula, M1-M3 cortex): 7 - Supraganglionic infarction (M4-M6 cortex): 3 Total score (0-10 with 10 being normal): 10 IMPRESSION: 1. No acute finding. Atrophy and chronic small-vessel ischemic changes. 2. ASPECTS is 10  3. These results were communicated to Dr. Wilford Corner at 11:36 amon 2021-02-08by text page via the [facility] messaging system. Electronically Signed   By: Paulina Fusi M.D.   On: 07-07-19 11:37    Procedures Procedures (including critical care time)  Medications Ordered in ED Medications  sodium chloride flush (NS) 0.9 % injection 3 mL (has no administration in time range)    ED Course  I have reviewed the triage vital signs and the nursing notes.  Pertinent labs & imaging results that were available during my care of the  patient were reviewed by me and considered in my medical decision making (see chart for details). 12:01 PM Discussed with Dr. Jerrell Belfast.  He states the head CT does not show evidence of stroke.  However, patient continues to have inability to cross eyes to the right.  Neck does not have full active range of motion, however he attends group based previously and does not appear to be meningitic. He is getting EEG to rule out seizure Plan rectal temp, chest x-Edythe Riches, and urinalysis 1- altered mental status- patient arrived as code stroke.  No acute ct findings to explain symptoms.  No lab abnormalities to explain symptoms.  Patient hypothermic.  NO signs of infection .  CXR without infiltrate.  UDS pending. Patient continues without ability for gaze to cross midline and some generalized weakness.  2- fall likely secondary to 1  No external signs of trauma, head ct without bleed, cervical spine without fx.  CXR with 2 rib fxs on right 4th and 5th rib.    MDM Rules/Calculators/A&P                       Final Clinical Impression(s) / ED Diagnoses Final diagnoses:  Altered mental status, unspecified altered mental status type  Closed fracture of one rib of right side, initial encounter    Rx / DC Orders ED Discharge Orders    None       Margarita Grizzle, MD July 07, 2019 1615

## 2019-06-29 NOTE — Progress Notes (Addendum)
eLink Physician-Brief Progress Note Patient Name: Seth Boyd DOB: 07-17-1934 MRN: [id number]   Date of Service  06/25/2019  HPI/Events of Note  Mag 1.5, K was 3.0 from I stat earlier but normal from the earlier BMP  eICU Interventions  Check BMP now 2 gram IV mag ordered Otherwise no real change in his status, O2 sat 100, RR is 18-20 , continue close monitoring, RN to call with BMP results     Intervention Category Intermediate Interventions: Electrolyte abnormality - evaluation and management  Donelle Baba G Princella Jaskiewicz 06/14/2019, 8:22 PM   8.35 pm Bedside RN called to clarify that patient appears worse Gurgling on camera, though no overt distress Has a nasal trumpet in, poor mental status ABG ordered Bedside ICU team notified and will assess for intubation   9.35 am  Discussed with bedside ICU team who had already done a thorough evaluation. Intermittent tachypnea, along with poor mentation, plans to intubate. Please call E link if needed

## 2019-06-30 ENCOUNTER — Inpatient Hospital Stay (HOSPITAL_COMMUNITY): Payer: Medicare HMO

## 2019-06-30 DIAGNOSIS — R401 Stupor: Secondary | ICD-10-CM

## 2019-06-30 DIAGNOSIS — G929 Unspecified toxic encephalopathy: Secondary | ICD-10-CM | POA: Diagnosis present

## 2019-06-30 DIAGNOSIS — R569 Unspecified convulsions: Secondary | ICD-10-CM

## 2019-06-30 DIAGNOSIS — G92 Toxic encephalopathy: Secondary | ICD-10-CM

## 2019-06-30 DIAGNOSIS — R4182 Altered mental status, unspecified: Secondary | ICD-10-CM | POA: Diagnosis present

## 2019-06-30 LAB — BASIC METABOLIC PANEL
Anion gap: 12 (ref 5–15)
BUN: 15 mg/dL (ref 8–23)
CO2: 23 mmol/L (ref 22–32)
Calcium: 8.8 mg/dL — ABNORMAL LOW (ref 8.9–10.3)
Chloride: 102 mmol/L (ref 98–111)
Creatinine, Ser: 0.8 mg/dL (ref 0.61–1.24)
GFR calc Af Amer: >60 mL/min (ref >60)
GFR calc non Af Amer: >60 mL/min (ref >60)
Glucose, Bld: 136 mg/dL — ABNORMAL HIGH (ref 70–99)
Potassium: 3.2 mmol/L — ABNORMAL LOW (ref 3.5–5.1)
Sodium: 137 mmol/L (ref 135–145)

## 2019-06-30 LAB — MAGNESIUM
Magnesium: 1.7 mg/dL (ref 1.7–2.4)
Magnesium: 2 mg/dL (ref 1.7–2.4)

## 2019-06-30 LAB — PROCALCITONIN: Procalcitonin: 0.25 ng/mL

## 2019-06-30 LAB — PHOSPHORUS: Phosphorus: 2.6 mg/dL (ref 2.5–4.6)

## 2019-06-30 LAB — VITAMIN B12: Vitamin B-12: 285 pg/mL (ref 180–914)

## 2019-06-30 LAB — CK: Total CK: 1448 U/L — ABNORMAL HIGH (ref 49–397)

## 2019-06-30 LAB — ACETAMINOPHEN LEVEL: Acetaminophen (Tylenol), Serum: <10 ug/mL — ABNORMAL LOW (ref 10–30)

## 2019-06-30 LAB — TSH: TSH: 2.606 u[IU]/mL (ref 0.350–4.500)

## 2019-06-30 LAB — SALICYLATE LEVEL: Salicylate Lvl: <7 mg/dL — ABNORMAL LOW (ref 7.0–30.0)

## 2019-06-30 MED ORDER — LACTATED RINGERS IV SOLN: INTRAVENOUS | Status: DC

## 2019-06-30 MED ORDER — MAGNESIUM SULFATE 2 GM/50ML IV SOLN
2.0000 g | Freq: Once | INTRAVENOUS | Status: AC
  Administered 2019-06-30: 11:00:00 2 g via INTRAVENOUS
  Filled 2019-06-30: qty 50

## 2019-06-30 MED ORDER — POTASSIUM CHLORIDE 10 MEQ/100ML IV SOLN
INTRAVENOUS | Status: AC
  Administered 2019-06-30: 05:00:00 10 meq
  Filled 2019-06-30: qty 100

## 2019-06-30 MED ORDER — LEVETIRACETAM IN NACL 500 MG/100ML IV SOLN
500.0000 mg | Freq: Two times a day (BID) | INTRAVENOUS | Status: DC
  Administered 2019-06-30 – 2019-07-02 (×5): 500 mg via INTRAVENOUS
  Filled 2019-06-30 (×5): qty 100

## 2019-06-30 MED ORDER — ORAL CARE MOUTH RINSE
15.0000 mL | OROMUCOSAL | Status: DC
  Administered 2019-06-30 – 2019-07-01 (×11): 15 mL via OROMUCOSAL

## 2019-06-30 MED ORDER — POTASSIUM CHLORIDE 10 MEQ/100ML IV SOLN
10.0000 meq | INTRAVENOUS | Status: AC
  Administered 2019-06-30 (×3): 10 meq via INTRAVENOUS
  Filled 2019-06-30 (×3): qty 100

## 2019-06-30 MED ORDER — VITAL AF 1.2 CAL PO LIQD
1000.0000 mL | ORAL | Status: DC
  Administered 2019-07-05 – 2019-07-06 (×2): 1000 mL

## 2019-06-30 MED ORDER — LEVETIRACETAM IN NACL 1000 MG/100ML IV SOLN
1000.0000 mg | Freq: Once | INTRAVENOUS | Status: AC
  Administered 2019-06-30: 1000 mg via INTRAVENOUS
  Filled 2019-06-30: qty 100

## 2019-06-30 MED ORDER — CHLORHEXIDINE GLUCONATE 0.12% ORAL RINSE (MEDLINE KIT)
15.0000 mL | Freq: Two times a day (BID) | OROMUCOSAL | Status: DC
  Administered 2019-06-30 – 2019-07-01 (×3): 15 mL via OROMUCOSAL

## 2019-06-30 MED ORDER — GADOBUTROL 1 MMOL/ML IV SOLN
7.5000 mL | Freq: Once | INTRAVENOUS | Status: AC | PRN
  Administered 2019-06-30: 6.2 mL via INTRAVENOUS

## 2019-06-30 NOTE — Procedures (Signed)
Patient Name: Seth Boyd  MRN: [id number]  Epilepsy Attending: Charlsie Quest  Referring Physician/Provider: Dr Georgiana Spinner Aroor Date: 06/30/2019  Duration: 22.13 mins  Patient history: 85 y.o.malewith PMH DM who presented to ED with c/o aphasia and right side weakness. CTH was negative for hemorrhage. EEG to evaluate for seizure  Level of alertness: awake  AEDs during EEG study: LEV  Technical aspects: This EEG study was done with scalp electrodes positioned according to the 10-20 International system of electrode placement. Electrical activity was acquired at a sampling rate of 500Hz  and reviewed with a high frequency filter of 70Hz  and a low frequency filter of 1Hz . EEG data were recorded continuously and digitally stored.   DESCRIPTION: No clear posterior dominant rhythm was seen. EEG showed continuous generalized 3-6Hz  theta-delta slowing. Hyperventilation and photic stimulation were not performed due to ams.  ABNORMALITY - Continuous slow, generalized    IMPRESSION: This study is suggestive of moderate diffuse encephalopathy,,non specific to etiology. No seizures or epileptiform discharges were seen throughout the recording.  EEG appears simi;lar to previous study performed on 06/06/2019    [name] 

## 2019-06-30 NOTE — Progress Notes (Addendum)
NAME:  Seth Boyd, MRN:  [id number], DOB:  [birth year], LOS: 1 ADMISSION DATE:  20-Jul-2019, CONSULTATION DATE:  2019/07/20 REFERRING MD:  Ophelia Charter, CHIEF COMPLAINT:  Encephalopathy   Brief History   85 y.o. male, goes by Seth Boyd, with prior hx HTN/ DM presenting after being found on floor with acute encephalopathy.  Presented as code stroke.  Noted to have initial left gaze.  LSW 1/30 ~4 pm.  Rule out for stroke with negative CTH/ CTA.  Concern for seizure +/- toxic/ metabolic encephalopathy.    Past Medical History  HTN, DM  Significant Hospital Events   1/31 Admit  Consults:  PCCM Neurology  Procedures:  1/31 ETT >>  Significant Diagnostic Tests:  1/31--CT head 1/31--CTA neck and brain:  Negative perfusion examination. No flow limiting carotid stenosis. 30% ICA stenosis on the right. 20% on the left. No intracranial large or medium vessel occlusion. 30-50% narrowing in both carotid siphon regions but no correctable proximal stenosis.  1/31--EEG: moderate diffuse encephalopathy, no epileptiform activity 1/31 EKG: poor baseline, but NSR  2/1 brain MRI >> 1. No acute intracranial process identified. 2. Moderate cerebral atrophy with chronic microvascular ischemic disease. 3. Soft tissue edema within the right occipital/suboccipital scalp, of uncertain etiology. Query recent fall. Correlation with physical exam recommended.  Micro Data:  1/31 SARS 2/ flu A/B >> neg 1/31 MRSA PCR >> negative  Antimicrobials:  n/a  Interim history/subjective:  Required intubation overnight for airway protection.  Hemodynamically stable No sedation required thus far. Weaning well on SBT this am  Followed commands for RN this am   Objective   Blood pressure (!) 115/55, pulse 67, temperature 99.7 F (37.6 C), temperature source Axillary, resp. rate 18, height 5\' 10"  (1.778 m), weight 62.6 kg, SpO2 99 %.    Vent Mode: PRVC FiO2 (%):  [36 %-60 %] 40 % Set Rate:  [18 bmp] 18 bmp Vt Set:   [560 mL-600 mL] 560 mL PEEP:  [5 cmH20] 5 cmH20 Plateau Pressure:  [15 cmH20-17 cmH20] 15 cmH20   Intake/Output Summary (Last 24 hours) at 06/30/2019 08/28/2019 Last data filed at 06/30/2019 0600 Gross per 24 hour  Intake 1802.97 ml  Output 325 ml  Net 1477.97 ml   Filed Weights   07/20/2019 1815  Weight: 62.6 kg   Examination:  General:  Elderly male on MV in NAD HEENT: MM pink/moist, copious oral secretions, ETT, no OGT, pupils 2/reactive- looks away from light, anicteric, no obvious nuchal rigidity   Neuro: Opens eyes to voice, localizes to pain, seems to move right side more but MAE, and will not cross midline/ right gaze preferrence CV: rr, no murmur PULM:  Non labored on 10/5, doing well, slightly coarse on right, left clear  GI: soft, bs active, condom cath w/cyu Extremities: warm/dry, no edema  Skin: no rashes   Resolved Hospital Problem list     Assessment & Plan:   Acute encephalopathy- still concerning for possible subclinical seizures vs toxic/ metabolic  - initial EEG yest c/w encephalopathy - CTH/ CTA/ MRI without acute process  - no obvious etiology at this point P:  Spoke with neurology, starting LTM- if negative, ? Consider LP AED per neurology Frequent neuro exams  Initial PCT neg, trend PCT, trend fever curve to rule out possible infections TSH, ammonia, UDS negative  Continue supportive care   Acute respiratory insufficiency related to above R/o aspiration pna Likely right rib fx non displaced - post intubation CXR concerning for developing right infrahilar opacity P:  Full MV support, PRVC 8 cc/kg, drop rate to 12 Daily SBT- doing well, but ongoing copious oral secretions and mental status still waxes/ wanes and is a barrier to extubation currently Trend CXR VAP bundle Prn fentanyl for RASS goal 0 (has not required sedation thus far) Monitor clinically for now for abx, if fever or increasing vent needs would initiate abx    Mild Rhabdomyolysis  P:    LR at 125 ml/hr  Monitor UOP / strict I/Os Recheck CK in am    Hypokalemia P:  Mag 1.7, will give Mag 2gm and KCL x 3 runs  Recheck BMP/ mag in am   Hx HTN P:  Hold home ASA, lipitor lisinopril/ HTCZ Will need OGT   DM P:  CBG q 4 Add SSI if needed   Best practice:  Diet: NPO  Pain/Anxiety/Delirium protocol (if indicated): prn fentanyl  VAP protocol (if indicated): n/a DVT prophylaxis: SCDs/heparin GI prophylaxis: H2 blocker or PPI  Glucose control: monitor Mobility: bed Code Status: full Family Communication: son, [name] updated by phone Disposition: ICU  Labs   CBC: Recent Labs  Lab 06/02/2019 1127 06/02/2019 1127 06/16/2019 1130 06/04/2019 1610 06/14/2019 1623 06/18/2019 2050 06/18/2019 2245  WBC 12.2*  --   --  12.7*  --   --   --   NEUTROABS 10.8*  --   --   --   --   --   --   HGB 13.2   < > 13.9 13.5 13.3 13.6 12.6*  HCT 39.7   < > 41.0 39.8 39.0 40.0 37.0*  MCV 88.8  --   --  87.1  --   --   --   PLT 258  --   --  269  --   --   --    < > = values in this interval not displayed.    Basic Metabolic Panel: Recent Labs  Lab 06/17/2019 1127 06/05/2019 1127 06/12/2019 1130 06/28/2019 1610 06/25/2019 1623 06/28/2019 2050 06/06/2019 2059 06/13/2019 2244 06/01/2019 2245 06/30/19 0652  NA 135   < > 135  --    < > 136 138 138 138 137  K 3.8   < > 3.8  --    < > 3.0* 3.1* 2.7* 2.6* 3.2*  CL 95*  --  96*  --   --   --  98 103  --  102  CO2 25  --   --   --   --   --  25 24  --  23  GLUCOSE 227*  --  225*  --   --   --  157* 144*  --  136*  BUN 14  --  16  --   --   --  16 12  --  15  CREATININE 0.78   < > 0.50* 0.65  --   --  0.67 0.65  --  0.80  CALCIUM 9.5  --   --   --   --   --  9.4 8.9  --  8.8*  MG  --   --   --  1.5*  --   --   --   --   --  1.7  PHOS  --   --   --  2.6  --   --   --   --   --   --    < > = values in this interval not displayed.   GFR: Estimated Creatinine Clearance:  63 mL/min (by C-G formula based on SCr of 0.8 mg/dL). Recent Labs  Lab  06/23/2019 1127 06/14/2019 1405 06/22/2019 1610  PROCALCITON  --   --  0.14  WBC 12.2*  --  12.7*  LATICACIDVEN  --  1.9  --     Liver Function Tests: Recent Labs  Lab 06/01/2019 1127 06/08/2019 2244  AST 75* 79*  ALT 43 42  ALKPHOS 100 86  BILITOT 0.6 1.2  PROT 8.0 6.5  ALBUMIN 4.2 3.4*   No results for input(s): LIPASE, AMYLASE in the last 168 hours. Recent Labs  Lab 06/27/2019 1405  AMMONIA 11    ABG    Component Value Date/Time   PHART 7.524 (H) 06/27/2019 2245   PCO2ART 30.2 (L) 06/27/2019 2245   PO2ART 145.0 (H) 06/18/2019 2245   HCO3 25.0 06/09/2019 2245   TCO2 26 06/25/2019 2245   O2SAT 100.0 06/22/2019 2245     Coagulation Profile: Recent Labs  Lab 06/10/2019 1127  INR 1.2    Cardiac Enzymes: Recent Labs  Lab 06/02/2019 1610 06/30/19 0342  CKTOTAL 3,313* 1,448*  CKMB 109.1*  --     HbA1C: No results found for: HGBA1C  CBG: Recent Labs  Lab 06/01/2019 1124 05/30/2019 2008  GLUCAP 220* 147*    CCT 40 mins   Posey Boyer, MSN, AGACNP-BC  Pulmonary & Critical Care 06/30/2019, 9:10 AM

## 2019-06-30 NOTE — Progress Notes (Signed)
Interim Note  Patient was being intubated by PCCM due to lethargic, sonorous respirations and increased tachypnea as well as walking by the neuro ICU.  Discussed case with critical care team and recommended obtain stat MRI brain with and without to assess whether he has had a stroke and to see if he has any evidence of encephalitis.   Patient had just been paralyzed for intubation, and so unable to do an exam.  According to CCM just prior to intubation he was following simple commands but was nonverbal.  Reviewed his chart, presented after being found down by his son.  Had aphasia and right-sided weakness on presentation and had left gaze deviation: CT unremarkable for LVO and stat EEG was unremarkable for seizures/nonconvulsive status epilepticus.  Patient afebrile and therefore LP not performed.  I agree less likely to be HSV encephalitis in the absence of fever, meningismus and MRI not suggestive of any signal changes.  Recommendations Given his story of left gaze preference, and right-sided weakness: I wonder if patient had a seizure and was postictal.  Would recommend starting patient empirically on Keppra repeating EEG tomorrow

## 2019-06-30 NOTE — Progress Notes (Signed)
Initial Nutrition Assessment  DOCUMENTATION CODES:   Severe malnutrition in context of chronic illness  INTERVENTION:   Initiate Vital AF 1.2 @ 30 ml/hr and increase by 10 ml every 8 hours to goal rate of 60 ml/hr   Provides: 1728 kcal, 108 grams protein, and 1167 ml free water.   Monitor magnesium and phosphorus every 12 hours, MD to replete as needed, as pt is at risk for refeeding syndrome given severe malnutrition.   NUTRITION DIAGNOSIS:   Severe Malnutrition related to (likely chronic illness) as evidenced by severe fat depletion, severe muscle depletion.  GOAL:   Patient will meet greater than or equal to 90% of their needs  MONITOR:   I & O's, Vent status  REASON FOR ASSESSMENT:   Ventilator    ASSESSMENT:   Pt with PMH of HTN and DM admitted with acute encephalopathy.    Pt discussed during ICU rounds and with RN.   Pt required intubation overnight. Work up for acute encephalopathy ongoing. Pt on continuous EEG to monitor for seizures.  Ok with CCM to initiate TF.   Patient is currently intubated on ventilator support MV: 10.2 L/min Temp (24hrs), Avg:98.7 F (37.1 C), Min:98 F (36.7 C), Max:99.7 F (37.6 C)  Medications reviewed and include: 10 mEq KCl x 3  Labs reviewed: K+ 3.2 (L)    NUTRITION - FOCUSED PHYSICAL EXAM:    Most Recent Value  Orbital Region  Severe depletion  Upper Arm Region  Severe depletion  Thoracic and Lumbar Region  Severe depletion  Buccal Region  Unable to assess  Temple Region  Severe depletion  Clavicle Bone Region  Severe depletion  Clavicle and Acromion Bone Region  Severe depletion  Scapular Bone Region  Severe depletion  Dorsal Hand  Severe depletion  Patellar Region  Severe depletion  Anterior Thigh Region  Severe depletion  Posterior Calf Region  Severe depletion  Edema (RD Assessment)  None  Hair  Reviewed  Eyes  Unable to assess  Mouth  Unable to assess  Skin  Reviewed  Nails  Reviewed       Diet  Order:   Diet Order            Diet NPO time specified  Diet effective now              EDUCATION NEEDS:   No education needs have been identified at this time  Skin:  Skin Assessment: Reviewed RN Assessment  Last BM:  unknown  Height:   Ht Readings from Last 1 Encounters:  2019/07/21 5\' 10"  (1.778 m)    Weight:   Wt Readings from Last 1 Encounters:  07/21/19 62.6 kg    Ideal Body Weight:  75.4 kg  BMI:  Body mass index is 19.8 kg/m.  Estimated Nutritional Needs:   Kcal:  1668  Protein:  90-120 grams  Fluid:  2 L/day  07/01/19 RD, LDN, CNSC [phone] Pager [phone] After Hours Pager

## 2019-06-30 NOTE — Progress Notes (Signed)
EEG completed, results pending. 

## 2019-06-30 NOTE — Progress Notes (Signed)
eLink Physician-Brief Progress Note Patient Name: Mauro Arps DOB: 17-Oct-1934 MRN: [id number]   Date of Service  06/30/2019  HPI/Events of Note  Maintenance fluids  eICU Interventions  LR at 125 cc/hour      Intervention Category Minor Interventions: Routine modifications to care plan (e.g. PRN medications for pain, fever)  Oretha Milch 06/30/2019, 3:17 AM

## 2019-06-30 NOTE — Progress Notes (Addendum)
NEUROLOGY PROGRESS NOTE   Subjective: Patient is now intubated, on no sedation, follows no commands.  Patient required intubation for airway protection overnight. Nurse states he followed commands this AM.   Exam: Vitals:   06/30/19 0740 06/30/19 0800  BP:    Pulse:    Resp:    Temp:  99.7 F (37.6 C)  SpO2: 99%     Physical Exam  Constitutional: Appears well-developed and well-nourished.  Eyes: No scleral injection HENT: No OP obstrucion Head: Normocephalic.  Cardiovascular: Palpable pulses Respiratory: Intubated GI: Soft.  No distension. There is no tenderness.  Skin: WDI   Neuro:  Mental Status: Withdraws from painfull stimuli, localizes to sternal rub, no attempts to vocalize Cranial Nerves: II: Patient does not respond to confrontation bilaterally  III,IV,VI: Right gaze preference is noted (in previous note it was stated to be a left gaze preference). Still does not cross midline. pupils right 2 mm, left 2 mm and reactive bilaterally, eyes are disconjugate V,VII: Corneal reflexes present bilaterally  VIII: Opens eyes to verbal stimuli IX,X: Gag reflex present XI: Trapezius strength unable to test bilaterally XII: Tongue strength unable to test Motor: Moving all extremities antigravity, right greater than left, with spontaneous movement.   Purposeful movements noted with noxious stim. Sensory: As above Deep Tendon Reflexes: Present bilateral upper extremities. Plantars: upgoing bilaterally with plantar stimulation Cerebellar: Unable to perform  Medications:  Scheduled: . chlorhexidine gluconate (MEDLINE KIT)  15 mL Mouth Rinse BID  . Chlorhexidine Gluconate Cloth  6 each Topical Q0600  . heparin  5,000 Units Subcutaneous Q8H  . mouth rinse  15 mL Mouth Rinse 10 times per day  . pantoprazole (PROTONIX) IV  40 mg Intravenous QHS   Continuous: . dexmedetomidine (PRECEDEX) IV infusion    . lactated ringers 125 mL/hr at 06/30/19 0600  . levETIRAcetam    .  phenylephrine (NEO-SYNEPHRINE) Adult infusion Stopped (06/30/19 0350)  . phenylephrine     HDQ:[id number], fentaNYL (SUBLIMAZE) injection, fentaNYL (SUBLIMAZE) injection, ondansetron (ZOFRAN) IV  Pertinent Labs/Diagnostics: -B12 low at 285 -Potassium 3.2 which has increased from 2.6 a day ago -Ammonia 11 -UA negative  DG Abd 1 View Result Date: 06/28/2019 CLINICAL DATA:  Abdominal distension. EXAM: ABDOMEN - 1 VIEW COMPARISON:  None. FINDINGS: The bowel gas pattern is normal. No radio-opaque calculi or other significant radiographic abnormality are seen. IMPRESSION: Negative. Electronically Signed   By: Constance Holster M.D.   On: 06/10/2019 22:45   MR BRAIN W WO CONTRAST Result Date: 06/30/2019  IMPRESSION: 1. No acute intracranial process identified. 2. Moderate cerebral atrophy with chronic microvascular ischemic disease. 3. Soft tissue edema within the right occipital/suboccipital scalp, of uncertain etiology. Query recent fall. Correlation with physical exam recommended. Electronically Signed   By: Jeannine Boga M.D.   On: 06/30/2019 01:43   CT C-SPINE NO CHARGE Result Date: 06/08/2019  IMPRESSION: No traumatic cervical finding. 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 Electronically Signed   By: Nelson Chimes M.D.   On: 06/17/2019 12:06   CT Code Stroke Cerebral Perfusion with contrast Result Date: 06/08/2019 IMPRESSION: Negative perfusion examination. No flow limiting carotid stenosis. 30% ICA stenosis on the right. 20% on the left. No intracranial large or medium vessel occlusion. 30-50% narrowing in both carotid siphon regions but no correctable proximal stenosis. These results were communicated to Dr. Rory Percy at 12:02 pmon 1/31/2021by text page via the [facility] messaging system. Electronically Signed   By: Nelson Chimes M.D.   On:  06/16/2019 12:02   DG CHEST PORT 1 VIEW Result Date: 06/26/2019 CLINICAL DATA:  Endotracheal tube placement EXAM: PORTABLE  CHEST 1 VIEW COMPARISON:  06/14/2019 FINDINGS: The endotracheal tube terminates above the carina by approximately 4.8 cm. There is an infrahilar airspace opacity on the right. There is no pneumothorax. No large pleural effusion. There is no acute osseous abnormality. The heart size is stable. Aortic calcifications are noted. IMPRESSION: 1. Endotracheal tube as above. 2. Developing right infrahilar airspace opacity concerning for pneumonia. Electronically Signed   By: Constance Holster M.D.   On: 06/09/2019 22:44   DG Chest Port 1 View Result Date: 06/02/2019 IMPRESSION: 1. No active disease within the chest. 2. Question minimally displaced fracture of the lateral aspect of the right fourth rib and possibly the adjacent right fifth rib. Correlation with point tenderness at these sites is recommended. Electronically Signed   By: Davina Poke D.O.   On: 06/01/2019 12:36   EEG adult Result Date: 06/17/2019 IMPRESSION: This study is suggestive of moderate diffuse encephalopathy, non specific to etiology. No seizures or epileptiform discharges were seen throughout the recording. Lora Havens   CT HEAD CODE STROKE WO CONTRAST Result Date: 06/15/2019 IMPRESSION: 1. No acute finding. Atrophy and chronic small-vessel ischemic changes. 2. ASPECTS is 10 3. These results were communicated to Dr. Rory Percy at 11:36 amon 01/17/2021by text page via the [facility] messaging system. Electronically Signed   By: Nelson Chimes M.D.   On: 06/12/2019 11:37    Etta Quill PA-C [facility] [phone]  Assessment: 85 year old male who initially presented as a code stroke. Was encephalopathic with left gaze preference and bilateral lower extremity weakness. Exam yesterday showed a right hemianopsia and left gaze deviation as well as questionable right facial droop. No acute abnormality was seen on MRI brain. No LVO seen on CTA. Of note, son stated that the patient has been having trouble with his blood sugars; EMTs  found sugars to be in upper 300s. 1. Unfortunately, today's exam is limited secondary to patient being intubated.  He is not responding to pain, has disconjugate gaze and no blink to threat, but corneal and pupillary reflexes are intact. Bilateral upgoing toes with what looks like to be a triple flexion response.   2. Spot EEG is most consistent with a moderate diffuse encephalopathy. No electrographic seizures noted.  3. At this point MRI brain and EEG have not shown any etiology for the patient's encephalopathy.   4. The patient has remained afebrile with a slightly elevated white blood cell count of 12.6. 5. B12 level is low at 285 and would like to see up in the 600s 6. Elevated CK consistent with mild rhabdomyolysis. 7. Tox screen was negative. TSH normal. LFTs unremarkable. Ionized Ca normal. Mg normal. BUN/Cr normal. Ammonia normal.  8. Most likely etiology for the patient's encephalopathy is felt to be an unwitnessed seizure followed by postictal state. Recurrent subclinical seizures possibly missed by today's EEG is also on the DDx given waxing/waning mental status on exam.    Recommendations: -- LTM EEG has been ordered -- Continue Keppra 500 mg IV BID. Was loaded with 1000 mg today at 0615 -- Limit sedation   Electronically signed: Dr. Kerney Elbe 06/30/2019, 9:13 AM

## 2019-06-30 NOTE — Progress Notes (Signed)
vLTM started after following spot EEG. Notified Neuro

## 2019-06-30 DEATH — deceased

## 2019-07-01 ENCOUNTER — Inpatient Hospital Stay (HOSPITAL_COMMUNITY): Payer: Medicare HMO

## 2019-07-01 DIAGNOSIS — J9601 Acute respiratory failure with hypoxia: Secondary | ICD-10-CM

## 2019-07-01 DIAGNOSIS — E43 Unspecified severe protein-calorie malnutrition: Secondary | ICD-10-CM | POA: Diagnosis present

## 2019-07-01 LAB — CBC
HCT: 35.8 % — ABNORMAL LOW (ref 39.0–52.0)
Hemoglobin: 11.7 g/dL — ABNORMAL LOW (ref 13.0–17.0)
MCH: 29.3 pg (ref 26.0–34.0)
MCHC: 32.7 g/dL (ref 30.0–36.0)
MCV: 89.5 fL (ref 80.0–100.0)
Platelets: 183 10*3/uL (ref 150–400)
RBC: 4 MIL/uL — ABNORMAL LOW (ref 4.22–5.81)
RDW: 12.5 % (ref 11.5–15.5)
WBC: 9.2 10*3/uL (ref 4.0–10.5)
nRBC: 0 % (ref 0.0–0.2)

## 2019-07-01 LAB — RENAL FUNCTION PANEL
Albumin: 2.7 g/dL — ABNORMAL LOW (ref 3.5–5.0)
Anion gap: 13 (ref 5–15)
BUN: 14 mg/dL (ref 8–23)
CO2: 22 mmol/L (ref 22–32)
Calcium: 8.7 mg/dL — ABNORMAL LOW (ref 8.9–10.3)
Chloride: 102 mmol/L (ref 98–111)
Creatinine, Ser: 0.66 mg/dL (ref 0.61–1.24)
GFR calc Af Amer: >60 mL/min (ref >60)
GFR calc non Af Amer: >60 mL/min (ref >60)
Glucose, Bld: 112 mg/dL — ABNORMAL HIGH (ref 70–99)
Phosphorus: 2.3 mg/dL — ABNORMAL LOW (ref 2.5–4.6)
Potassium: 3.3 mmol/L — ABNORMAL LOW (ref 3.5–5.1)
Sodium: 137 mmol/L (ref 135–145)

## 2019-07-01 LAB — MAGNESIUM: Magnesium: 1.9 mg/dL (ref 1.7–2.4)

## 2019-07-01 LAB — CK: Total CK: 367 U/L (ref 49–397)

## 2019-07-01 IMAGING — DX DG CHEST 1V PORT
1 series · 1 of 1 positions shown · non-contrast
Comparison: [DATE]

CLINICAL DATA: Increased work of breathing

EXAM:
PORTABLE CHEST 1 VIEW

[chest ap]
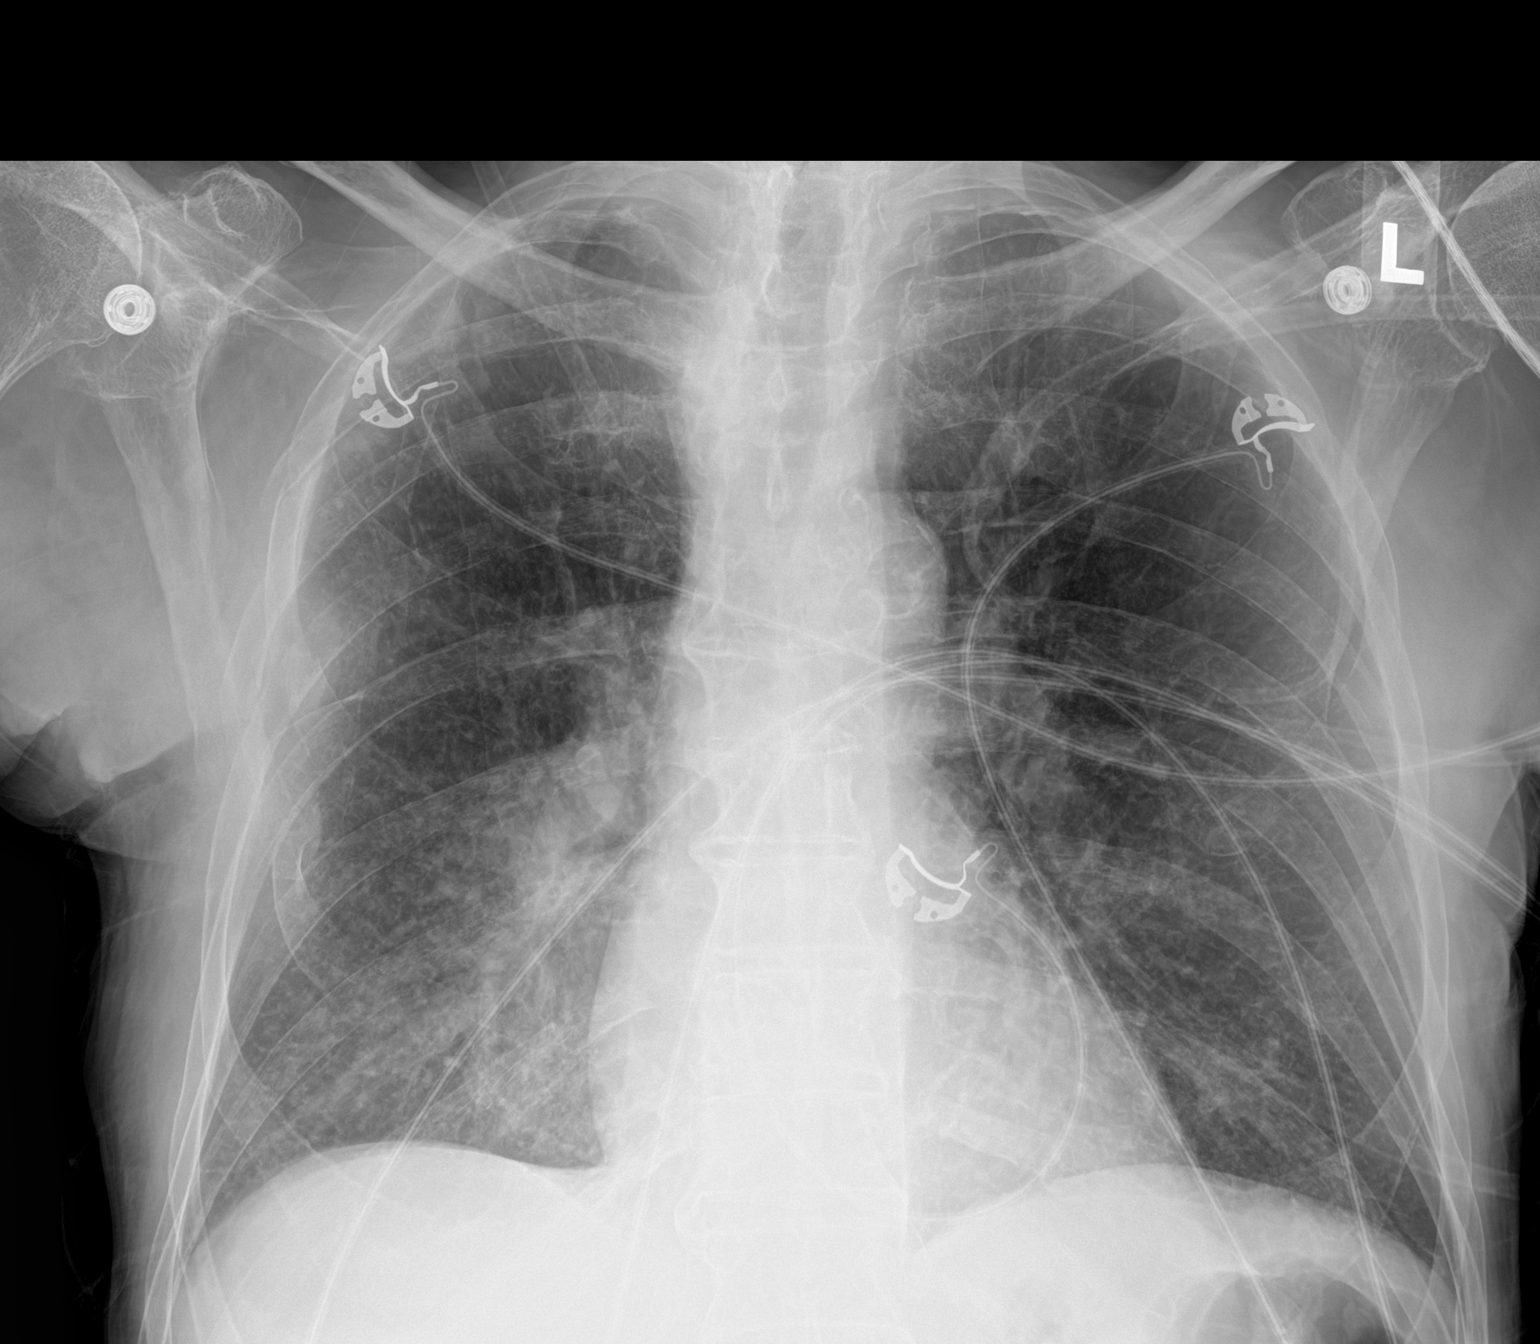

[1 of 1 positions shown; findings below may reference images not displayed]

FINDINGS: The heart size and mediastinal contours are within normal limits.
Slightly increased right infrahilar opacity. The visualized skeletal
structures are unremarkable.
IMPRESSION: Slightly increased right infrahilar opacity, which may indicate
developing consolidation.

## 2019-07-01 MED ORDER — LABETALOL HCL 5 MG/ML IV SOLN: 20.0000 mg | INTRAVENOUS | Status: DC | PRN

## 2019-07-01 MED ORDER — VITAMIN B-12 1000 MCG PO TABS
2000.0000 ug | ORAL_TABLET | Freq: Every day | ORAL | Status: DC
  Filled 2019-07-01 (×3): qty 2

## 2019-07-01 MED ORDER — CHLORHEXIDINE GLUCONATE CLOTH 2 % EX PADS
6.0000 | MEDICATED_PAD | Freq: Every day | CUTANEOUS | Status: DC
  Administered 2019-07-02 – 2019-07-06 (×4): 6 via TOPICAL

## 2019-07-01 MED ORDER — MAGNESIUM SULFATE 2 GM/50ML IV SOLN
2.0000 g | Freq: Once | INTRAVENOUS | Status: AC
  Administered 2019-07-01: 2 g via INTRAVENOUS
  Filled 2019-07-01: qty 50

## 2019-07-01 MED ORDER — FUROSEMIDE 10 MG/ML IJ SOLN
40.0000 mg | Freq: Once | INTRAMUSCULAR | Status: AC
  Administered 2019-07-01: 40 mg via INTRAVENOUS
  Filled 2019-07-01: qty 4

## 2019-07-01 MED ORDER — ORAL CARE MOUTH RINSE
15.0000 mL | Freq: Two times a day (BID) | OROMUCOSAL | Status: DC
  Administered 2019-07-01 – 2019-07-02 (×3): 15 mL via OROMUCOSAL

## 2019-07-01 MED ORDER — LABETALOL HCL 5 MG/ML IV SOLN
10.0000 mg | INTRAVENOUS | Status: DC | PRN
  Administered 2019-07-01: 10 mg via INTRAVENOUS
  Administered 2019-07-01 (×2): 20 mg via INTRAVENOUS
  Filled 2019-07-01 (×3): qty 4

## 2019-07-01 MED ORDER — CHLORHEXIDINE GLUCONATE 0.12 % MT SOLN
15.0000 mL | Freq: Two times a day (BID) | OROMUCOSAL | Status: DC
  Administered 2019-07-01 – 2019-07-05 (×7): 15 mL via OROMUCOSAL
  Filled 2019-07-01 (×5): qty 15

## 2019-07-01 MED ORDER — SODIUM CHLORIDE 0.9 % IV SOLN
3.0000 g | Freq: Three times a day (TID) | INTRAVENOUS | Status: DC
  Administered 2019-07-01 – 2019-07-07 (×19): 3 g via INTRAVENOUS
  Filled 2019-07-01 (×2): qty 3
  Filled 2019-07-01: qty 8
  Filled 2019-07-01 (×4): qty 3
  Filled 2019-07-01: qty 8
  Filled 2019-07-01 (×8): qty 3
  Filled 2019-07-01: qty 8
  Filled 2019-07-01 (×2): qty 3
  Filled 2019-07-01: qty 8
  Filled 2019-07-01: qty 3

## 2019-07-01 MED ORDER — POTASSIUM PHOSPHATES 15 MMOLE/5ML IV SOLN
30.0000 mmol | Freq: Once | INTRAVENOUS | Status: AC
  Administered 2019-07-01: 30 mmol via INTRAVENOUS
  Filled 2019-07-01: qty 10

## 2019-07-01 NOTE — Progress Notes (Addendum)
PCCM Interval Progress Note  Asked to evaluate pt at bedside for hypoxia and mildly increased WOB.  Pt extubated earlier today (was intubated 1/31). Required BVM for 4 - 5 minutes then was placed on NRB.  Sats have remained 99-100%.  Pt easily arouseable to voice.  Answers basic questions but he is extremely hard of hearing. He has mildly increased WOB and would likely benefit from short term NIPPV.  Not much secretions and RN barely able to suction any out with NTS.  Will start BiPAP and hope that we can avoid re-intubation.   Seth Boyd, [place] [facility] 07/01/2019, 11:19 PM  Evaluated in early am, remains stable on bipap.  Awake and alert, but confused.  WOB ok.   [name]

## 2019-07-01 NOTE — Progress Notes (Signed)
Son, [name] updated by phone and plan of care.  All questions answered.      Posey Boyer, MSN, AGACNP-BC [facility] 07/01/2019, 9:54 AM

## 2019-07-01 NOTE — Progress Notes (Signed)
vLTM EEG complete. No skin breakdown 

## 2019-07-01 NOTE — Progress Notes (Signed)
Rehab Admissions Coordinator Note:  Patient was screened by Clois Dupes for appropriateness for an Inpatient Acute Rehab Consult per PT recs. .  At this time, we are recommending Inpatient Rehab consult. Please place order for consult.  Clois Dupes RN MSN 07/01/2019, 1:21 PM  I can be reached at [phone].

## 2019-07-01 NOTE — Procedures (Signed)
Extubation Procedure Note  Patient Details:   Name: Seth Boyd DOB: Jan 04, 1935 MRN: [id number]   Airway Documentation:    Vent end date: 07/01/19 Vent end time: 0915   Evaluation  O2 sats: stable throughout Complications: No apparent complications Patient did tolerate procedure well. Bilateral Breath Sounds: Clear, Diminished   Patient extubated to 3L  per MD order. Patient able to speak & cough post extubation. IS instructed 500x5.  Jacqulynn Cadet 07/01/2019, 9:22 AM

## 2019-07-01 NOTE — Progress Notes (Signed)
NAME:  Seth Boyd, MRN:  [id number], DOB:  January 30, 1935, LOS: 2 ADMISSION DATE:  July 11, 2019, CONSULTATION DATE:  07/11/2019 REFERRING MD:  Lorin Mercy, CHIEF COMPLAINT:  Encephalopathy   Brief History   85 y.o. male, goes by Seth Boyd, with prior hx HTN/ DM presenting after being found on floor with acute encephalopathy.  Presented as code stroke.  Noted to have initial left gaze.  LSW 1/30 ~4 pm.  Rule out for stroke with negative CTH/ CTA.  Concern for seizure +/- toxic/ metabolic encephalopathy.    Past Medical History  HTN, DM  Significant Hospital Events   1/31 Admit  Consults:  PCCM Neurology  Procedures:  1/31 ETT >>  Significant Diagnostic Tests:  1/31--CT head 1/31--CTA neck and brain:  Negative perfusion examination. No flow limiting carotid stenosis. 30% ICA stenosis on the right. 20% on the left. No intracranial large or medium vessel occlusion. 30-50% narrowing in both carotid siphon regions but no correctable proximal stenosis.  1/31--EEG: moderate diffuse encephalopathy, no epileptiform activity 1/31 EKG: poor baseline, but NSR  2/1 brain MRI >> 1. No acute intracranial process identified. 2. Moderate cerebral atrophy with chronic microvascular ischemic disease. 3. Soft tissue edema within the right occipital/suboccipital scalp, of uncertain etiology. Query recent fall. Correlation with physical exam recommended.  2/1 LTM  >> This study issuggestive of moderate diffuse encephalopathy,,non specific to etiology.No seizures or epileptiform discharges were seen throughout the recording.  Micro Data:  1/31 SARS 2/ flu A/B >> neg 1/31 MRSA PCR >> negative  Antimicrobials:  n/a  Interim history/subjective:  Doing well on SBT 5/5 currently Remains on LTM Much improved mental status today, following commands  Objective   Blood pressure (!) 156/65, pulse 86, temperature 99.6 F (37.6 C), temperature source Axillary, resp. rate 15, height 5\' 10"  (1.778 m), weight  62.5 kg, SpO2 99 %.    Vent Mode: PRVC FiO2 (%):  [40 %] 40 % Set Rate:  [12 bmp] 12 bmp Vt Set:  [560 mL] 560 mL PEEP:  [5 cmH20] 5 cmH20 Pressure Support:  [10 cmH20] 10 cmH20 Plateau Pressure:  [14 cmH20-18 cmH20] 18 cmH20   Intake/Output Summary (Last 24 hours) at 07/01/2019 0846 Last data filed at 07/01/2019 0800 Gross per 24 hour  Intake 3161.69 ml  Output 1050 ml  Net 2111.69 ml   Filed Weights   07/11/19 1815 07/01/19 0500  Weight: 62.6 kg 62.5 kg   Examination: General:  Elderly male in NAD on MV HEENT: MM pink/moist, pupils 3/reactive, no copious oral secretions Neuro: wakes easily to verbal, follows command and MAE CV: RR, no murmur PULM:  Non labored on SBT 5/5, coarse BS, some thick secretions, strong cough GI: soft, NT, bs active  Extremities: warm/dry, no LE edema  Skin: no rashes   Resolved Hospital Problem list     Assessment & Plan:   Acute encephalopathy- still concerning for possible subclinical seizures vs toxic/ metabolic  - initial EEG yest c/w encephalopathy - CTH/ CTA/ MRI without acute process  - no obvious etiology at this point with normal TSH, ammonia, negative UDS  P:  Appreciate Neurology assistance  2/1 LTM showing diffuse encephalopathy, no seizures but still can not rule out subclinical seizures; Ongoing LTM  Continuing Keppra BID (loaded 2/1) Serial neuro exams    Acute respiratory insufficiency related to above R/o aspiration pna Likely right rib fx non displaced - post intubation CXR concerning for developing right infrahilar opacity P:  Weaning well today and following commands/ alert- will  extubate  Supplemental O2 prn WBC down but temp slightly elevated, PCT increased and ongoing tan secretions will start aspiration coverage for 5 days.   PT/OT/ SLP ordered  Aggressive pulmonary hygiene   Mild Rhabdomyolysis  P:  CK clearing, 3313- [id number]- 367 D/c IVF  Trend  UOP / strict I/Os   Hypokalemia, hypophos, Mag 1.9 P:    Kphos 30 mmol and Mag 2gm today Trend renal panel/ Mag   Hx HTN P:  Resume ASA, Lipitor, lisinopril/ HTCZ when able to take POs    Hx DM P:  Trending on BMP Will check HA1c given concerns over uncontrolled glucose and initial high reading of 300, most likely was stress induced as he has not needed SSI thus far   B12 deficiency  P:  B12 supplementation initiated 2/1  Protein calorie malnutrition P:  Extubating today, maximize diet when able after SLP  Best practice:  Diet: NPO, advance per SLP Pain/Anxiety/Delirium protocol (if indicated): d/c VAP protocol (if indicated): n/a DVT prophylaxis: SCDs/heparin GI prophylaxis: PPI  Glucose control: monitor Mobility: bed Code Status: full Family Communication: will update son, Seth Boyd  Disposition: ICU  Labs   CBC: Recent Labs  Lab 2019-07-05 1127 2019-07-05 1130 2019-07-05 1610 2019-07-05 1623 Jul 05, 2019 2050 07/05/2019 2245 07/01/19 0511  WBC 12.2*  --  12.7*  --   --   --  9.2  NEUTROABS 10.8*  --   --   --   --   --   --   HGB 13.2   < > 13.5 13.3 13.6 12.6* 11.7*  HCT 39.7   < > 39.8 39.0 40.0 37.0* 35.8*  MCV 88.8  --  87.1  --   --   --  89.5  PLT 258  --  269  --   --   --  183   < > = values in this interval not displayed.    Basic Metabolic Panel: Recent Labs  Lab 07-05-19 1127 2019-07-05 1127 2019/07/05 1130 05-Jul-2019 1130 07-05-2019 1610 July 05, 2019 1623 Jul 05, 2019 2059 Jul 05, 2019 2244 07/05/19 2245 06/30/19 0652 06/30/19 1614 07/01/19 0511  NA 135   < > 135  --   --    < > 138 138 138 137  --  137  K 3.8   < > 3.8  --   --    < > 3.1* 2.7* 2.6* 3.2*  --  3.3*  CL 95*   < > 96*  --   --   --  98 103  --  102  --  102  CO2 25  --   --   --   --   --  25 24  --  23  --  22  GLUCOSE 227*   < > 225*  --   --   --  157* 144*  --  136*  --  112*  BUN 14   < > 16  --   --   --  16 12  --  15  --  14  CREATININE 0.78   < > 0.50*   < > 0.65  --  0.67 0.65  --  0.80  --  0.66  CALCIUM 9.5  --   --   --   --   --  9.4 8.9   --  8.8*  --  8.7*  MG  --   --   --   --  1.5*  --   --   --   --  1.7 2.0 1.9  PHOS  --   --   --   --  2.6  --   --   --   --   --  2.6 2.3*   < > = values in this interval not displayed.   GFR: Estimated Creatinine Clearance: 62.9 mL/min (by C-G formula based on SCr of 0.66 mg/dL). Recent Labs  Lab 07/26/19 1127 July 26, 2019 1405 07/26/2019 1610 06/30/19 1000 07/01/19 0511  PROCALCITON  --   --  0.14 0.25  --   WBC 12.2*  --  12.7*  --  9.2  LATICACIDVEN  --  1.9  --   --   --     Liver Function Tests: Recent Labs  Lab 2019-07-26 1127 26-Jul-2019 2244 07/01/19 0511  AST 75* 79*  --   ALT 43 42  --   ALKPHOS 100 86  --   BILITOT 0.6 1.2  --   PROT 8.0 6.5  --   ALBUMIN 4.2 3.4* 2.7*   No results for input(s): LIPASE, AMYLASE in the last 168 hours. Recent Labs  Lab 07-26-2019 1405  AMMONIA 11    ABG    Component Value Date/Time   PHART 7.524 (H) 07-26-19 2245   PCO2ART 30.2 (L) 2019/07/26 2245   PO2ART 145.0 (H) 07-26-19 2245   HCO3 25.0 07/26/19 2245   TCO2 26 2019-07-26 2245   O2SAT 100.0 July 26, 2019 2245     Coagulation Profile: Recent Labs  Lab 07-26-2019 1127  INR 1.2    Cardiac Enzymes: Recent Labs  Lab 07/26/2019 1610 06/30/19 0342 07/01/19 0511  CKTOTAL 3,313* 1,448* 367  CKMB 109.1*  --   --     HbA1C: No results found for: HGBA1C  CBG: Recent Labs  Lab 07-26-19 1124 07-26-19 2008  GLUCAP 220* 147*    CCT  40 mins   Posey Boyer, MSN, AGACNP-BC [facility] 07/01/2019, 8:46 AM

## 2019-07-01 NOTE — Progress Notes (Signed)
Placed on BiPAP at this time per NP order. Placed on 16/8 to target pt 8CC's. Pt tolerating well at this time RT to continue to monitor as needed

## 2019-07-01 NOTE — Progress Notes (Signed)
eLink Physician-Brief Progress Note Patient Name: Seth Boyd DOB: 11-19-1934 MRN: [id number]   Date of Service  07/01/2019  HPI/Events of Note  Hypoxia - Sats dropped into the 70's. Extubated today. Poor cough. Retained secretions?   eICU Interventions  Will order: 1. Bag Mask Ventilation --> Sats improved to 98%. 2. Will ask ground team to evaluate patient at bedside.  3. Portable CXR STAT.     Intervention Category Major Interventions: Hypoxemia - evaluation and management  Kenzlee Fishburn [place] 07/01/2019, 11:12 PM

## 2019-07-01 NOTE — Procedures (Addendum)
Patient Name:Seth Boyd GEE:[id number] Epilepsy Attending:Areeb Corron Annabelle Harman Referring Physician/Provider:David Katrinka Blazing, [place] Duration: 06/30/2019 1136 to 07/01/2019 0992  Patient history:84 y.o.malewith PMH DM who presented to ED with c/o aphasia and right side weakness. CTH was negative for hemorrhage. EEG to evaluate for seizure  Level of alertness:awake, asleep  AEDs during EEG study:LEV  Technical aspects: This EEG study was done with scalp electrodes positioned according to the 10-20 International system of electrode placement. Electrical activity was acquired at a sampling rate of 500Hz  and reviewed with a high frequency filter of 70Hz  and a low frequency filter of 1Hz . EEG data were recorded continuously and digitally stored.  DESCRIPTION: During awake state, no clear posterior dominant rhythm was seen.  Sleep was characterized by vertex waves, sleep spindles (13 to 15 Hz), maximal frontocentral.  EEG showed continuous generalized 3-6Hz  theta-delta slowing as well as intermittent rhythmic generalized 2 to 3 Hz delta slowing. Hyperventilation and photic stimulation were not performeddue to ams.  ABNORMALITY - Continuous slow, generalized -Intermittent rhythmic slow, generalized  IMPRESSION: This study issuggestive of moderate diffuse encephalopathy,non specific to etiology.No seizures or epileptiform discharges were seen throughout the recording.  Millie Forde 

## 2019-07-01 NOTE — Progress Notes (Addendum)
Subjective: Patient currently is extubated.  He is able to follow commands and tell me that he is in the hospital.  Objective: Current vital signs: BP (!) 150/64   Pulse 68   Temp 97.7 F (36.5 C) (Axillary)   Resp 13   Ht 5' 10"  (1.778 m)   Wt 62.5 kg   SpO2 99%   BMI 19.77 kg/m  Vital signs in last 24 hours: Temp:  [97.7 F (36.5 C)-98.9 F (37.2 C)] 97.7 F (36.5 C) (02/02 0400) Pulse Rate:  [60-99] 68 (02/02 0700) Resp:  [11-21] 13 (02/02 0700) BP: (124-184)/(52-84) 150/64 (02/02 0700) SpO2:  [90 %-100 %] 99 % (02/02 0700) FiO2 (%):  [40 %] 40 % (02/02 0700) Weight:  [62.5 kg] 62.5 kg (02/02 0500)  Intake/Output from previous day: 02/01 0701 - 02/02 0700 In: 3161.7 [I.V.:2811.6; IV Piggyback:350.1] Out: 1050 [Urine:1050] Intake/Output this shift: No intake/output data recorded. Nutritional status:  Diet Order            Diet NPO time specified  Diet effective now              Neurologic Exam: Mental Status: Alert, oriented. Speech fluent without evidence of aphasia.  Able to follow simple commands without difficulty. Cranial Nerves: II:  Visual fields grossly normal,  III,IV, VI: ptosis not present, extra-ocular motions intact bilaterally pupils equal, round, reactive to light and accommodation V,VII: smile symmetric,  VIII: hearing decreased bilaterally XII: midline tongue extension Motor: Moving all extremities antigravity Sensory: Pinprick and light touch intact throughout, bilaterally Deep Tendon Reflexes: Depressed symmetric throughout Plantars: Right: downgoing   Left: downgoing    Lab Results: Results for orders placed or performed during the hospital encounter of 06/19/2019 (from the past 48 hour(s))  CBG monitoring, ED     Status: Abnormal   Collection Time: 06/14/2019 11:24 AM  Result Value Ref Range   Glucose-Capillary 220 (H) 70 - 99 mg/dL  Protime-INR     Status: None   Collection Time: 06/26/2019 11:27 AM  Result Value Ref Range    Prothrombin Time 14.8 11.4 - 15.2 seconds   INR 1.2 0.8 - 1.2    Comment: (NOTE) INR goal varies based on device and disease states. Performed at [facility], [place] [address] [place], [place] [address]   APTT     Status: None   Collection Time: 06/02/2019 11:27 AM  Result Value Ref Range   aPTT 29 24 - 36 seconds    Comment: Performed at [facility] [address], [place], [place] [address]  CBC     Status: Abnormal   Collection Time: 06/16/2019 11:27 AM  Result Value Ref Range   WBC 12.2 (H) 4.0 - 10.5 K/uL   RBC 4.47 4.22 - 5.81 MIL/uL   Hemoglobin 13.2 13.0 - 17.0 g/dL   HCT 39.7 39.0 - 52.0 %   MCV 88.8 80.0 - 100.0 fL   MCH 29.5 26.0 - 34.0 pg   MCHC 33.2 30.0 - 36.0 g/dL   RDW 12.1 11.5 - 15.5 %   Platelets 258 150 - 400 K/uL   nRBC 0.0 0.0 - 0.2 %    Comment: Performed at [facility], [place] [address] [place],  [address]  Differential     Status: Abnormal   Collection Time: 06/14/2019 11:27 AM  Result Value Ref Range   Neutrophils Relative % 89 %   Neutro Abs 10.8 (H) 1.7 - 7.7 K/uL   Lymphocytes Relative  2 %   Lymphs Abs 0.3 (L) 0.7 - 4.0 K/uL   Monocytes Relative 9 %   Monocytes Absolute 1.1 (H) 0.1 - 1.0 K/uL   Eosinophils Relative 0 %   Eosinophils Absolute 0.0 0.0 - 0.5 K/uL   Basophils Relative 0 %   Basophils Absolute 0.0 0.0 - 0.1 K/uL   Immature Granulocytes 0 %   Abs Immature Granulocytes 0.05 0.00 - 0.07 K/uL    Comment: Performed at [facility] [address], [place], [place] [address]  Comprehensive metabolic panel     Status: Abnormal   Collection Time: 06/03/2019 11:27 AM  Result Value Ref Range   Sodium 135 135 - 145 mmol/L   Potassium 3.8 3.5 - 5.1 mmol/L   Chloride 95 (L) 98 - 111 mmol/L   CO2 25 22 - 32 mmol/L   Glucose, Bld 227 (H) 70 - 99 mg/dL   BUN 14 8 - 23 mg/dL   Creatinine, Ser 0.78 0.61 - 1.24 mg/dL   Calcium 9.5 8.9 - 10.3 mg/dL   Total Protein 8.0 6.5 - 8.1 g/dL   Albumin 4.2 3.5 - 5.0 g/dL   AST  75 (H) 15 - 41 U/L   ALT 43 0 - 44 U/L   Alkaline Phosphatase 100 38 - 126 U/L   Total Bilirubin 0.6 0.3 - 1.2 mg/dL   GFR calc non Af Amer >60 >60 mL/min   GFR calc Af Amer >60 >60 mL/min   Anion gap 15 5 - 15    Comment: Performed at [facility], [place] [address], [place],  [address]  I-stat chem 8, ED     Status: Abnormal   Collection Time: 06/07/2019 11:30 AM  Result Value Ref Range   Sodium 135 135 - 145 mmol/L   Potassium 3.8 3.5 - 5.1 mmol/L   Chloride 96 (L) 98 - 111 mmol/L   BUN 16 8 - 23 mg/dL   Creatinine, Ser 0.50 (L) 0.61 - 1.24 mg/dL   Glucose, Bld 225 (H) 70 - 99 mg/dL   Calcium, Ion 1.07 (L) 1.15 - 1.40 mmol/L   TCO2 28 22 - 32 mmol/L   Hemoglobin 13.9 13.0 - 17.0 g/dL   HCT 41.0 39.0 - 52.0 %  Respiratory Panel by RT PCR (Flu A&B, Covid) - Nasopharyngeal Swab     Status: None   Collection Time: 06/26/2019 11:54 AM   Specimen: Nasopharyngeal Swab  Result Value Ref Range   SARS Coronavirus 2 by RT PCR NEGATIVE NEGATIVE    Comment: (NOTE) SARS-CoV-2 target nucleic acids are NOT DETECTED. The SARS-CoV-2 RNA is generally detectable in upper respiratoy specimens during the acute phase of infection. The lowest concentration of SARS-CoV-2 viral copies this assay can detect is 131 copies/mL. A negative result does not preclude SARS-Cov-2 infection and should not be used as the sole basis for treatment or other patient management decisions. A negative result may occur with  improper specimen collection/handling, submission of specimen other than nasopharyngeal swab, presence of viral mutation(s) within the areas targeted by this assay, and inadequate number of viral copies (<131 copies/mL). A negative result must be combined with clinical observations, patient history, and epidemiological information. The expected result is Negative. Fact Sheet for Patients:  PinkCheek.be Fact Sheet for Healthcare Providers:   GravelBags.it This test is not yet ap proved or cleared by the [place] FDA and  has been authorized for detection and/or diagnosis of SARS-CoV-2 by FDA under an Emergency Use Authorization (  EUA). This EUA will remain  in effect (meaning this test can be used) for the duration of the COVID-19 declaration under Section 564(b)(1) of the Act, 21 U.S.C. section 360bbb-3(b)(1), unless the authorization is terminated or revoked sooner.    Influenza A by PCR NEGATIVE NEGATIVE   Influenza B by PCR NEGATIVE NEGATIVE    Comment: (NOTE) The Xpert Xpress SARS-CoV-2/FLU/RSV assay is intended as an aid in  the diagnosis of influenza from Nasopharyngeal swab specimens and  should not be used as a sole basis for treatment. Nasal washings and  aspirates are unacceptable for Xpert Xpress SARS-CoV-2/FLU/RSV  testing. Fact Sheet for Patients: PinkCheek.be Fact Sheet for Healthcare Providers: GravelBags.it This test is not yet approved or cleared by the [place] FDA and  has been authorized for detection and/or diagnosis of SARS-CoV-2 by  FDA under an Emergency Use Authorization (EUA). This EUA will remain  in effect (meaning this test can be used) for the duration of the  Covid-19 declaration under Section 564(b)(1) of the Act, 21  U.S.C. section 360bbb-3(b)(1), unless the authorization is  terminated or revoked. Performed at [facility], [place] [address] [place], [place] [address]   Urinalysis, Routine w reflex microscopic     Status: Abnormal   Collection Time: 06/18/2019 12:25 PM  Result Value Ref Range   Color, Urine YELLOW YELLOW   APPearance CLEAR CLEAR   Specific Gravity, Urine 1.026 1.005 - 1.030   pH 6.0 5.0 - 8.0   Glucose, UA >=500 (A) NEGATIVE mg/dL   Hgb urine dipstick LARGE (A) NEGATIVE   Bilirubin Urine NEGATIVE NEGATIVE   Ketones, ur 20 (A) NEGATIVE mg/dL   Protein, ur 100 (A)  NEGATIVE mg/dL   Nitrite NEGATIVE NEGATIVE   Leukocytes,Ua NEGATIVE NEGATIVE   RBC / HPF 0-5 0 - 5 RBC/hpf   WBC, UA 0-5 0 - 5 WBC/hpf   Bacteria, UA NONE SEEN NONE SEEN   Mucus PRESENT     Comment: Performed at [facility] [address], [place], [place] [address]  Rapid urine drug screen (hospital performed)     Status: None   Collection Time: 06/04/2019 12:25 PM  Result Value Ref Range   Opiates NONE DETECTED NONE DETECTED   Cocaine NONE DETECTED NONE DETECTED   Benzodiazepines NONE DETECTED NONE DETECTED   Amphetamines NONE DETECTED NONE DETECTED   Tetrahydrocannabinol NONE DETECTED NONE DETECTED   Barbiturates NONE DETECTED NONE DETECTED    Comment: (NOTE) DRUG SCREEN FOR MEDICAL PURPOSES ONLY.  IF CONFIRMATION IS NEEDED FOR ANY PURPOSE, NOTIFY LAB WITHIN 5 DAYS. LOWEST DETECTABLE LIMITS FOR URINE DRUG SCREEN Drug Class                     Cutoff (ng/mL) Amphetamine and metabolites    1000 Barbiturate and metabolites    200 Benzodiazepine                 295 Tricyclics and metabolites     300 Opiates and metabolites        300 Cocaine and metabolites        300 THC                            50 Performed at [facility], [place] [address], [place], [place] [address]   Lactic acid, plasma     Status: None   Collection Time: 06/04/2019  2:05 PM  Result  Value Ref Range   Lactic Acid, Venous 1.9 0.5 - 1.9 mmol/L    Comment: Performed at [facility] [address], [place], [place] [address]  Ammonia     Status: None   Collection Time: 06/12/2019  2:05 PM  Result Value Ref Range   Ammonia 11 9 - 35 umol/L    Comment: Performed at [facility], [place] [address] [place], [place] [address]  CBC     Status: Abnormal   Collection Time: 06/21/2019  4:10 PM  Result Value Ref Range   WBC 12.7 (H) 4.0 - 10.5 K/uL   RBC 4.57 4.22 - 5.81 MIL/uL   Hemoglobin 13.5 13.0 - 17.0 g/dL   HCT 39.8 39.0 - 52.0 %   MCV 87.1 80.0 - 100.0 fL   MCH 29.5 26.0 - 34.0 pg    MCHC 33.9 30.0 - 36.0 g/dL   RDW 12.1 11.5 - 15.5 %   Platelets 269 150 - 400 K/uL   nRBC 0.0 0.0 - 0.2 %    Comment: Performed at [facility], [place] [address], [place], [place] [address]  Creatinine, serum     Status: None   Collection Time: 06/11/2019  4:10 PM  Result Value Ref Range   Creatinine, Ser 0.65 0.61 - 1.24 mg/dL   GFR calc non Af Amer >60 >60 mL/min   GFR calc Af Amer >60 >60 mL/min    Comment: Performed at [facility] [address] [place], [place] [address]  Magnesium     Status: Abnormal   Collection Time: 06/28/2019  4:10 PM  Result Value Ref Range   Magnesium 1.5 (L) 1.7 - 2.4 mg/dL    Comment: Performed at [facility] [address] [place], [place] [address]  Phosphorus     Status: None   Collection Time: 06/17/2019  4:10 PM  Result Value Ref Range   Phosphorus 2.6 2.5 - 4.6 mg/dL    Comment: Performed at [facility] [address] [place], [place] [address]  Procalcitonin     Status: None   Collection Time: 06/01/2019  4:10 PM  Result Value Ref Range   Procalcitonin 0.14 ng/mL    Comment:        Interpretation: PCT (Procalcitonin) <= 0.5 ng/mL: Systemic infection (sepsis) is not likely. Local bacterial infection is possible. (NOTE)       Sepsis PCT Algorithm           Lower Respiratory Tract                                      Infection PCT Algorithm    ----------------------------     ----------------------------         PCT < 0.25 ng/mL                PCT < 0.10 ng/mL         Strongly encourage             Strongly discourage   discontinuation of antibiotics    initiation of antibiotics    ----------------------------     -----------------------------       PCT 0.25 - 0.50 ng/mL            PCT 0.10 - 0.25 ng/mL               OR       >  80% decrease in PCT            Discourage initiation of                                            antibiotics      Encourage discontinuation           of antibiotics     ----------------------------     -----------------------------         PCT >= 0.50 ng/mL              PCT 0.26 - 0.50 ng/mL               AND        <80% decrease in PCT             Encourage initiation of                                             antibiotics       Encourage continuation           of antibiotics    ----------------------------     -----------------------------        PCT >= 0.50 ng/mL                  PCT > 0.50 ng/mL               AND         increase in PCT                  Strongly encourage                                      initiation of antibiotics    Strongly encourage escalation           of antibiotics                                     -----------------------------                                           PCT <= 0.25 ng/mL                                                 OR                                        > 80% decrease in PCT                                     Discontinue / Do not initiate  antibiotics Performed at [facility], [place] [address], [place], [place] [address]   Brain natriuretic peptide     Status: Abnormal   Collection Time: 06/04/2019  4:10 PM  Result Value Ref Range   B Natriuretic Peptide 745.7 (H) 0.0 - 100.0 pg/mL    Comment: Performed at [facility] [address] [place], [place] [address]  CK total and CKMB (cardiac)not at [facility]     Status: Abnormal   Collection Time: 06/23/2019  4:10 PM  Result Value Ref Range   Total CK 3,313 (H) 49 - 397 U/L   CK, MB 109.1 (H) 0.5 - 5.0 ng/mL   Relative Index 3.3 (H) 0.0 - 2.5    Comment: Performed at [facility] [address] [place], [place] [address]  I-STAT 7, (LYTES, BLD GAS, ICA, H+H)     Status: Abnormal   Collection Time: 06/17/2019  4:23 PM  Result Value Ref Range   pH, Arterial 7.512 (H) 7.350 - 7.450   pCO2 arterial 33.6 32.0 - 48.0 mmHg   pO2, Arterial 70.0 (L) 83.0 - 108.0 mmHg   Bicarbonate 27.0 20.0 -  28.0 mmol/L   TCO2 28 22 - 32 mmol/L   O2 Saturation 95.0 %   Acid-Base Excess 4.0 (H) 0.0 - 2.0 mmol/L   Sodium 135 135 - 145 mmol/L   Potassium 3.0 (L) 3.5 - 5.1 mmol/L   Calcium, Ion 1.20 1.15 - 1.40 mmol/L   HCT 39.0 39.0 - 52.0 %   Hemoglobin 13.3 13.0 - 17.0 g/dL   Patient temperature 98.6 F    Collection site RADIAL, ALLEN'S TEST ACCEPTABLE    Drawn by RT    Sample type ARTERIAL   MRSA PCR Screening     Status: None   Collection Time: 06/14/2019  6:33 PM   Specimen: Nasopharyngeal  Result Value Ref Range   MRSA by PCR NEGATIVE NEGATIVE    Comment:        The GeneXpert MRSA Assay (FDA approved for NASAL specimens only), is one component of a comprehensive MRSA colonization surveillance program. It is not intended to diagnose MRSA infection nor to guide or monitor treatment for MRSA infections. Performed at [facility], [place] [address], [place], [place] [address]   Glucose, capillary     Status: Abnormal   Collection Time: 06/12/2019  8:08 PM  Result Value Ref Range   Glucose-Capillary 147 (H) 70 - 99 mg/dL  I-STAT 7, (LYTES, BLD GAS, ICA, H+H)     Status: Abnormal   Collection Time: 06/22/2019  8:50 PM  Result Value Ref Range   pH, Arterial 7.472 (H) 7.350 - 7.450   pCO2 arterial 36.9 32.0 - 48.0 mmHg   pO2, Arterial 75.0 (L) 83.0 - 108.0 mmHg   Bicarbonate 27.1 20.0 - 28.0 mmol/L   TCO2 28 22 - 32 mmol/L   O2 Saturation 96.0 %   Acid-Base Excess 3.0 (H) 0.0 - 2.0 mmol/L   Sodium 136 135 - 145 mmol/L   Potassium 3.0 (L) 3.5 - 5.1 mmol/L   Calcium, Ion 1.20 1.15 - 1.40 mmol/L   HCT 40.0 39.0 - 52.0 %   Hemoglobin 13.6 13.0 - 17.0 g/dL   Patient temperature 98.0 F    Collection site RADIAL, ALLEN'S TEST ACCEPTABLE    Drawn by RT    Sample type ARTERIAL   Basic metabolic panel     Status: Abnormal   Collection Time: 06/26/2019  8:59 PM  Result Value Ref Range   Sodium 138 135 - 145 mmol/L   Potassium 3.1 (L) 3.5 - 5.1 mmol/L   Chloride 98 98 - 111 mmol/L    CO2 25 22 - 32 mmol/L   Glucose, Bld 157 (H) 70 - 99 mg/dL   BUN 16 8 - 23 mg/dL   Creatinine, Ser 0.67 0.61 - 1.24 mg/dL   Calcium 9.4 8.9 - 10.3 mg/dL   GFR calc non Af Amer >60 >60 mL/min   GFR calc Af Amer >60 >60 mL/min   Anion gap 15 5 - 15    Comment: Performed at [facility] [address], [place], [place] [address]  Comprehensive metabolic panel     Status: Abnormal   Collection Time: 06/19/2019 10:44 PM  Result Value Ref Range   Sodium 138 135 - 145 mmol/L   Potassium 2.7 (LL) 3.5 - 5.1 mmol/L    Comment: CRITICAL RESULT CALLED TO, READ BACK BY AND VERIFIED WITH: CARMICHAEL J,RN 06/14/2019 2359 WAYK    Chloride 103 98 - 111 mmol/L   CO2 24 22 - 32 mmol/L   Glucose, Bld 144 (H) 70 - 99 mg/dL   BUN 12 8 - 23 mg/dL   Creatinine, Ser 0.65 0.61 - 1.24 mg/dL   Calcium 8.9 8.9 - 10.3 mg/dL   Total Protein 6.5 6.5 - 8.1 g/dL   Albumin 3.4 (L) 3.5 - 5.0 g/dL   AST 79 (H) 15 - 41 U/L   ALT 42 0 - 44 U/L   Alkaline Phosphatase 86 38 - 126 U/L   Total Bilirubin 1.2 0.3 - 1.2 mg/dL   GFR calc non Af Amer >60 >60 mL/min   GFR calc Af Amer >60 >60 mL/min   Anion gap 11 5 - 15    Comment: Performed at [facility], [place] [address] [place], [place] [address]  I-STAT 7, (LYTES, BLD GAS, ICA, H+H)     Status: Abnormal   Collection Time: 06/06/2019 10:45 PM  Result Value Ref Range   pH, Arterial 7.524 (H) 7.350 - 7.450   pCO2 arterial 30.2 (L) 32.0 - 48.0 mmHg   pO2, Arterial 145.0 (H) 83.0 - 108.0 mmHg   Bicarbonate 25.0 20.0 - 28.0 mmol/L   TCO2 26 22 - 32 mmol/L   O2 Saturation 100.0 %   Acid-Base Excess 3.0 (H) 0.0 - 2.0 mmol/L   Sodium 138 135 - 145 mmol/L   Potassium 2.6 (LL) 3.5 - 5.1 mmol/L   Calcium, Ion 1.16 1.15 - 1.40 mmol/L   HCT 37.0 (L) 39.0 - 52.0 %   Hemoglobin 12.6 (L) 13.0 - 17.0 g/dL   Patient temperature 98.0 F    Collection site RADIAL, ALLEN'S TEST ACCEPTABLE    Drawn by RT    Sample type ARTERIAL    Comment NOTIFIED PHYSICIAN   TSH      Status: None   Collection Time: 06/30/19  3:42 AM  Result Value Ref Range   TSH 2.606 0.350 - 4.500 uIU/mL    Comment: Performed by a 3rd Generation assay with a functional sensitivity of <=0.01 uIU/mL. Performed at [facility], [place] [address] [place], [place] [address]   Vitamin B12     Status: None   Collection Time: 06/30/19  3:42 AM  Result Value Ref Range   Vitamin B-12 285 180 - 914 pg/mL    Comment: (NOTE) This assay is not validated for testing neonatal or myeloproliferative syndrome specimens for Vitamin  B12 levels. Performed at [facility], [place] [address], [place], [place] [address]   Acetaminophen level     Status: Abnormal   Collection Time: 06/30/19  3:42 AM  Result Value Ref Range   Acetaminophen (Tylenol), Serum <10 (L) 10 - 30 ug/mL    Comment: (NOTE) Therapeutic concentrations vary significantly. A range of 10-30 ug/mL  may be an effective concentration for many patients. However, some  are best treated at concentrations outside of this range. Acetaminophen concentrations >150 ug/mL at 4 hours after ingestion  and >50 ug/mL at 12 hours after ingestion are often associated with  toxic reactions. Performed at [facility], [place] [address], [place], [place] [address]   Salicylate level     Status: Abnormal   Collection Time: 06/30/19  3:42 AM  Result Value Ref Range   Salicylate Lvl <1.0 (L) 7.0 - 30.0 mg/dL    Comment: Performed at [facility] [address] [place], [place] [address]  CK     Status: Abnormal   Collection Time: 06/30/19  3:42 AM  Result Value Ref Range   Total CK 1,448 (H) 49 - 397 U/L    Comment: Performed at [facility], [place] [address], [place], [place] [address]  Basic metabolic panel     Status: Abnormal   Collection Time: 06/30/19  6:52 AM  Result Value Ref Range   Sodium 137 135 - 145 mmol/L   Potassium 3.2 (L) 3.5 - 5.1 mmol/L   Chloride 102 98 - 111 mmol/L   CO2 23 22 - 32 mmol/L   Glucose, Bld  136 (H) 70 - 99 mg/dL   BUN 15 8 - 23 mg/dL   Creatinine, Ser 0.80 0.61 - 1.24 mg/dL   Calcium 8.8 (L) 8.9 - 10.3 mg/dL   GFR calc non Af Amer >60 >60 mL/min   GFR calc Af Amer >60 >60 mL/min   Anion gap 12 5 - 15    Comment: Performed at [facility] [address] [place], [place] [address]  Magnesium     Status: None   Collection Time: 06/30/19  6:52 AM  Result Value Ref Range   Magnesium 1.7 1.7 - 2.4 mg/dL    Comment: Performed at [facility] [address], [place], [place] [address]  Procalcitonin - Baseline     Status: None   Collection Time: 06/30/19 10:00 AM  Result Value Ref Range   Procalcitonin 0.25 ng/mL    Comment:        Interpretation: PCT (Procalcitonin) <= 0.5 ng/mL: Systemic infection (sepsis) is not likely. Local bacterial infection is possible. (NOTE)       Sepsis PCT Algorithm           Lower Respiratory Tract                                      Infection PCT Algorithm    ----------------------------     ----------------------------         PCT < 0.25 ng/mL                PCT < 0.10 ng/mL         Strongly encourage             Strongly discourage   discontinuation of antibiotics    initiation of antibiotics    ----------------------------     -----------------------------  PCT 0.25 - 0.50 ng/mL            PCT 0.10 - 0.25 ng/mL               OR       >80% decrease in PCT            Discourage initiation of                                            antibiotics      Encourage discontinuation           of antibiotics    ----------------------------     -----------------------------         PCT >= 0.50 ng/mL              PCT 0.26 - 0.50 ng/mL               AND        <80% decrease in PCT             Encourage initiation of                                             antibiotics       Encourage continuation           of antibiotics    ----------------------------     -----------------------------        PCT >= 0.50 ng/mL                   PCT > 0.50 ng/mL               AND         increase in PCT                  Strongly encourage                                      initiation of antibiotics    Strongly encourage escalation           of antibiotics                                     -----------------------------                                           PCT <= 0.25 ng/mL                                                 OR                                        > 80% decrease in PCT  Discontinue / Do not initiate                                             antibiotics Performed at [facility], [place] [address] [place], [place] [address]   Magnesium     Status: None   Collection Time: 06/30/19  4:14 PM  Result Value Ref Range   Magnesium 2.0 1.7 - 2.4 mg/dL    Comment: Performed at [facility] [address] [place], [place] [address]  Phosphorus     Status: None   Collection Time: 06/30/19  4:14 PM  Result Value Ref Range   Phosphorus 2.6 2.5 - 4.6 mg/dL    Comment: Performed at [facility] [address] [place], [place] [address]  CK     Status: None   Collection Time: 07/01/19  5:11 AM  Result Value Ref Range   Total CK 367 49 - 397 U/L    Comment: Performed at [facility], [place] [address], [place], [place] [address]  Magnesium     Status: None   Collection Time: 07/01/19  5:11 AM  Result Value Ref Range   Magnesium 1.9 1.7 - 2.4 mg/dL    Comment: Performed at [facility] [address], [place], [place] [address]  CBC     Status: Abnormal   Collection Time: 07/01/19  5:11 AM  Result Value Ref Range   WBC 9.2 4.0 - 10.5 K/uL   RBC 4.00 (L) 4.22 - 5.81 MIL/uL   Hemoglobin 11.7 (L) 13.0 - 17.0 g/dL   HCT 35.8 (L) 39.0 - 52.0 %   MCV 89.5 80.0 - 100.0 fL   MCH 29.3 26.0 - 34.0 pg   MCHC 32.7 30.0 - 36.0 g/dL   RDW 12.5 11.5 - 15.5 %   Platelets 183 150 - 400 K/uL   nRBC 0.0 0.0 - 0.2 %    Comment: Performed at [facility], [place] [address], [place], [place] [address]    Recent Results (from the past 240 hour(s))  Respiratory Panel by RT PCR (Flu A&B, Covid) - Nasopharyngeal Swab     Status: None   Collection Time: 06/07/2019 11:54 AM   Specimen: Nasopharyngeal Swab  Result Value Ref Range Status   SARS Coronavirus 2 by RT PCR NEGATIVE NEGATIVE Final    Comment: (NOTE) SARS-CoV-2 target nucleic acids are NOT DETECTED. The SARS-CoV-2 RNA is generally detectable in upper respiratoy specimens during the acute phase of infection. The lowest concentration of SARS-CoV-2 viral copies this assay can detect is 131 copies/mL. A negative result does not preclude SARS-Cov-2 infection and should not be used as the sole basis for treatment or other patient management decisions. A negative result may occur with  improper specimen collection/handling, submission of specimen other than nasopharyngeal swab, presence of viral mutation(s) within the areas targeted by this assay, and inadequate number of viral copies (<131 copies/mL). A negative result must be combined with clinical observations, patient history, and epidemiological information. The expected result is Negative. Fact Sheet for Patients:  PinkCheek.be Fact Sheet for Healthcare Providers:  GravelBags.it This test is not yet ap proved or cleared by the [place] FDA and  has been authorized for detection and/or diagnosis of SARS-CoV-2 by FDA under an Emergency Use Authorization (EUA). This EUA will remain  in effect (meaning this test can be used) for the duration of the COVID-19 declaration under Section 564(b)(1) of the Act, 21 U.S.C. section 360bbb-3(b)(1), unless the authorization is terminated or revoked sooner.    Influenza A by PCR NEGATIVE NEGATIVE Final   Influenza B by PCR NEGATIVE NEGATIVE Final    Comment: (NOTE) The Xpert Xpress SARS-CoV-2/FLU/RSV assay is intended as an aid in   the diagnosis of influenza from Nasopharyngeal swab specimens and  should not be used as a sole basis for treatment. Nasal washings and  aspirates are unacceptable for Xpert Xpress SARS-CoV-2/FLU/RSV  testing. Fact Sheet for Patients: PinkCheek.be Fact Sheet for Healthcare Providers: GravelBags.it This test is not yet approved or cleared by the [place] FDA and  has been authorized for detection and/or diagnosis of SARS-CoV-2 by  FDA under an Emergency Use Authorization (EUA). This EUA will remain  in effect (meaning this test can be used) for the duration of the  Covid-19 declaration under Section 564(b)(1) of the Act, 21  U.S.C. section 360bbb-3(b)(1), unless the authorization is  terminated or revoked. Performed at [facility], [place] [address], [place], [place] [address]   MRSA PCR Screening     Status: None   Collection Time: 06/01/2019  6:33 PM   Specimen: Nasopharyngeal  Result Value Ref Range Status   MRSA by PCR NEGATIVE NEGATIVE Final    Comment:        The GeneXpert MRSA Assay (FDA approved for NASAL specimens only), is one component of a comprehensive MRSA colonization surveillance program. It is not intended to diagnose MRSA infection nor to guide or monitor treatment for MRSA infections. Performed at [facility], [place] [address] [place], [place] [address]     Lipid Panel No results for input(s): CHOL, TRIG, HDL, CHOLHDL, VLDL, LDLCALC in the last 72 hours.  Studies/Results:   EEG Result Date: 06/30/2019  IMPRESSION: This study is suggestive of moderate diffuse encephalopathy,,non specific to etiology. No seizures or epileptiform discharges were seen throughout the recording. EEG appears simi;lar to previous study performed on 06/08/2019 Lora Havens   EEG adult Result Date: 05/31/2019  IMPRESSION: This study is suggestive of moderate diffuse encephalopathy,,non specific to etiology.  No seizures or epileptiform discharges were seen throughout the recording. Lora Havens   CT HEAD CODE STROKE WO CONTRAST Result Date: 06/03/2019 CLINICAL DATA:  Code stroke.  Right leg weakness.  Aphasia. EXAM: CT HEAD WITHOUT CONTRAST TECHNIQUE: Contiguous axial images were obtained from the base of the skull through the vertex without intravenous contrast. COMPARISON:  01/26/2018 FINDINGS: Brain: Generalized atrophy. Chronic small-vessel ischemic changes affecting the cerebral hemispheric white matter. No sign of acute infarction, mass lesion, hemorrhage, hydrocephalus or extra-axial collection. Vascular: There is atherosclerotic calcification of the major vessels at the base of the brain. Skull: Negative Sinuses/Orbits: Clear/normal Other: None ASPECTS ([place] Stroke Program Early CT Score) - Ganglionic level infarction (caudate, lentiform nuclei, internal capsule, insula, M1-M3 cortex): 7 - Supraganglionic infarction (M4-M6 cortex): 3 Total score (0-10 with 10 being normal): 10 IMPRESSION: 1. No acute finding. Atrophy and chronic small-vessel ischemic changes. 2. ASPECTS is 10 3. These results were communicated to Dr. Rory Percy at 11:36 amon 01/18/2021by text page via the [facility] messaging system. Electronically Signed   By: Nelson Chimes M.D.   On: 06/14/2019 11:37    Medications:  Scheduled: . chlorhexidine gluconate (MEDLINE KIT)  15 mL Mouth Rinse BID  . Chlorhexidine Gluconate Cloth  6 each Topical Q0600  .  heparin  5,000 Units Subcutaneous Q8H  . mouth rinse  15 mL Mouth Rinse 10 times per day  . pantoprazole (PROTONIX) IV  40 mg Intravenous QHS  . vitamin B-12  2,000 mcg Oral Daily   Continuous: . feeding supplement (VITAL AF 1.2 CAL)    . lactated ringers 125 mL/hr at 07/01/19 0700  . levETIRAcetam Stopped (06/30/19 1840)    EEG, 2/1: Findings: No clear posterior dominant rhythm was seen. EEG showed continuous generalized 3-6Hz  theta-delta slowing. Impression:This study  issuggestive of moderate diffuse encephalopathy, non specific to etiology.No seizures or epileptiform discharges were seen throughout the recording. EEG appears similar to previous study performed on 06/05/2019   Assessment: 85 year old male who initially presented as a code stroke. Was encephalopathic with left gaze preference and bilateral lower extremity weakness. Had questionable focal findings on initial exam. No acute abnormality was seen on MRI brain and no LVO was seen on CTA.  1. Exam today reveals an extubated patient who is able to follow simple commands, he is aware that he is in the hospital and [place].  No clinical seizures noted  2. Spot EEGs on 1/31 and 2/1 were most consistent with a moderate diffuse encephalopathy. No electrographic seizures noted.  3. MRI brain did not reveal findings to explain the patient's encephalopathy, other than moderate cerebral atrophy with chronic microvascular ischemic disease, which could predispose to an encephalopathic state due to toxic/metabolic/infectious insult.   4. B12 level is low at 285 and would like to see up in the 600s 5. Elevated CK consistent with mild rhabdomyolysis. 6. Tox screen was negative. TSH normal. LFTs unremarkable. Ionized Ca normal. Mg normal. BUN/Cr normal. Ammonia normal.  7. The most likely etiology for the patient's encephalopathy is felt to be an unwitnessed seizure at home followed by postictal state. Recurrent subclinical seizures possibly missed by the spot EEGs was also on the DDx given waxing/waning mental status on exam on 2/1. However, LTM EEG showed only slowing, without electrographic seizures or epileptiform discharges.   Recommendations: -- LTM EEG is being discontinued.  -- Continue Keppra 500 mg IV BID. Switch to PO (same dosage) when able. He should be discharged on this medication.  -- Will need outpatient Neurology follow up -- Limit sedation -- High dose vitamin B12 supplementation has been  ordered. - At this time neurology will sign off.  Please call with any questions if needed.    LOS: 2 days   @Electronically  signed: Dr. Kerney Elbe 07/01/2019  8:01 AM

## 2019-07-01 NOTE — Evaluation (Signed)
Physical Therapy Evaluation Patient Details Name: Seth Boyd MRN: [id number] DOB: 31-Jul-1934 Today's Date: 07/01/2019   History of Present Illness  pt is an 85 y/o male with pmh significant for DM, HTN, admitted after found down on the floor by his son, making snoring sounds, eyes open and unable to talk.  At hospital there was some concern of pt's ability to protect his airway.  Imaging showing no acute neurological abnormality.  Clinical Impression  Pt admitted with/for being found on the floor with low responsiveness as described above.  Pt is processing slowly and needing moderate assist of 1-2 persons for mobility/safety.  Pt currently limited functionally due to the problems listed. ( See problems list.)   Pt will benefit from PT to maximize function and safety in order to get ready for next venue listed below.     Follow Up Recommendations CIR;Supervision/Assistance - 24 hour(or SNF if no ability to have 24 * assist)    Equipment Recommendations  Other (comment)(TBA)    Recommendations for Other Services Rehab consult     Precautions / Restrictions Precautions Precautions: Fall      Mobility  Bed Mobility Overal bed mobility: Needs Assistance Bed Mobility: Supine to Sit     Supine to sit: Mod assist     General bed mobility comments: cues for sequencing, initiation and follow through  Transfers Overall transfer level: Needs assistance Equipment used: 2 person hand held assist Transfers: Sit to/from Stand Sit to Stand: Mod assist;+2 physical assistance;+2 safety/equipment         General transfer comment: cues/assist for posture, w/shift, truncal support while pt took tentative pivotal steps to chair.  Ambulation/Gait             General Gait Details: unsteady pivotal steps only  Information systems manager [place] (Stroke Patients Only)       Balance Overall balance assessment: Needs assistance Sitting-balance  support: No upper extremity supported;Single extremity supported Sitting balance-Leahy Scale: Fair     Standing balance support: Bilateral upper extremity supported Standing balance-Leahy Scale: Poor                               Pertinent Vitals/Pain Pain Assessment: Faces Faces Pain Scale: Hurts little more Pain Descriptors / Indicators: Aching;Grimacing;Guarding Pain Intervention(s): Monitored during session    Home Living Family/patient expects to be discharged to:: Private residence Living Arrangements: Alone Available Help at Discharge: Family;Available PRN/intermittently Type of Home: House Home Access: Stairs to enter Entrance Stairs-Rails: Right;Left Entrance Stairs-Number of Steps: 4 Home Layout: Two level;Able to live on main level with bedroom/bathroom;Other (Comment);Laundry or work area in [employer]: (TBA)      Prior Function Level of Independence: Independent               Higher education careers adviser        Extremity/Trunk Assessment   Upper Extremity Assessment Upper Extremity Assessment: Defer to OT evaluation    Lower Extremity Assessment Lower Extremity Assessment: Generalized weakness(stiff, bradykinetic)       Communication   Communication: Other (comment);HOH(pt unable to get up secretions.)  Cognition Arousal/Alertness: Awake/alert Behavior During Therapy: Flat affect Overall Cognitive Status: Impaired/Different from baseline Area of Impairment: Attention;Memory;Following commands;Safety/judgement;Awareness;Problem solving;Orientation                 Orientation Level: Situation;Time Current Attention Level: Focused;Sustained Memory: Decreased  recall of precautions;Decreased short-term memory Following Commands: Follows one step commands with increased time Safety/Judgement: Decreased awareness of safety;Decreased awareness of deficits Awareness: Intellectual Problem Solving: Slow processing;Decreased  initiation;Difficulty sequencing;Requires verbal cues        General Comments      Exercises Other Exercises Other Exercises: warm up ROM prior to mobility   Assessment/Plan    PT Assessment Patient needs continued PT services  PT Problem List Decreased strength;Decreased activity tolerance;Decreased balance;Decreased mobility;Decreased coordination;Decreased safety awareness       PT Treatment Interventions Gait training;Functional mobility training;Therapeutic activities;Balance training;Patient/family education;DME instruction    PT Goals (Current goals can be found in the Care Plan section)  Acute Rehab PT Goals PT Goal Formulation: Patient unable to participate in goal setting Time For Goal Achievement: 07/15/19 Potential to Achieve Goals: Fair    Frequency Min 3X/week   Barriers to discharge        Co-evaluation               AM-PAC PT "6 Clicks" Mobility  Outcome Measure Help needed turning from your back to your side while in a flat bed without using bedrails?: A Lot Help needed moving from lying on your back to sitting on the side of a flat bed without using bedrails?: A Lot Help needed moving to and from a bed to a chair (including a wheelchair)?: A Lot Help needed standing up from a chair using your arms (e.g., wheelchair or bedside chair)?: A Lot Help needed to walk in hospital room?: A Lot Help needed climbing 3-5 steps with a railing? : Total 6 Click Score: 11    End of Session   Activity Tolerance: Patient tolerated treatment well;Patient limited by fatigue Patient left: in chair;with call bell/phone within reach;with chair alarm set Nurse Communication: Mobility status PT Visit Diagnosis: Unsteadiness on feet (R26.81);Muscle weakness (generalized) (M62.81);Difficulty in walking, not elsewhere classified (R26.2)    Time: 6213-0865 PT Time Calculation (min) (ACUTE ONLY): 28 min   Charges:   PT Evaluation $PT Eval Moderate Complexity: 1  Mod PT Treatments $Therapeutic Activity: 8-22 mins        07/01/2019  Ginger Carne., PT Acute Rehabilitation Services [phone]  (pager) [phone]  (office)  Tessie Fass Deidre Carino 07/01/2019, 1:05 PM

## 2019-07-02 LAB — BASIC METABOLIC PANEL
Anion gap: 15 (ref 5–15)
BUN: 17 mg/dL (ref 8–23)
CO2: 28 mmol/L (ref 22–32)
Calcium: 9 mg/dL (ref 8.9–10.3)
Chloride: 100 mmol/L (ref 98–111)
Creatinine, Ser: 0.9 mg/dL (ref 0.61–1.24)
GFR calc Af Amer: >60 mL/min (ref >60)
GFR calc non Af Amer: >60 mL/min (ref >60)
Glucose, Bld: 158 mg/dL — ABNORMAL HIGH (ref 70–99)
Potassium: 2.9 mmol/L — ABNORMAL LOW (ref 3.5–5.1)
Sodium: 143 mmol/L (ref 135–145)

## 2019-07-02 LAB — PHOSPHORUS: Phosphorus: 2.7 mg/dL (ref 2.5–4.6)

## 2019-07-02 LAB — HEMOGLOBIN A1C
Hgb A1c MFr Bld: 6.5 % — ABNORMAL HIGH (ref 4.8–5.6)
Mean Plasma Glucose: 139.85 mg/dL

## 2019-07-02 LAB — MAGNESIUM: Magnesium: 2 mg/dL (ref 1.7–2.4)

## 2019-07-02 MED ORDER — POTASSIUM CHLORIDE 10 MEQ/100ML IV SOLN
10.0000 meq | INTRAVENOUS | Status: AC
  Administered 2019-07-02 (×6): 10 meq via INTRAVENOUS
  Filled 2019-07-02 (×7): qty 100

## 2019-07-02 MED ORDER — DEXMEDETOMIDINE HCL IN NACL 400 MCG/100ML IV SOLN
0.2000 ug/kg/h | INTRAVENOUS | Status: DC
  Administered 2019-07-02: 0.4 ug/kg/h via INTRAVENOUS
  Administered 2019-07-02: 0.2 ug/kg/h via INTRAVENOUS
  Administered 2019-07-03: 0.7 ug/kg/h via INTRAVENOUS
  Administered 2019-07-06 – 2019-07-07 (×3): 0.2 ug/kg/h via INTRAVENOUS
  Filled 2019-07-02 (×7): qty 100

## 2019-07-02 MED ORDER — LACTATED RINGERS IV SOLN: INTRAVENOUS | Status: DC

## 2019-07-02 MED ORDER — POTASSIUM PHOSPHATES 15 MMOLE/5ML IV SOLN: 20.0000 mmol | Freq: Once | INTRAVENOUS | Status: DC

## 2019-07-02 NOTE — Progress Notes (Signed)
Physical Therapy Treatment Patient Details Name: Seth Boyd MRN: [id number] DOB: 09/25/1934 Today's Date: 07/02/2019    History of Present Illness pt is an 85 y/o male with pmh significant for DM, HTN, admitted after found down on the floor by his son, making snoring sounds, eyes open and unable to talk.  At hospital there was some concern of pt's ability to protect his airway.  Imaging showing no acute neurological abnormality.    PT Comments    Pt more confused and fidgety today.  Emphasis continues on transitions, scooting, sit to stand, pre gait and transfers.    Follow Up Recommendations  CIR;Supervision/Assistance - 24 hour(or SNF if no ability to have 24 * assist)     Equipment Recommendations  Other (comment)(TBA)    Recommendations for Other Services Rehab consult     Precautions / Restrictions Precautions Precautions: Fall    Mobility  Bed Mobility Overal bed mobility: Needs Assistance Bed Mobility: Supine to Sit     Supine to sit: Mod assist     General bed mobility comments: cues for sequencing, initiation and follow through  Transfers Overall transfer level: Needs assistance Equipment used: Rolling walker (2 wheeled) Transfers: Sit to/from [facility] Sit to Stand: Mod assist;+2 physical assistance;+2 safety/equipment Stand pivot transfers: Mod assist;+2 physical assistance;+2 safety/equipment       General transfer comment: cues/assist for posture, w/shift, truncal support and assist directing the RW while pt took several unsteady tentative steps.   Ambulation/Gait             General Gait Details: unsteady pivotal steps only   Marine scientist [place] (Stroke Patients Only)       Balance Overall balance assessment: Needs assistance Sitting-balance support: No upper extremity supported;Single extremity supported Sitting balance-Leahy Scale: Fair     Standing balance  support: Bilateral upper extremity supported Standing balance-Leahy Scale: Poor                              Cognition Arousal/Alertness: Awake/alert Behavior During Therapy: Flat affect Overall Cognitive Status: Impaired/Different from baseline Area of Impairment: Attention;Memory;Following commands;Safety/judgement;Awareness;Problem solving;Orientation                 Orientation Level: Situation;Time Current Attention Level: Focused;Sustained Memory: Decreased recall of precautions;Decreased short-term memory Following Commands: Follows one step commands with increased time Safety/Judgement: Decreased awareness of safety;Decreased awareness of deficits Awareness: Intellectual Problem Solving: Slow processing;Decreased initiation;Difficulty sequencing;Requires verbal cues General Comments: pt more confused today      Exercises Other Exercises Other Exercises: warm up ROM prior to mobility    General Comments        Pertinent Vitals/Pain Pain Assessment: Faces Faces Pain Scale: Hurts a little bit Pain Location: knees Pain Descriptors / Indicators: Aching;Grimacing;Guarding Pain Intervention(s): Monitored during session    Home Living Family/patient expects to be discharged to:: Private residence Living Arrangements: Alone Available Help at Discharge: Family;Available PRN/intermittently Type of Home: House Home Access: Stairs to enter Entrance Stairs-Rails: Right;Left Home Layout: Two level;Able to live on main level with bedroom/bathroom;Other (Comment);Laundry or work area in [employer]: (TBA)      Prior Function Level of Independence: Independent          PT Goals (current goals can now be found in the care plan section) Acute Rehab PT Goals PT Goal Formulation: Patient unable  to participate in goal setting Time For Goal Achievement: 07/15/19 Potential to Achieve Goals: Fair    Frequency    Min 3X/week      PT Plan       Co-evaluation              AM-PAC PT "6 Clicks" Mobility   Outcome Measure  Help needed turning from your back to your side while in a flat bed without using bedrails?: A Lot Help needed moving from lying on your back to sitting on the side of a flat bed without using bedrails?: A Lot Help needed moving to and from a bed to a chair (including a wheelchair)?: A Lot Help needed standing up from a chair using your arms (e.g., wheelchair or bedside chair)?: A Lot Help needed to walk in hospital room?: A Lot Help needed climbing 3-5 steps with a railing? : Total 6 Click Score: 11    End of Session   Activity Tolerance: Patient tolerated treatment well;Patient limited by fatigue Patient left: in chair;with call bell/phone within reach;with chair alarm set Nurse Communication: Mobility status PT Visit Diagnosis: Unsteadiness on feet (R26.81);Muscle weakness (generalized) (M62.81);Difficulty in walking, not elsewhere classified (R26.2)     Time: 6333-5456 PT Time Calculation (min) (ACUTE ONLY): 30 min  Charges:  $Therapeutic Activity: 8-22 mins                     07/02/2019  Jacinto Halim., PT Acute Rehabilitation Services [phone]  (pager) [phone]  (office)   Seth Boyd 07/02/2019, 11:01 AM

## 2019-07-02 NOTE — Evaluation (Signed)
Occupational Therapy Evaluation Patient Details Name: Seth Boyd MRN: [id number] DOB: 09-Jun-1934 Today's Date: 07/02/2019    History of Present Illness pt is an 85 y/o male with pmh significant for DM, HTN, admitted after found down on the floor by his son, making snoring sounds, eyes open and unable to talk.  At hospital there was some concern of pt's ability to protect his airway.  Imaging showing no acute neurological abnormality.   Clinical Impression   Pt PTA: Pt living alone. Pt currently limited by [facility], inability to follow most commands, lack of awareness of deficit or safety and decreased ability to care for self. Pt unable to attend to tasks for long without redirection. pt following 10% of commands throughout session. Pt maxA overall for ADL due to poor cognitive follow through and poor attention. Pt modA +2 for mobility with RW taking a few steps from bed to recliner.  Pt's O2 on bipap; then RN put pt on 10L O2; pt stayed >98% with 3L O2 [place] today during and after exertion. Pt would benefit from continued OT skilled services for ADL, mobility and safety in CIR setting. OT following acutely.    Follow Up Recommendations  CIR    Equipment Recommendations  Other (comment)(to be determined)    Recommendations for Other Services       Precautions / Restrictions Precautions Precautions: Fall Restrictions Weight Bearing Restrictions: No      Mobility Bed Mobility Overal bed mobility: Needs Assistance Bed Mobility: Supine to Sit     Supine to sit: Mod assist     General bed mobility comments: cues for sequencing, initiation  Transfers Overall transfer level: Needs assistance Equipment used: Rolling walker (2 wheeled) Transfers: Sit to/from [facility] Sit to Stand: Mod assist;+2 physical assistance;+2 safety/equipment Stand pivot transfers: Mod assist;+2 physical assistance;+2 safety/equipment       General transfer comment: Pt taking very slow  steps; pt requiring cues to stand upright and to avoid premature sitting.    Balance Overall balance assessment: Needs assistance Sitting-balance support: No upper extremity supported;Single extremity supported Sitting balance-Leahy Scale: Fair     Standing balance support: Bilateral upper extremity supported Standing balance-Leahy Scale: Poor                             ADL either performed or assessed with clinical judgement   ADL Overall ADL's : Needs assistance/impaired Eating/Feeding: NPO   Grooming: Minimal assistance;Moderate assistance;Sitting Grooming Details (indicate cue type and reason): multimodal cues to perform task Upper Body Bathing: Moderate assistance;Sitting;Cueing for safety   Lower Body Bathing: Maximal assistance;+2 for physical assistance;Sitting/lateral leans;Sit to/from stand;Cueing for safety;Cueing for sequencing   Upper Body Dressing : Moderate assistance;Sitting;Cueing for safety   Lower Body Dressing: Maximal assistance;+2 for physical assistance;Sitting/lateral leans;Sit to/from stand;Cueing for safety   Toilet Transfer: Moderate assistance;+2 for physical assistance;Stand-pivot;BSC;RW   Toileting- Clothing Manipulation and Hygiene: Moderate assistance;+2 for physical assistance;+2 for safety/equipment;Sitting/lateral lean;Sit to/from stand;Cueing for safety       Functional mobility during ADLs: Moderate assistance;Cueing for safety;Rolling walker;+2 for physical assistance General ADL Comments: Pt limited by [facility], inability to follow most commands, lack of awareness of deficit or safety and decreased ability to care for self.     Vision   Vision Assessment?: Vision impaired- to be further tested in functional context Additional Comments: Able to track moving objects in visual fieldl     Perception     Praxis  Pertinent Vitals/Pain Pain Assessment: Faces Faces Pain Scale: Hurts a little bit Pain Location: knees Pain  Descriptors / Indicators: Aching;Grimacing;Guarding Pain Intervention(s): Monitored during session     Hand Dominance     Extremity/Trunk Assessment Upper Extremity Assessment Upper Extremity Assessment: Generalized weakness;RUE deficits/detail;LUE deficits/detail RUE Deficits / Details: 3+/5 mm grade RUE Coordination: decreased fine motor;decreased gross motor LUE Deficits / Details: 3+/5 mm grade LUE Coordination: decreased fine motor;decreased gross motor   Lower Extremity Assessment Lower Extremity Assessment: Generalized weakness   Cervical / Trunk Assessment Cervical / Trunk Assessment: Normal   Communication Communication Communication: HOH;Other (comment)(unable to cough up secretions)   Cognition Arousal/Alertness: Awake/alert Behavior During Therapy: Flat affect Overall Cognitive Status: Impaired/Different from baseline Area of Impairment: Attention;Memory;Following commands;Safety/judgement;Awareness;Problem solving;Orientation                 Orientation Level: Situation;Time Current Attention Level: Focused;Sustained Memory: Decreased recall of precautions;Decreased short-term memory Following Commands: Follows one step commands with increased time Safety/Judgement: Decreased awareness of safety;Decreased awareness of deficits Awareness: Intellectual Problem Solving: Slow processing;Decreased initiation;Difficulty sequencing;Requires verbal cues General Comments: Pt unable to attend to tasks for long without redirection. pt following 10% of commands throughout session.   General Comments  O2 on bipap; then RN put pt on 10L O2; pt stayed >98% with 3L O2 [place] today during and after exertion.    Exercises Exercises: Other exercises Other Exercises Other Exercises: AAROM shoulder through digits   Shoulder Instructions      Home Living Family/patient expects to be discharged to:: Private residence Living Arrangements: Alone Available Help at Discharge:  Family;Available PRN/intermittently Type of Home: House Home Access: Stairs to enter [employer] of Steps: 4 Entrance Stairs-Rails: Right;Left Home Layout: Two level;Able to live on main level with bedroom/bathroom;Other (Comment);Laundry or work area in basement     [employer]: Producer, television/film/video: Handicapped height     Home Equipment: Other (comment)          Prior Functioning/Environment Level of Independence: Independent                 OT Problem List: Decreased strength;Decreased activity tolerance;Impaired balance (sitting and/or standing);Decreased safety awareness;Decreased cognition;Decreased coordination;Impaired UE functional use;Pain      OT Treatment/Interventions: Self-care/ADL training;Neuromuscular education;Therapeutic exercise;Energy conservation;Therapeutic activities;Visual/perceptual remediation/compensation;Patient/family education;Balance training    OT Goals(Current goals can be found in the care plan section) Acute Rehab OT Goals Patient Stated Goal: none stated OT Goal Formulation: Patient unable to participate in goal setting Time For Goal Achievement: 07/16/19 Potential to Achieve Goals: Good ADL Goals Pt Will Perform Eating: (P) with supervision Pt Will Perform Grooming: (P) with min guard assist;standing Pt Will Perform Upper Body Dressing: (P) with supervision;sitting Pt Will Transfer to Toilet: (P) with min assist;ambulating;bedside commode Pt/caregiver will Perform Home Exercise Program: (P) Increased strength;Both right and left upper extremity;With Supervision Additional ADL Goal #1: (P) Pt will keep sustained attention x5 mins to complete ADl task with minimal multimodal cues.  OT Frequency: Min 2X/week   Barriers to D/C:            Co-evaluation              AM-PAC OT "6 Clicks" Daily Activity     Outcome Measure Help from another person eating meals?: Total Help from another person  taking care of personal grooming?: A Lot Help from another person toileting, which includes using toliet, bedpan, or urinal?: Total Help from another person bathing (including washing, rinsing,  drying)?: Total Help from another person to put on and taking off regular upper body clothing?: A Lot Help from another person to put on and taking off regular lower body clothing?: Total 6 Click Score: 8   End of Session Equipment Utilized During Treatment: Rolling walker Nurse Communication: Mobility status  Activity Tolerance: Patient tolerated treatment well Patient left: in chair;with call bell/phone within reach;with chair alarm set;Other (comment)(with SLP coming in room)  OT Visit Diagnosis: Muscle weakness (generalized) (M62.81);Cognitive communication deficit (R41.841);Unsteadiness on feet (R26.81) Symptoms and signs involving cognitive functions: Other cerebrovascular disease                Time: 1019-1052 OT Time Calculation (min): 33 min Charges:  OT General Charges $OT Visit: 1 Visit OT Evaluation $OT Eval Moderate Complexity: 1 Mod  Flora Lipps OTR/L Acute Rehabilitation Services Pager: [phone] Office: [phone]   Kayleena Eke C 07/02/2019, 3:05 PM

## 2019-07-02 NOTE — Progress Notes (Signed)
NAME:  Camran Keady, MRN:  [id number], DOB:  [birth year], LOS: 3 ADMISSION DATE:  06/17/2019, CONSULTATION DATE:  06/28/2019 REFERRING MD:  Lorin Mercy, CHIEF COMPLAINT:  Encephalopathy   Brief History   85 y.o. male, goes by Seth Boyd, with prior hx HTN/ DM presenting after being found on floor with acute encephalopathy.  Presented as code stroke.  Noted to have initial left gaze.  LSW 1/30 ~4 pm.  Rule out for stroke with negative CTH/ CTA.  Concern for seizure +/- toxic/ metabolic encephalopathy.    Past Medical History  HTN, DM  Significant Hospital Events   1/31 Admit 2/02 extubated 2/03 difficulty with agitation and airway protection, weak cough  Consults:  Neurology  Procedures:  1/31 ETT >> 2/02  Significant Diagnostic Tests:  1/31 CT head/neck >> 30% ICA stenosis on the right, 20% on the left, 30-50% narrowing in both carotid siphon regions but no correctable proximal stenosis. 1/31 EEG >> moderate diffuse encephalopathy, no epileptiform activity 2/01 brain MRI >> Moderate cerebral atrophy with chronic microvascular ischemic disease, Soft tissue edema within the right occipital/suboccipital scalp. 2/01 LTM  >> moderate diffuse encephalopathy  Micro Data:  1/31 SARS 2/ flu A/B >> neg 1/31 MRSA PCR >> negative  Antimicrobials:  Unasyn 2/02 >>   Interim history/subjective:  Needed Bipap overnight.  Increased O2 needs.  More agitated.  Objective   Blood pressure 130/67, pulse 81, temperature 97.7 F (36.5 C), temperature source Axillary, resp. rate (!) 23, height 5\' 10"  (1.778 m), weight 62.5 kg, SpO2 93 %.    Vent Mode: PRVC FiO2 (%):  [40 %-60 %] 50 % Set Rate:  [12 bmp] 12 bmp Vt Set:  [560 mL] 560 mL PEEP:  [5 cmH20] 5 cmH20 Plateau Pressure:  [15 cmH20] 15 cmH20   Intake/Output Summary (Last 24 hours) at 07/02/2019 0802 Last data filed at 07/02/2019 0600 Gross per 24 hour  Intake 1260.97 ml  Output 4350 ml  Net -3089.03 ml   Filed Weights   06/28/2019 1815 07/01/19  0500 07/02/19 0500  Weight: 62.6 kg 62.5 kg 62.5 kg   Examination:  General - agitated Eyes - pupils reactive ENT - gurgling, moaning Cardiac - regular, tachycardic Chest - b/l rhonchi Abdomen - soft, non tender, + bowel sounds Extremities - decreased muscle bulk Skin - no rashes Neuro - moves extremities, follows simple commands, trying to crawl out of bed  CXR (reviewed by me) - perihilar ASD  Resolved Hospital Problem list   Rhabdomyolysis  Assessment & Plan:   Acute hypoxic respiratory failure with compromised airway. - goal SpO2 > 92% - monitor need for reintubation - f/u CXR  Aspiration pneumonia. - day 2 of unasyn  Acute metabolic encephalopathy - cause unclear, but initially felt to be from unwitnessed seizure. - continue AEDs - add precedex 2/03 for goal RASS 0 to -1  Hypokalemia. Hypophosphatemia. - replace as needed  HTN. - prn labetalol IV  - resume lipitor, ASA, lisinopril, HCTZ when able to take pills  Dysphagia. Severe protein calorie malnutrition. - f/u with speech therapy - will need cortrak and then start tube feeds  DM type 2 poorly controlled. - SSI  B12 deficiency.  - B12 supplementation initiated 2/1  Hx of GERD. - continue protonix   Best practice:  Diet: NPO, advance per SLP DVT prophylaxis: SCDs/heparin GI prophylaxis: PPI  Glucose control: monitor Mobility: bed Code Status: full Family Communication: will update son, [name]  Disposition: ICU  Labs    CMP Latest Ref  Rng & Units 07/01/2019 06/30/2019 07/26/19  Glucose 70 - 99 mg/dL 818(E) 993(Z) -  BUN 8 - 23 mg/dL 14 15 -  Creatinine 1.69 - 1.24 mg/dL 6.78 9.38 -  Sodium 101 - 145 mmol/L 137 137 138  Potassium 3.5 - 5.1 mmol/L 3.3(L) 3.2(L) 2.6(LL)  Chloride 98 - 111 mmol/L 102 102 -  CO2 22 - 32 mmol/L 22 23 -  Calcium 8.9 - 10.3 mg/dL 7.5(Z) 0.2(H) -  Total Protein 6.5 - 8.1 g/dL - - -  Total Bilirubin 0.3 - 1.2 mg/dL - - -  Alkaline Phos 38 - 126 U/L - - -  AST 15  - 41 U/L - - -  ALT 0 - 44 U/L - - -    CBC Latest Ref Rng & Units 07/01/2019 07-26-2019 2019-07-26  WBC 4.0 - 10.5 K/uL 9.2 - -  Hemoglobin 13.0 - 17.0 g/dL 11.7(L) 12.6(L) 13.6  Hematocrit 39.0 - 52.0 % 35.8(L) 37.0(L) 40.0  Platelets 150 - 400 K/uL 183 - -    ABG    Component Value Date/Time   PHART 7.524 (H) Jul 26, 2019 2245   PCO2ART 30.2 (L) 2019/07/26 2245   PO2ART 145.0 (H) Jul 26, 2019 2245   HCO3 25.0 07-26-2019 2245   TCO2 26 July 26, 2019 2245   O2SAT 100.0 07-26-19 2245    CBG (last 3)  Recent Labs    2019-07-26 1124 07/26/2019 2008  GLUCAP 220* 147*    CC time 35 minutes  Coralyn Helling, MD [facility] Pulmonary/Critical Care 07/02/2019, 8:11 AM

## 2019-07-02 NOTE — Evaluation (Signed)
Clinical/Bedside Swallow Evaluation Patient Details  Name: Wolfe Camarena MRN: [id number] Date of Birth: 04/21/1935  Today's Date: 07/02/2019 Time: SLP Start Time (ACUTE ONLY): 1100 SLP Stop Time (ACUTE ONLY): 1125 SLP Time Calculation (min) (ACUTE ONLY): 25 min  Past Medical History:  Past Medical History:  Diagnosis Date  . DM (diabetes mellitus) ([place])   . Essential hypertension    Past Surgical History:  Past Surgical History:  Procedure Laterality Date  . c-spine     HPI:  85 y.o. male with PMH DM who presented to ED with c/o aphasia and right side weakness. CTH was negative for hemorrhage. Pt was intubated (1/31) and extubated (2/2). 2/03 difficulty with agitation and airway protection, weak cough.   Assessment / Plan / Recommendation Clinical Impression   Patient drowsy and sitting upright in chair upon arrival. He is very HOH, which made it difficult for pt to follow commands. SLP provided oral care, noting thick audible secretions in pt's throat. Trialed ice chips and thin liquids, with decreased AP transit, multiple swallows (at least 5-6/ trial), and immediate throat clearing following majority of trials. Significant increase in throat clearing with thin liquids and pt's vocal quality was noted to be wet after each trial. SLP using Max cues to elicit cough; however, he was unable to elicit strong or consistent cough. D/t significant s/sx of aspiration, recent extubation, and decreased secretion mangement, recommended continued NPO. Following oral care, nursing staff may provide oral swab with water to work on thinning secretions; no ice chips at this time.   SLP Visit Diagnosis: Dysphagia, oropharyngeal phase (R13.12)    Aspiration Risk  Moderate aspiration risk    Diet Recommendation NPO;Other (Comment)(oral swab with water following oral care)   Medication Administration: Via alternative means Postural Changes: Seated upright at 90 degrees    Other  Recommendations Oral  Care Recommendations: Oral care prior to ice chip/H20   Follow up Recommendations Other (comment)(tbd)      Frequency and Duration min 2x/week  2 weeks       Prognosis Prognosis for Safe Diet Advancement: Fair      Swallow Study   General Date of Onset: 06/04/2019 HPI: 85 y.o. male with PMH DM who presented to ED with c/o aphasia and right side weakness. CTH was negative for hemorrhage. EEG to evaluate for seizure Reported decreased ability to speak, eye gaze to right. No reported ho same stroke, or seizure.  Airway appears patent. Gaze to left and does not cross midline. Pt was intubated (1/31) and extubated (2/2). 2/03 difficulty with agitation and airway protection, weak cough. Type of Study: Bedside Swallow Evaluation Previous Swallow Assessment: none Diet Prior to this Study: NPO Temperature Spikes Noted: No Respiratory Status: Nasal cannula History of Recent Intubation: Yes Length of Intubations (days): 3 days Date extubated: 07/01/19 Behavior/Cognition: Lethargic/Drowsy;Requires cueing Oral Care Completed by SLP: Yes Oral Cavity - Dentition: Adequate natural dentition Vision: Functional for self-feeding Self-Feeding Abilities: Needs assist;Able to feed self Patient Positioning: Upright in chair Baseline Vocal Quality: Breathy Volitional Cough: Cognitively unable to elicit Volitional Swallow: Able to elicit    Oral/Motor/Sensory Function Overall Oral Motor/Sensory Function: Other (comment)(unable to complete - very HOH)   Ice Chips Ice chips: Impaired Presentation: Spoon Oral Phase Functional Implications: Prolonged oral transit Pharyngeal Phase Impairments: Multiple swallows;Throat Clearing - Immediate   Thin Liquid Thin Liquid: Impaired Presentation: Spoon Oral Phase Functional Implications: Prolonged oral transit Pharyngeal  Phase Impairments: Multiple swallows;Throat Clearing - Immediate    Nectar Thick Nectar  Thick Liquid: Not tested   Honey Thick Honey Thick  Liquid: Not tested   Puree Puree: Not tested   Solid     Solid: Not tested     Maudry Mayhew, Student SLP Office: [phone]  07/02/2019,11:46 AM

## 2019-07-02 NOTE — Progress Notes (Signed)
Pt order for BIPAP is PRN. Pt is not in need of at this time. RT will continue to monitor.

## 2019-07-03 ENCOUNTER — Inpatient Hospital Stay (HOSPITAL_COMMUNITY): Payer: Medicare HMO

## 2019-07-03 DIAGNOSIS — I480 Paroxysmal atrial fibrillation: Secondary | ICD-10-CM

## 2019-07-03 DIAGNOSIS — R4182 Altered mental status, unspecified: Secondary | ICD-10-CM

## 2019-07-03 DIAGNOSIS — I639 Cerebral infarction, unspecified: Secondary | ICD-10-CM

## 2019-07-03 LAB — CBC
HCT: 34.1 % — ABNORMAL LOW (ref 39.0–52.0)
Hemoglobin: 11.2 g/dL — ABNORMAL LOW (ref 13.0–17.0)
MCH: 29.5 pg (ref 26.0–34.0)
MCHC: 32.8 g/dL (ref 30.0–36.0)
MCV: 89.7 fL (ref 80.0–100.0)
Platelets: 213 10*3/uL (ref 150–400)
RBC: 3.8 MIL/uL — ABNORMAL LOW (ref 4.22–5.81)
RDW: 12.4 % (ref 11.5–15.5)
WBC: 7.3 10*3/uL (ref 4.0–10.5)
nRBC: 0 % (ref 0.0–0.2)

## 2019-07-03 LAB — POCT I-STAT 7, (LYTES, BLD GAS, ICA,H+H)
Acid-Base Excess: 3 mmol/L — ABNORMAL HIGH (ref 0.0–2.0)
Bicarbonate: 28 mmol/L (ref 20.0–28.0)
Calcium, Ion: 1.19 mmol/L (ref 1.15–1.40)
HCT: 30 % — ABNORMAL LOW (ref 39.0–52.0)
Hemoglobin: 10.2 g/dL — ABNORMAL LOW (ref 13.0–17.0)
O2 Saturation: 95 %
Patient temperature: 97.6
Potassium: 3.3 mmol/L — ABNORMAL LOW (ref 3.5–5.1)
Sodium: 142 mmol/L (ref 135–145)
TCO2: 29 mmol/L (ref 22–32)
pCO2 arterial: 40.7 mmHg (ref 32.0–48.0)
pH, Arterial: 7.444 (ref 7.350–7.450)
pO2, Arterial: 71 mmHg — ABNORMAL LOW (ref 83.0–108.0)

## 2019-07-03 LAB — BASIC METABOLIC PANEL
Anion gap: 11 (ref 5–15)
BUN: 23 mg/dL (ref 8–23)
CO2: 27 mmol/L (ref 22–32)
Calcium: 8.8 mg/dL — ABNORMAL LOW (ref 8.9–10.3)
Chloride: 105 mmol/L (ref 98–111)
Creatinine, Ser: 0.73 mg/dL (ref 0.61–1.24)
GFR calc Af Amer: >60 mL/min (ref >60)
GFR calc non Af Amer: >60 mL/min (ref >60)
Glucose, Bld: 169 mg/dL — ABNORMAL HIGH (ref 70–99)
Potassium: 3.3 mmol/L — ABNORMAL LOW (ref 3.5–5.1)
Sodium: 143 mmol/L (ref 135–145)

## 2019-07-03 LAB — MAGNESIUM: Magnesium: 2 mg/dL (ref 1.7–2.4)

## 2019-07-03 LAB — PHOSPHORUS: Phosphorus: 3.5 mg/dL (ref 2.5–4.6)

## 2019-07-03 LAB — GLUCOSE, CAPILLARY: Glucose-Capillary: 165 mg/dL — ABNORMAL HIGH (ref 70–99)

## 2019-07-03 IMAGING — DX DG CHEST 1V PORT
1 series · 2 of 2 positions shown · non-contrast
Comparison: [DATE]

CLINICAL DATA: Respiratory failure, check endotracheal tube
placement

EXAM:
PORTABLE CHEST 1 VIEW

[Series 1: chest · 0.14mm/px · 2 of 2 slices shown]
[im 1/2]
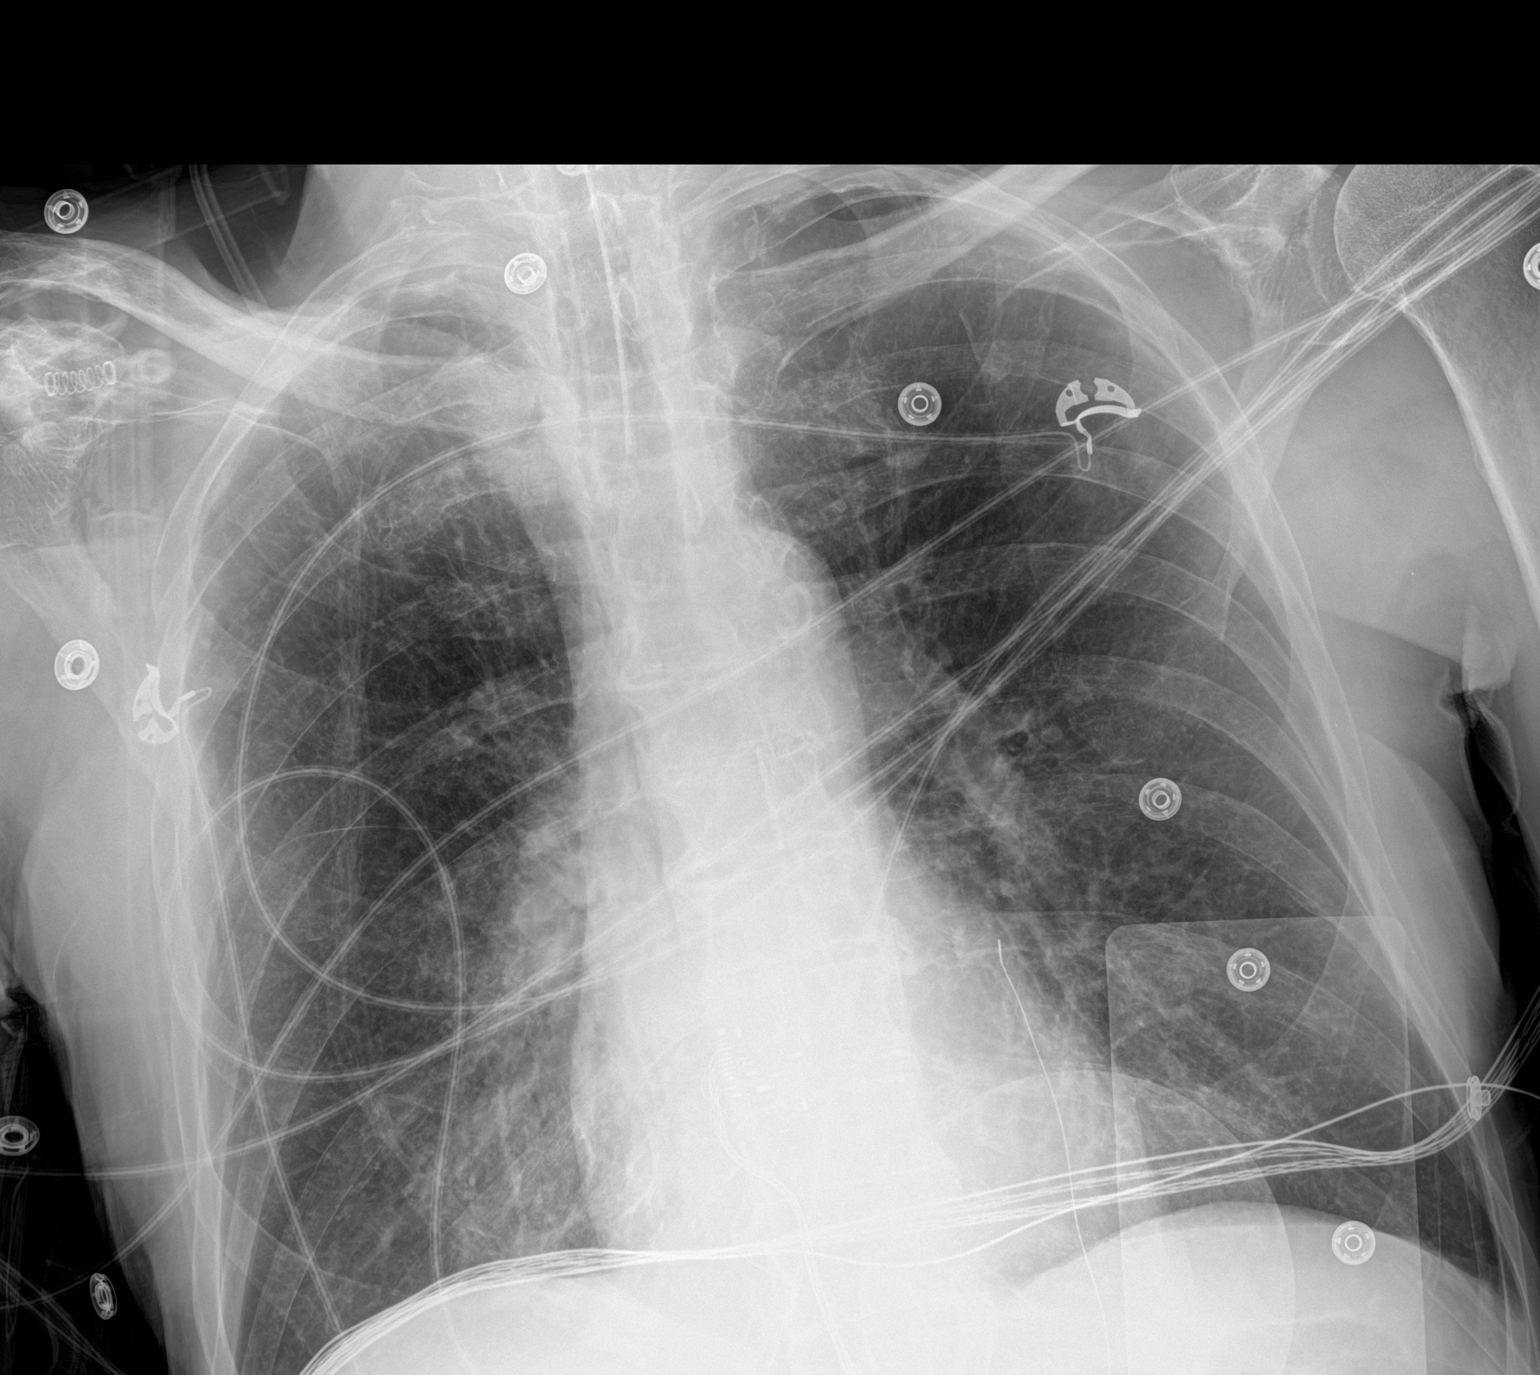
[im 2/2]
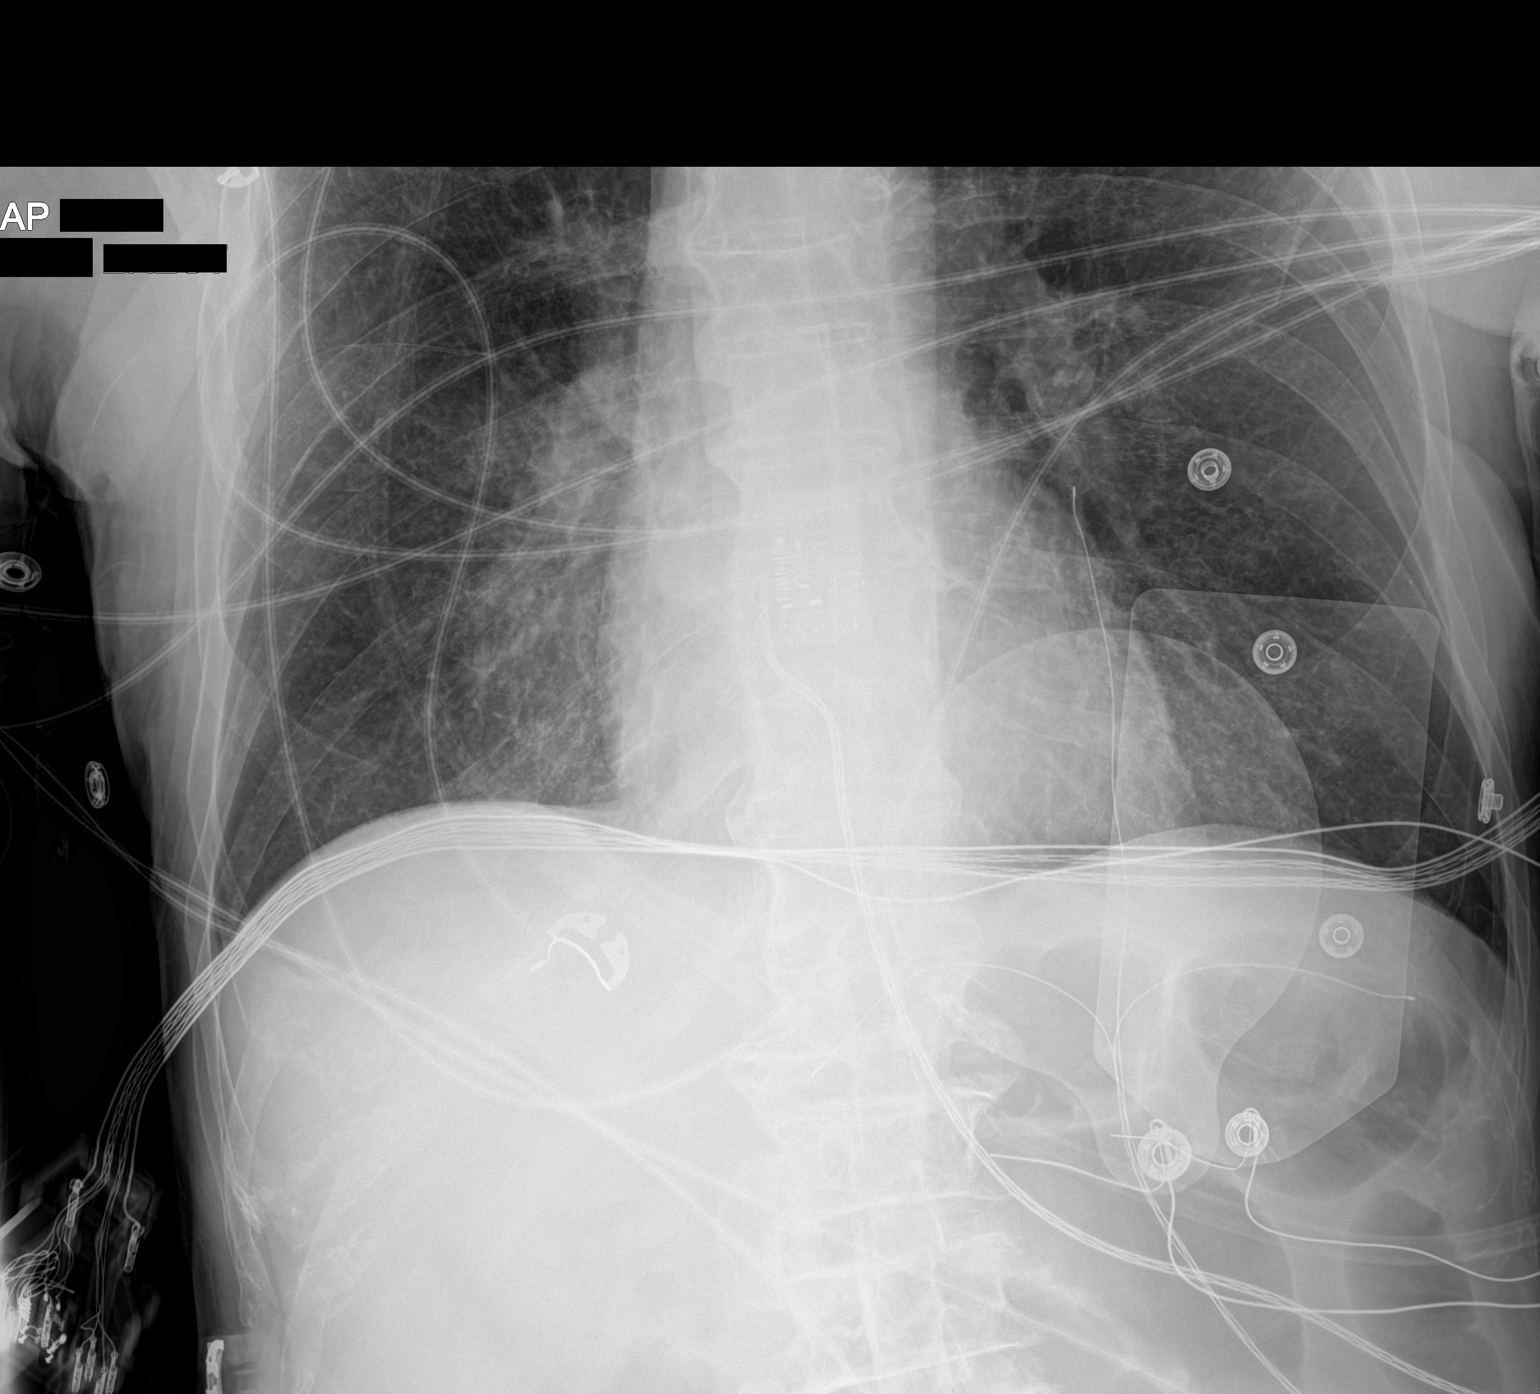

[2 of 2 positions shown; findings below may reference images not displayed]

FINDINGS: Cardiac shadow is within normal limits. Endotracheal tube is noted
in satisfactory position. Mild aortic calcifications are seen. Lungs
are well aerated bilaterally. Persistent right infrahilar opacity is
noted. No acute bony abnormality is noted. Postsurgical changes in
the cervical spine are noted.
IMPRESSION: Stable right basilar density.

Endotracheal tube in satisfactory position.

## 2019-07-03 MED ORDER — VITAMIN B-12 1000 MCG PO TABS
2000.0000 ug | ORAL_TABLET | Freq: Every day | ORAL | Status: DC
  Administered 2019-07-06 – 2019-07-21 (×16): 2000 ug
  Filled 2019-07-03 (×20): qty 2

## 2019-07-03 MED ORDER — POTASSIUM CHLORIDE 10 MEQ/100ML IV SOLN
10.0000 meq | INTRAVENOUS | Status: AC
  Administered 2019-07-03 (×4): 10 meq via INTRAVENOUS
  Filled 2019-07-03 (×4): qty 100

## 2019-07-03 MED ORDER — CHLORHEXIDINE GLUCONATE 0.12% ORAL RINSE (MEDLINE KIT)
15.0000 mL | Freq: Two times a day (BID) | OROMUCOSAL | Status: DC
  Administered 2019-07-03 – 2019-07-07 (×9): 15 mL via OROMUCOSAL

## 2019-07-03 MED ORDER — LEVETIRACETAM IN NACL 500 MG/100ML IV SOLN
500.0000 mg | Freq: Two times a day (BID) | INTRAVENOUS | Status: DC
  Administered 2019-07-03 – 2019-07-17 (×30): 500 mg via INTRAVENOUS
  Filled 2019-07-03 (×30): qty 100

## 2019-07-03 MED ORDER — ASPIRIN 81 MG PO CHEW
81.0000 mg | CHEWABLE_TABLET | Freq: Every day | ORAL | Status: DC
  Administered 2019-07-06 – 2019-07-21 (×16): 81 mg
  Filled 2019-07-03 (×18): qty 1

## 2019-07-03 MED ORDER — ASPIRIN EC 81 MG PO TBEC: 81.0000 mg | DELAYED_RELEASE_TABLET | Freq: Every day | ORAL | Status: DC

## 2019-07-03 MED ORDER — DILTIAZEM HCL-DEXTROSE 125-5 MG/125ML-% IV SOLN (PREMIX)
5.0000 mg/h | INTRAVENOUS | Status: DC
  Filled 2019-07-03: qty 125

## 2019-07-03 MED ORDER — PANTOPRAZOLE SODIUM 40 MG IV SOLR
40.0000 mg | INTRAVENOUS | Status: DC
  Administered 2019-07-03 – 2019-07-07 (×5): 40 mg via INTRAVENOUS
  Filled 2019-07-03 (×5): qty 40

## 2019-07-03 MED ORDER — ORAL CARE MOUTH RINSE
15.0000 mL | OROMUCOSAL | Status: DC
  Administered 2019-07-03 – 2019-07-08 (×48): 15 mL via OROMUCOSAL

## 2019-07-03 MED ORDER — PANTOPRAZOLE SODIUM 40 MG IV SOLR: 40.0000 mg | Freq: Every day | INTRAVENOUS | Status: DC

## 2019-07-03 MED ORDER — MIDAZOLAM HCL 2 MG/2ML IJ SOLN
2.0000 mg | Freq: Once | INTRAMUSCULAR | Status: AC
  Administered 2019-07-03: 2 mg via INTRAVENOUS

## 2019-07-03 MED ORDER — FENTANYL CITRATE (PF) 100 MCG/2ML IJ SOLN
25.0000 ug | INTRAMUSCULAR | Status: DC | PRN
  Administered 2019-07-03: 25 ug via INTRAVENOUS
  Administered 2019-07-03: 100 ug via INTRAVENOUS
  Administered 2019-07-03: 25 ug via INTRAVENOUS
  Administered 2019-07-04 – 2019-07-05 (×6): 100 ug via INTRAVENOUS
  Filled 2019-07-03 (×9): qty 2

## 2019-07-03 MED ORDER — ACETAMINOPHEN 325 MG PO TABS
650.0000 mg | ORAL_TABLET | ORAL | Status: DC | PRN
  Administered 2019-07-06: 650 mg
  Filled 2019-07-03: qty 2

## 2019-07-03 MED ORDER — PANTOPRAZOLE SODIUM 40 MG PO PACK: 40.0000 mg | PACK | ORAL | Status: DC

## 2019-07-03 MED ORDER — MIDAZOLAM HCL 2 MG/2ML IJ SOLN
1.0000 mg | INTRAMUSCULAR | Status: DC | PRN
  Administered 2019-07-03 – 2019-07-04 (×10): 1 mg via INTRAVENOUS
  Filled 2019-07-03 (×11): qty 2

## 2019-07-03 MED ORDER — HEPARIN (PORCINE) 25000 UT/250ML-% IV SOLN
800.0000 [IU]/h | INTRAVENOUS | Status: DC
  Administered 2019-07-03: 1000 [IU]/h via INTRAVENOUS
  Administered 2019-07-04 – 2019-07-08 (×4): 800 [IU]/h via INTRAVENOUS
  Filled 2019-07-03 (×5): qty 250

## 2019-07-03 MED ORDER — LEVETIRACETAM 100 MG/ML PO SOLN: 500.0000 mg | Freq: Two times a day (BID) | ORAL | Status: DC

## 2019-07-03 MED ORDER — ATORVASTATIN CALCIUM 40 MG PO TABS
40.0000 mg | ORAL_TABLET | Freq: Every day | ORAL | Status: DC
  Administered 2019-07-05 – 2019-07-10 (×6): 40 mg
  Filled 2019-07-03 (×7): qty 1

## 2019-07-03 MED ORDER — FENTANYL CITRATE (PF) 100 MCG/2ML IJ SOLN: 25.0000 ug | INTRAMUSCULAR | Status: DC | PRN

## 2019-07-03 MED ORDER — MIDAZOLAM HCL 2 MG/2ML IJ SOLN: 1.0000 mg | INTRAMUSCULAR | Status: DC | PRN

## 2019-07-03 MED ORDER — ATORVASTATIN CALCIUM 80 MG PO TABS: 80.0000 mg | ORAL_TABLET | Freq: Every day | ORAL | Status: DC

## 2019-07-03 MED ORDER — ACETAMINOPHEN 650 MG RE SUPP: 650.0000 mg | RECTAL | Status: DC | PRN

## 2019-07-03 MED ORDER — DEXAMETHASONE SODIUM PHOSPHATE 10 MG/ML IJ SOLN
10.0000 mg | Freq: Two times a day (BID) | INTRAMUSCULAR | Status: DC
  Administered 2019-07-03: 10 mg via INTRAVENOUS
  Filled 2019-07-03: qty 1

## 2019-07-03 MED ORDER — HEPARIN BOLUS VIA INFUSION
3000.0000 [IU] | Freq: Once | INTRAVENOUS | Status: AC
  Administered 2019-07-03: 3000 [IU] via INTRAVENOUS
  Filled 2019-07-03: qty 3000

## 2019-07-03 NOTE — Progress Notes (Signed)
NAME:  Seth Boyd, MRN:  [id number], DOB:  July 03, 1934, LOS: 4 ADMISSION DATE:  07-10-19, CONSULTATION DATE:  07/10/19 REFERRING MD:  Ophelia Charter, CHIEF COMPLAINT:  Encephalopathy   Brief History   85 y.o. male, goes by Seth Boyd, with prior hx HTN/ DM presenting after being found on floor with acute encephalopathy.  Presented as code stroke.  Noted to have initial left gaze.  LSW 1/30 ~4 pm.  Rule out for stroke with negative CTH/ CTA.  Concern for seizure +/- toxic/ metabolic encephalopathy.    Past Medical History  HTN, DM  Significant Hospital Events   1/31 Admit 2/02 extubated 2/03 difficulty with agitation and airway protection, weak cough 2/04 reintubated, a flutter  Consults:  Neurology Cardiology  Procedures:  1/31 ETT >> 2/02 2/04 ETT  Significant Diagnostic Tests:  1/31 CT head/neck >> 30% ICA stenosis on the right, 20% on the left, 30-50% narrowing in both carotid siphon regions but no correctable proximal stenosis. 1/31 EEG >> moderate diffuse encephalopathy, no epileptiform activity 2/01 brain MRI >> Moderate cerebral atrophy with chronic microvascular ischemic disease, Soft tissue edema within the right occipital/suboccipital scalp. 2/01 LTM  >> moderate diffuse encephalopathy  Micro Data:  1/31 SARS 2/ flu A/B >> neg 1/31 MRSA PCR >> negative  Antimicrobials:  Unasyn 2/02 >>   Interim history/subjective:  reintubated overnight.  Developed A flutter.  Objective   Blood pressure 116/60, pulse 72, temperature 97.6 F (36.4 C), temperature source Axillary, resp. rate 13, height 5\' 10"  (1.778 m), weight 65.5 kg, SpO2 100 %.    Vent Mode: PRVC FiO2 (%):  [32 %-40 %] 40 % Set Rate:  [12 bmp] 12 bmp Vt Set:  [580 mL] 580 mL PEEP:  [5 cmH20] 5 cmH20 Plateau Pressure:  [11 cmH20-16 cmH20] 16 cmH20   Intake/Output Summary (Last 24 hours) at 07/03/2019 08/31/2019 Last data filed at 07/03/2019 0800 Gross per 24 hour  Intake 2222.02 ml  Output 725 ml  Net 1497.02 ml    Filed Weights   07/01/19 0500 07/02/19 0500 07/03/19 0300  Weight: 62.5 kg 62.5 kg 65.5 kg   Examination:  General - sedated Eyes - pupils reactive ENT - ETT in place Cardiac - regular, tachycardic Chest - equal breath sounds b/l, no wheezing or rales Abdomen - soft, non tender, + bowel sounds Extremities - decreased muscle bulk Skin - no rashes Neuro - RASS -2   Resolved Hospital Problem list   Rhabdomyolysis  Assessment & Plan:   Acute hypoxic respiratory failure with compromised airway. - reintubated 2/04 - f/u CXR  Aspiration pneumonia. - day 3 of unasyn  Acute metabolic encephalopathy - cause unclear, but initially felt to be from unwitnessed seizure. - continue AEDs - RASS goal 0 to -2  Hypokalemia. Hypophosphatemia. - replace as needed  New onset Atrial flutter. Episodes of bradycardia. HTN, HLD. - questioning whether he could have tachy-brady episodes that lead to syncopal event - add cardizem gtt - check Echo - f/u cardiac enzymes - will ask cardiology to assess - ASA, lipitor  Dysphagia. Severe protein calorie malnutrition. - tube feeds  DM type 2 poorly controlled. - SSI  B12 deficiency.  - B12 supplementation initiated 2/1  Hx of GERD. - continue protonix   Best practice:  Diet: tube feeds DVT prophylaxis: SCDs/heparin GI prophylaxis: PPI  Glucose control: monitor Mobility: bed Code Status: full Disposition: ICU  Labs    CMP Latest Ref Rng & Units 07/03/2019 07/03/2019 07/02/2019  Glucose 70 - 99 mg/dL -  169(H) 158(H)  BUN 8 - 23 mg/dL - 23 17  Creatinine 0.61 - 1.24 mg/dL - 0.73 0.90  Sodium 135 - 145 mmol/L 142 143 143  Potassium 3.5 - 5.1 mmol/L 3.3(L) 3.3(L) 2.9(L)  Chloride 98 - 111 mmol/L - 105 100  CO2 22 - 32 mmol/L - 27 28  Calcium 8.9 - 10.3 mg/dL - 8.8(L) 9.0  Total Protein 6.5 - 8.1 g/dL - - -  Total Bilirubin 0.3 - 1.2 mg/dL - - -  Alkaline Phos 38 - 126 U/L - - -  AST 15 - 41 U/L - - -  ALT 0 - 44 U/L - - -     CBC Latest Ref Rng & Units 07/03/2019 07/03/2019 07/01/2019  WBC 4.0 - 10.5 K/uL - 7.3 9.2  Hemoglobin 13.0 - 17.0 g/dL 10.2(L) 11.2(L) 11.7(L)  Hematocrit 39.0 - 52.0 % 30.0(L) 34.1(L) 35.8(L)  Platelets 150 - 400 K/uL - 213 183    ABG    Component Value Date/Time   PHART 7.444 07/03/2019 0422   PCO2ART 40.7 07/03/2019 0422   PO2ART 71.0 (L) 07/03/2019 0422   HCO3 28.0 07/03/2019 0422   TCO2 29 07/03/2019 0422   O2SAT 95.0 07/03/2019 0422    CBG (last 3)  Recent Labs    07/03/19 0808  GLUCAP 165*    CC time 32 minutes  Chesley Mires, MD [place] 07/03/2019, 9:29 AM

## 2019-07-03 NOTE — Progress Notes (Addendum)
PCCM Interval Progress Note  Called to bedside for desaturation and unresponsiveness.  Per RN, pt desaturated and eyes rolled to back of head before he became apneic.  RT at bedside actively bagging pt.  I intubated emergently without any medications. Vocal cords extremely edematous.  Will order decadron x 2 doses.   Seth Boyd, [place] [facility] 07/03/2019, 3:02 AM

## 2019-07-03 NOTE — Procedures (Signed)
Intubation Procedure Note Seth Boyd [id number] 1934-10-12  Procedure: Intubation Indications: Respiratory insufficiency  Procedure Details Consent: Unable to obtain consent because of emergent medical necessity. Time Out: Verified patient identification, verified procedure, site/side was marked, verified correct patient position, special equipment/implants available, medications/allergies/relevent history reviewed, required imaging and test results available.  Performed  Drugs:  None. VL with glidescope with # 4 blade. Grade 1 view.  Vocal cords extremely edematous. 7.5 tube visualized passing through vocal cords. Following intubation:  positive color change on ETCO2, condensation seen in endotracheal tube, equal breath sounds bilaterally.  Evaluation Hemodynamic Status: BP stable throughout; O2 sats: stable throughout (required bagging). Patient's Current Condition: stable Complications: No apparent complications Patient did tolerate procedure well. Chest X-ray ordered to verify placement.  CXR: pending.   Seth Boyd, [place] [facility] [place] [facility] Pager: [phone]  or [phone] 07/03/2019, 3:03 AM

## 2019-07-03 NOTE — Progress Notes (Signed)
Attempted to place OG and NG.  Tube coiling in back of mouth.  Multiple prior attempts by RNs.  Will change meds to IV as able for now, and arrange for cortrak placement.  Coralyn Helling, MD [facility] Pulmonary/Critical Care 07/03/2019, 10:54 AM

## 2019-07-03 NOTE — Progress Notes (Signed)
SLP Cancellation Note  Patient Details Name: Seth Boyd MRN: [id number] DOB: [birth year]   Cancelled treatment:       Reason Eval/Treat Not Completed: Medical issues which prohibited therapy. Pt intubated, on the vent. Will f/u as able.    Mahala Menghini., M.A. CCC-SLP Acute Rehabilitation Services Pager [phone] Office [phone]  07/03/2019, 8:02 AM

## 2019-07-03 NOTE — Progress Notes (Signed)
ANTICOAGULATION CONSULT NOTE - Initial Consult  Pharmacy Consult for heparin Indication: atrial fibrillation  No Known Allergies  Patient Measurements: Height: 5\' 10"  (177.8 cm) Weight: 144 lb 6.4 oz (65.5 kg) IBW/kg (Calculated) : 73 Heparin Dosing Weight: 65kg  Vital Signs: Temp: 97.5 F (36.4 C) (02/04 1400) BP: 148/76 (02/04 1740) Pulse Rate: 69 (02/04 1740)  Labs: Recent Labs    07/01/19 0511 07/01/19 0511 07/02/19 0933 07/03/19 0353 07/03/19 0422  HGB 11.7*   < >  --  11.2* 10.2*  HCT 35.8*  --   --  34.1* 30.0*  PLT 183  --   --  213  --   CREATININE 0.66  --  0.90 0.73  --   CKTOTAL 367  --   --   --   --    < > = values in this interval not displayed.    Estimated Creatinine Clearance: 66 mL/min (by C-G formula based on SCr of 0.73 mg/dL).   Medical History: Past Medical History:  Diagnosis Date  . DM (diabetes mellitus) (HCC)   . Essential hypertension      Assessment: 43 yoM admitted as code stroke with negative imaging. Pt developed AFib this morning during respiratory arrest requiring intubation. Pharmacy asked to begin IV heparin. CHADSVASc = 4, no AC PTA, CBC wnl this am. Pt received SQ heparin for VTE ppx ~1330. Will target normal heparin level goal given negative stroke workup.   Goal of Therapy:  Heparin level 0.3-0.7 units/ml Monitor platelets by anticoagulation protocol: Yes   Plan:  -Heparin 3000 units x1 -Heparin 1000 units/hr -Check 8-hr heparin level -Monitor heparin level, CBC, S/Sx bleeding daily  97, PharmD, BCPS Clinical Pharmacist [phone] Please check AMION for all [facility] numbers 07/03/2019

## 2019-07-03 NOTE — Progress Notes (Signed)
RT obtained ABG on pt with the following results and not changes needed at this time. Pt respiratory status is stable on the vent at this time. RT will continue to monitor.   Results for Seth Boyd, Seth Boyd (MRN [id number]) as of 07/03/2019 04:58  Ref. Range 07/03/2019 04:22  Sample type Unknown ARTERIAL  pH, Arterial Latest Ref Range: 7.350 - 7.450  7.444  pCO2 arterial Latest Ref Range: 32.0 - 48.0 mmHg 40.7  pO2, Arterial Latest Ref Range: 83.0 - 108.0 mmHg 71.0 (L)  TCO2 Latest Ref Range: 22 - 32 mmol/L 29  Acid-Base Excess Latest Ref Range: 0.0 - 2.0 mmol/L 3.0 (H)  Bicarbonate Latest Ref Range: 20.0 - 28.0 mmol/L 28.0  O2 Saturation Latest Units: % 95.0  Patient temperature Unknown 97.6 F  Collection site Unknown BRACHIAL ARTERY

## 2019-07-03 NOTE — Consult Note (Signed)
Cardiology Consultation:   Patient ID: Seth Boyd MRN: [id number]; DOB: Sep 30, 1934  Admit date: 06/16/2019 Date of Consult: 07/03/2019  Primary Care Provider: Patient, No Pcp Per Primary Cardiologist: New, Seth Boyd  Primary Electrophysiologist:  None    Patient Profile:   Seth Boyd is a 85 y.o. male with a hx of hypertension diabetes mellitus who is being seen today for the evaluation of atrial fibrillation/atrial flutter at the request of Dr. Halford Boyd.Marland Kitchen  History of Present Illness:   Seth Boyd is an 85 year old gentleman with a history of hypertension and diabetes mellitus.  He was admitted on January 31 with a code stroke.  This morning he was noted to have atrial fibrillation and atrial flutter and we were asked to consult for further evaluation.  The MRI from February 1 reveals small vessel disease, and moderate cerebral atrophy without evidence of a large stroke.  This morning the patient became unresponsive and had a respiratory arrest.  He was intubated.  At that time he was noted to be in atrial fibrillation/atrial flutter.  There was some thought that he may have been profoundly bradycardic this morning but review of the telemetry strips reveal the slowest heart rate was in the 50s and the highest heart rate in the 80s to 90s.  There was one episode of normal sinus rhythm this morning.  There was no postconversion pause when looking back from the transition from atrial fibrillation to normal sinus rhythm.  The patient is currently on the ventilator.  I am not able to get any further history or family history.   Heart Pathway Score:     Past Medical History:  Diagnosis Date  . DM (diabetes mellitus) ([place])   . Essential hypertension     Past Surgical History:  Procedure Laterality Date  . c-spine       Home Medications:  Prior to Admission medications   Medication Sig Start Date End Date Taking? Authorizing Provider  aspirin EC 81 MG tablet Take 81 mg by mouth  daily.   Yes [provider]  atorvastatin (LIPITOR) 80 MG tablet Take 80 mg by mouth at bedtime. 04/16/19  Yes [provider]  lisinopril-hydrochlorothiazide (ZESTORETIC) 20-12.5 MG tablet Take 1 tablet by mouth daily. 04/27/19  Yes [provider]  metFORMIN (GLUCOPHAGE) 1000 MG tablet Take 1,000 mg by mouth 2 (two) times daily. 06/23/19  Yes [provider]  Omega-3 Fatty Acids (FISH OIL PO) Take 1 capsule by mouth daily.   Yes [provider]  pantoprazole (PROTONIX) 40 MG tablet Take 40 mg by mouth daily. 05/20/19  Yes [provider]  tamsulosin (FLOMAX) 0.4 MG CAPS capsule Take 0.4 mg by mouth 2 (two) times daily. 06/05/19  Yes [provider]    Inpatient Medications: Scheduled Meds: . aspirin  81 mg Per Tube Daily  . atorvastatin  40 mg Per Tube QHS  . chlorhexidine  15 mL Mouth Rinse BID  . chlorhexidine gluconate (MEDLINE KIT)  15 mL Mouth Rinse BID  . Chlorhexidine Gluconate Cloth  6 each Topical Q0600  . heparin  5,000 Units Subcutaneous Q8H  . mouth rinse  15 mL Mouth Rinse 10 times per day  . pantoprazole (PROTONIX) IV  40 mg Intravenous Q24H  . vitamin B-12  2,000 mcg Per Tube Daily   Continuous Infusions: . ampicillin-sulbactam (UNASYN) IV 3 g (07/03/19 1334)  . dexmedetomidine (PRECEDEX) IV infusion Stopped (07/03/19 0403)  . diltiazem (CARDIZEM) infusion    . feeding supplement (VITAL AF 1.2  CAL)    . lactated ringers 50 mL/hr at 07/03/19 0700  . levETIRAcetam 500 mg (07/03/19 1205)   PRN Meds: acetaminophen, acetaminophen, fentaNYL (SUBLIMAZE) injection, labetalol, midazolam  Allergies:   No Known Allergies  Social History:   Social History   Socioeconomic History  . Marital status: Single    Spouse name: Not on file  . Number of children: Not on file  . Years of education: Not on file  . Highest education level: Not on file  Occupational History  . Not on file  Tobacco Use  . Smoking  status: Not on file  Substance and Sexual Activity  . Alcohol use: Not on file  . Drug use: Not on file  . Sexual activity: Not on file  Other Topics Concern  . Not on file  Social History Narrative  . Not on file   Social Determinants of Health   Financial Resource Strain:   . Difficulty of Paying Living Expenses: Not on file  Food Insecurity:   . Worried About Charity fundraiser in the Last Year: Not on file  . Ran Out of Food in the Last Year: Not on file  Transportation Needs:   . Lack of Transportation (Medical): Not on file  . Lack of Transportation (Non-Medical): Not on file  Physical Activity:   . Days of Exercise per Week: Not on file  . Minutes of Exercise per Session: Not on file  Stress:   . Feeling of Stress : Not on file  Social Connections:   . Frequency of Communication with Friends and Family: Not on file  . Frequency of Social Gatherings with Friends and Family: Not on file  . Attends Religious Services: Not on file  . Active Member of Clubs or Organizations: Not on file  . Attends Archivist Meetings: Not on file  . Marital Status: Not on file  Intimate Partner Violence:   . Fear of Current or Ex-Partner: Not on file  . Emotionally Abused: Not on file  . Physically Abused: Not on file  . Sexually Abused: Not on file    Family History:   History reviewed. No pertinent family history.   ROS:  Please see the history of present illness.   All other ROS reviewed and negative.     Physical Exam/Data:   Vitals:   07/03/19 1504 07/03/19 1600 07/03/19 1700 07/03/19 1740  BP: (!) 139/49 (!) 146/66 (!) 149/101 (!) 148/76  Pulse: (!) 55 67 68 69  Resp: 14 16 12 16   Temp:      TempSrc:      SpO2: 100% 100% 95% 100%  Weight:      Height:        Intake/Output Summary (Last 24 hours) at 07/03/2019 1811 Last data filed at 07/03/2019 1700 Gross per 24 hour  Intake 1647.9 ml  Output 925 ml  Net 722.9 ml   Last 3 Weights 07/03/2019 07/02/2019  07/01/2019  Weight (lbs) 144 lb 6.4 oz 137 lb 12.6 oz 137 lb 12.6 oz  Weight (kg) 65.5 kg 62.5 kg 62.5 kg     Body mass index is 20.72 kg/m.  General: Elderly gentleman, he is currently on the ventilator. HEENT: ET tube in place. Lymph: no adenopathy Neck: no JVD Endocrine:  No thryomegaly Vascular: No carotid bruits; FA pulses 2+ bilaterally without bruits  Cardiac: Irregularly irregular.  Soft systolic murmur. Lungs:  clear to auscultation bilaterally, no wheezing, rhonchi or rales  Abd: soft, nontender, no  hepatomegaly  Ext: no edema Musculoskeletal:  No deformities, BUE and BLE strength normal and equal Skin: warm and dry  Neuro: Unable to assess. Psych: Unable to assess  EKG: June 29, 2019: Normal sinus rhythm.  Telemetry:  Telemetry from February 4 reveals coarse atrial fibrillation/atrial flutter with a variable response.  Relevant CV Studies:   Laboratory Data:  High Sensitivity Troponin:  No results for input(s): TROPONINIHS in the last 720 hours.   Chemistry Recent Labs  Lab 07/01/19 0511 07/01/19 0511 07/02/19 0933 07/03/19 0353 07/03/19 0422  NA 137   < > 143 143 142  K 3.3*   < > 2.9* 3.3* 3.3*  CL 102  --  100 105  --   CO2 22  --  28 27  --   GLUCOSE 112*  --  158* 169*  --   BUN 14  --  17 23  --   CREATININE 0.66  --  0.90 0.73  --   CALCIUM 8.7*  --  9.0 8.8*  --   GFRNONAA >60  --  >60 >60  --   GFRAA >60  --  >60 >60  --   ANIONGAP 13  --  15 11  --    < > = values in this interval not displayed.    Recent Labs  Lab 06/17/2019 1127 06/09/2019 2244 07/01/19 0511  PROT 8.0 6.5  --   ALBUMIN 4.2 3.4* 2.7*  AST 75* 79*  --   ALT 43 42  --   ALKPHOS 100 86  --   BILITOT 0.6 1.2  --    Hematology Recent Labs  Lab 06/22/2019 1610 06/21/2019 1623 07/01/19 0511 07/03/19 0353 07/03/19 0422  WBC 12.7*  --  9.2 7.3  --   RBC 4.57  --  4.00* 3.80*  --   HGB 13.5   < > 11.7* 11.2* 10.2*  HCT 39.8   < > 35.8* 34.1* 30.0*  MCV 87.1  --  89.5  89.7  --   MCH 29.5  --  29.3 29.5  --   MCHC 33.9  --  32.7 32.8  --   RDW 12.1  --  12.5 12.4  --   PLT 269  --  183 213  --    < > = values in this interval not displayed.   BNP Recent Labs  Lab 06/11/2019 1610  BNP 745.7*    DDimer No results for input(s): DDIMER in the last 168 hours.   Radiology/Studies:  EEG  Result Date: 06/30/2019 Seth Havens, MD     06/30/2019 12:23 PM Patient Name: Seth Boyd MRN: [id number] Epilepsy Attending: Lora Boyd Referring Physician/Provider: Dr Karena Addison Aroor Date: 06/30/2019 Duration: 22.13 mins  Patient history: 85 y.o.malewith PMH DM who presented to ED with c/o aphasia and right side weakness. CTH was negative for hemorrhage. EEG to evaluate for seizure  Level of alertness: awake  AEDs during EEG study: LEV  Technical aspects: This EEG study was done with scalp electrodes positioned according to the 10-20 International system of electrode placement. Electrical activity was acquired at a sampling rate of 500Hz  and reviewed with a high frequency filter of 70Hz  and a low frequency filter of 1Hz . EEG data were recorded continuously and digitally stored.  DESCRIPTION: No clear posterior dominant rhythm was seen. EEG showed continuous generalized 3-6Hz  theta-delta slowing. Hyperventilation and photic stimulation were not performed due to ams.  ABNORMALITY - Continuous slow, generalized   IMPRESSION: This study  is suggestive of moderate diffuse encephalopathy,,non specific to etiology. No seizures or epileptiform discharges were seen throughout the recording. EEG appears simi;lar to previous study performed on 06/01/2019 Seth Boyd   DG Abd 1 View  Result Date: 06/05/2019 CLINICAL DATA:  Abdominal distension. EXAM: ABDOMEN - 1 VIEW COMPARISON:  None. FINDINGS: The bowel gas pattern is normal. No radio-opaque calculi or other significant radiographic abnormality are seen. IMPRESSION: Negative. Electronically Signed   By: Constance Holster M.D.   On: 06/23/2019 22:45   MR BRAIN W WO CONTRAST  Result Date: 06/30/2019 CLINICAL DATA:  Initial evaluation for acute encephalopathy. Possible seizure. EXAM: MRI HEAD WITHOUT AND WITH CONTRAST TECHNIQUE: Multiplanar, multiecho pulse sequences of the brain and surrounding structures were obtained without and with intravenous contrast. CONTRAST:  6.48m GADAVIST GADOBUTROL 1 MMOL/ML IV SOLN COMPARISON:  Prior CTs from 06/14/2019. FINDINGS: Brain: Examination mildly degraded by motion artifact. Diffuse prominence of the CSF containing spaces compatible generalized age-related cerebral atrophy. Patchy T2/FLAIR hyperintensity within the periventricular and deep white matter both cerebral hemispheres most consistent with chronic small vessel ischemic disease, moderate in nature. Subcentimeter focus of FLAIR hyperintensity involving the subcortical parasagittal left occipital lobe noted (series 11, image 15), nonspecific, but favored to be small vessel related. No other imaging findings to suggest PRES. No abnormal foci of restricted diffusion to suggest acute or subacute ischemia or changes related to seizure. Gray-white matter differentiation maintained. No encephalomalacia to suggest chronic cortical infarction. No acute intracranial hemorrhage. Single punctate chronic microhemorrhage noted involving the left periatrial white matter, likely small vessel related. No other evidence for chronic intracranial hemorrhage. No mass lesion, midline shift or mass effect. Diffuse ventricular prominence related to global parenchymal volume loss without hydrocephalus. No extra-axial fluid collection. Pituitary gland suprasellar region normal. Midline structures intact. No abnormal enhancement. Vascular: Major intracranial vascular flow voids are maintained. Skull and upper cervical spine: Craniocervical junction within normal limits. Bone marrow signal intensity normal. Soft tissue edema present within the right  occipital/suboccipital scalp (series 11, image 10), of uncertain significance. Sinuses/Orbits: Globes and orbital soft tissues within normal limits. Patient status post ocular lens replacement on the right. Scattered mucosal thickening noted throughout the paranasal sinuses. Fluid seen layering within the nasopharynx. Patient is intubated. No significant mastoid effusion. Inner ear structures grossly normal. Other: None. IMPRESSION: 1. No acute intracranial process identified. 2. Moderate cerebral atrophy with chronic microvascular ischemic disease. 3. Soft tissue edema within the right occipital/suboccipital scalp, of uncertain etiology. Query recent fall. Correlation with physical exam recommended. Electronically Signed   By: BJeannine BogaM.D.   On: 06/30/2019 01:43   Portable Chest xray  Result Date: 07/03/2019 CLINICAL DATA:  Respiratory failure, check endotracheal tube placement EXAM: PORTABLE CHEST 1 VIEW COMPARISON:  07/01/2019 FINDINGS: Cardiac shadow is within normal limits. Endotracheal tube is noted in satisfactory position. Mild aortic calcifications are seen. Lungs are well aerated bilaterally. Persistent right infrahilar opacity is noted. No acute bony abnormality is noted. Postsurgical changes in the cervical spine are noted. IMPRESSION: Stable right basilar density. Endotracheal tube in satisfactory position. Electronically Signed   By: MInez CatalinaM.D.   On: 07/03/2019 03:28   DG CHEST PORT 1 VIEW  Result Date: 07/01/2019 CLINICAL DATA:  Increased work of breathing EXAM: PORTABLE CHEST 1 VIEW COMPARISON:  06/22/2019 FINDINGS: The heart size and mediastinal contours are within normal limits. Slightly increased right infrahilar opacity. The visualized skeletal structures are unremarkable. IMPRESSION: Slightly increased right infrahilar opacity, which may indicate  developing consolidation. Electronically Signed   By: Ulyses Jarred M.D.   On: 07/01/2019 23:47   DG CHEST PORT 1  VIEW  Result Date: 06/07/2019 CLINICAL DATA:  Endotracheal tube placement EXAM: PORTABLE CHEST 1 VIEW COMPARISON:  06/01/2019 FINDINGS: The endotracheal tube terminates above the carina by approximately 4.8 cm. There is an infrahilar airspace opacity on the right. There is no pneumothorax. No large pleural effusion. There is no acute osseous abnormality. The heart size is stable. Aortic calcifications are noted. IMPRESSION: 1. Endotracheal tube as above. 2. Developing right infrahilar airspace opacity concerning for pneumonia. Electronically Signed   By: Constance Holster M.D.   On: 06/24/2019 22:44   Overnight EEG with video  Result Date: 07/01/2019 Seth Havens, MD     07/01/2019  2:05 PM Patient Name:Bandy Sanmiguel XNT:[id number] Epilepsy Attending:Priyanka Barbra Sarks Referring Physician/Provider:David Tamala Julian, [place] Duration: 06/30/2019 1136 to 07/01/2019 9675  Patient FFMBWGY:85 y.o.malewith PMH DM who presented to ED with c/o aphasia and right side weakness. CTH was negative for hemorrhage. EEG to evaluate for seizure  Level of alertness:awake, asleep  AEDs during EEG study:LEV  Technical aspects: This EEG study was done with scalp electrodes positioned according to the 10-20 International system of electrode placement. Electrical activity was acquired at a sampling rate of 500Hz  and reviewed with a high frequency filter of 70Hz  and a low frequency filter of 1Hz . EEG data were recorded continuously and digitally stored.  DESCRIPTION: During awake state, no clear posterior dominant rhythm was seen.  Sleep was characterized by vertex waves, sleep spindles (13 to 15 Hz), maximal frontocentral.  EEG showed continuous generalized 3-6Hz  theta-delta slowing as well as intermittent rhythmic generalized 2 to 3 Hz delta slowing. Hyperventilation and photic stimulation were not performeddue to ams.  ABNORMALITY - Continuous slow, generalized -Intermittent rhythmic slow, generalized  IMPRESSION: This  study issuggestive of moderate diffuse encephalopathy,non specific to etiology.No seizures or epileptiform discharges were seen throughout the recording. Seth Boyd   {   Assessment and Plan:   1. Atrial fibrillation: The patient presents with an episode similar to a stroke.  It is surprising that the MRI did not identify a stroke.  He now has found to have atrial fibrillation.  He will need to be started on an anticoagulant.  Will start heparin tonight  Anticipate transitioning him to [place] but will wait and see if he needs any further procedures   We will get an echocardiogram.  At present his rate is well controlled and he has had episodes of bradycardia.  I would not start him on Cardizem at this time.   TSH is normal       For questions or updates, please contact [place] Please consult [email or web address] for contact info under     Signed, Mertie Moores, MD  07/03/2019 6:11 PM

## 2019-07-04 ENCOUNTER — Inpatient Hospital Stay (HOSPITAL_COMMUNITY): Payer: Medicare HMO

## 2019-07-04 DIAGNOSIS — I4891 Unspecified atrial fibrillation: Secondary | ICD-10-CM

## 2019-07-04 LAB — BASIC METABOLIC PANEL
Anion gap: 11 (ref 5–15)
BUN: 30 mg/dL — ABNORMAL HIGH (ref 8–23)
CO2: 27 mmol/L (ref 22–32)
Calcium: 9.1 mg/dL (ref 8.9–10.3)
Chloride: 107 mmol/L (ref 98–111)
Creatinine, Ser: 0.79 mg/dL (ref 0.61–1.24)
GFR calc Af Amer: >60 mL/min (ref >60)
GFR calc non Af Amer: >60 mL/min (ref >60)
Glucose, Bld: 198 mg/dL — ABNORMAL HIGH (ref 70–99)
Potassium: 4.2 mmol/L (ref 3.5–5.1)
Sodium: 145 mmol/L (ref 135–145)

## 2019-07-04 LAB — ECHOCARDIOGRAM COMPLETE
Height: 70 in
Weight: 2310.42 oz

## 2019-07-04 LAB — CBC
HCT: 35.3 % — ABNORMAL LOW (ref 39.0–52.0)
Hemoglobin: 11.4 g/dL — ABNORMAL LOW (ref 13.0–17.0)
MCH: 29.2 pg (ref 26.0–34.0)
MCHC: 32.3 g/dL (ref 30.0–36.0)
MCV: 90.3 fL (ref 80.0–100.0)
Platelets: 251 10*3/uL (ref 150–400)
RBC: 3.91 MIL/uL — ABNORMAL LOW (ref 4.22–5.81)
RDW: 12.5 % (ref 11.5–15.5)
WBC: 9.3 10*3/uL (ref 4.0–10.5)
nRBC: 0 % (ref 0.0–0.2)

## 2019-07-04 LAB — GLUCOSE, CAPILLARY
Glucose-Capillary: 113 mg/dL — ABNORMAL HIGH (ref 70–99)
Glucose-Capillary: 117 mg/dL — ABNORMAL HIGH (ref 70–99)
Glucose-Capillary: 177 mg/dL — ABNORMAL HIGH (ref 70–99)

## 2019-07-04 LAB — HEPARIN LEVEL (UNFRACTIONATED)
Heparin Unfractionated: 1.06 IU/mL — ABNORMAL HIGH (ref 0.30–0.70)
Heparin Unfractionated: 0.44 IU/mL (ref 0.30–0.70)

## 2019-07-04 IMAGING — DX DG CHEST 1V PORT
1 series · 1 of 1 positions shown · non-contrast
Comparison: Portable exam [F7] hours compared to [DATE]

CLINICAL DATA: Respiratory failure, intubation, stroke

EXAM:
PORTABLE CHEST 1 VIEW

[chest ap]
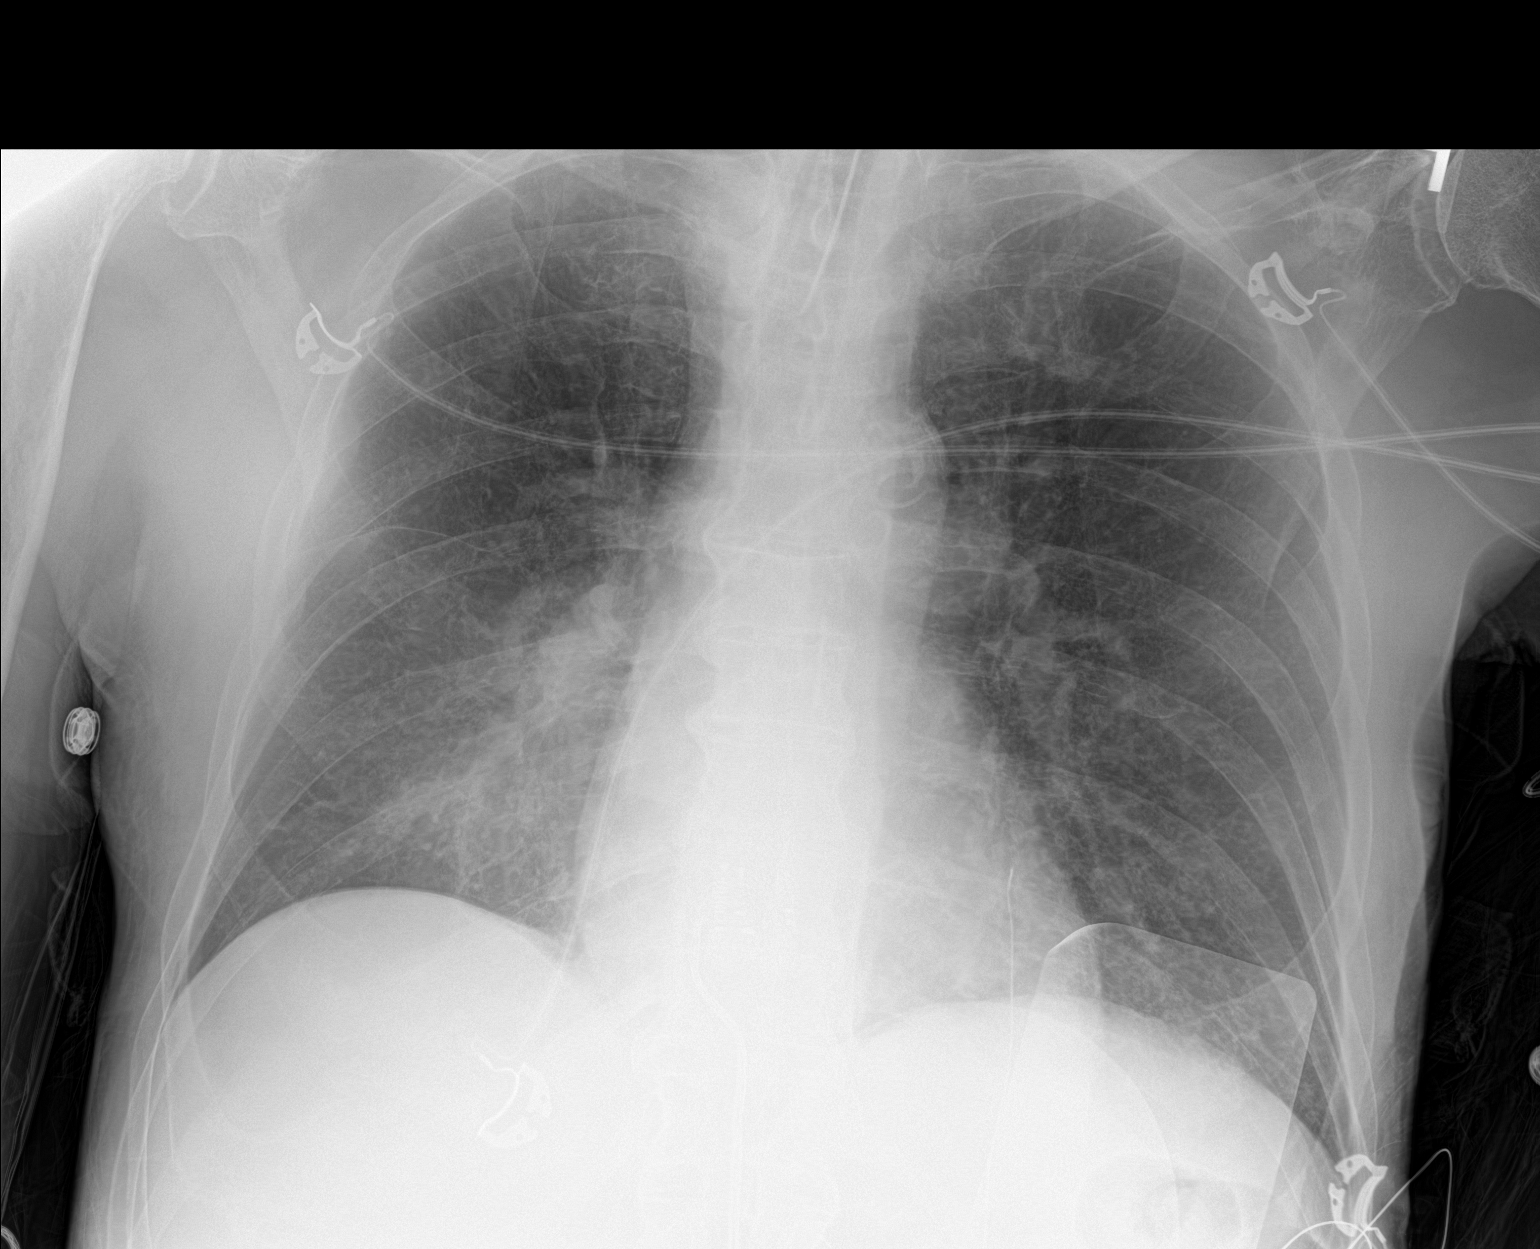

[1 of 1 positions shown; findings below may reference images not displayed]

FINDINGS: Tip of endotracheal tube projects 6.6 cm above carina.

External pacing leads project over chest.

Normal heart size, mediastinal contours, and pulmonary vascularity.

Atherosclerotic calcification aorta.

Bibasilar infiltrates which could represent pneumonia or aspiration.

RIGHT basilar infiltrate is increased since the previous exam.

No pleural effusion or pneumothorax.

Bones demineralized.
IMPRESSION: Bibasilar infiltrates consistent with pneumonia or aspiration, RIGHT
greater than LEFT, slightly increased on RIGHT since prior study

## 2019-07-04 MED ORDER — HYDRALAZINE HCL 20 MG/ML IJ SOLN
10.0000 mg | Freq: Four times a day (QID) | INTRAMUSCULAR | Status: DC | PRN
  Administered 2019-07-04 – 2019-07-11 (×4): 10 mg via INTRAVENOUS
  Filled 2019-07-04 (×5): qty 1

## 2019-07-04 MED ORDER — INSULIN ASPART 100 UNIT/ML ~~LOC~~ SOLN
0.0000 [IU] | SUBCUTANEOUS | Status: DC
  Administered 2019-07-04 (×2): 3 [IU] via SUBCUTANEOUS
  Administered 2019-07-05 – 2019-07-06 (×3): 2 [IU] via SUBCUTANEOUS
  Administered 2019-07-06 (×3): 3 [IU] via SUBCUTANEOUS
  Administered 2019-07-06: 2 [IU] via SUBCUTANEOUS
  Administered 2019-07-06: 5 [IU] via SUBCUTANEOUS
  Administered 2019-07-07 (×2): 2 [IU] via SUBCUTANEOUS
  Administered 2019-07-07 (×3): 3 [IU] via SUBCUTANEOUS
  Administered 2019-07-08: 2 [IU] via SUBCUTANEOUS
  Administered 2019-07-08: 5 [IU] via SUBCUTANEOUS
  Administered 2019-07-08 (×2): 3 [IU] via SUBCUTANEOUS
  Administered 2019-07-08: 2 [IU] via SUBCUTANEOUS
  Administered 2019-07-08: 3 [IU] via SUBCUTANEOUS
  Administered 2019-07-09: 5 [IU] via SUBCUTANEOUS
  Administered 2019-07-09: 11 [IU] via SUBCUTANEOUS

## 2019-07-04 MED ORDER — PROPOFOL 1000 MG/100ML IV EMUL
5.0000 ug/kg/min | INTRAVENOUS | Status: DC
  Administered 2019-07-04: 15 ug/kg/min via INTRAVENOUS
  Administered 2019-07-05: 30 ug/kg/min via INTRAVENOUS
  Administered 2019-07-05: 40 ug/kg/min via INTRAVENOUS
  Administered 2019-07-06: 30 ug/kg/min via INTRAVENOUS
  Filled 2019-07-04 (×4): qty 100

## 2019-07-04 NOTE — Progress Notes (Signed)
Progress Note  Patient Name: Seth Boyd Date of Encounter: 07/04/2019  Primary Cardiologist:   Evelynne Spiers   Subjective   85 year old gentleman who was admitted with strokelike symptoms.  He was found to have atrial fibrillation.  Surprisingly, the MRI did not reveal any evidence of acute stroke. He still on the vent.  Inpatient Medications    Scheduled Meds: . aspirin  81 mg Per Tube Daily  . atorvastatin  40 mg Per Tube QHS  . chlorhexidine  15 mL Mouth Rinse BID  . chlorhexidine gluconate (MEDLINE KIT)  15 mL Mouth Rinse BID  . Chlorhexidine Gluconate Cloth  6 each Topical Q0600  . insulin aspart  0-15 Units Subcutaneous Q4H  . mouth rinse  15 mL Mouth Rinse 10 times per day  . pantoprazole (PROTONIX) IV  40 mg Intravenous Q24H  . vitamin B-12  2,000 mcg Per Tube Daily   Continuous Infusions: . ampicillin-sulbactam (UNASYN) IV 3 g (07/04/19 0500)  . dexmedetomidine (PRECEDEX) IV infusion Stopped (07/03/19 0403)  . diltiazem (CARDIZEM) infusion    . feeding supplement (VITAL AF 1.2 CAL)    . heparin 800 Units/hr (07/04/19 0800)  . lactated ringers 50 mL/hr at 07/03/19 0700  . levETIRAcetam Stopped (07/03/19 2325)   PRN Meds: acetaminophen, acetaminophen, fentaNYL (SUBLIMAZE) injection, hydrALAZINE, midazolam   Vital Signs    Vitals:   07/04/19 0941 07/04/19 1000 07/04/19 1030 07/04/19 1057  BP: (!) 197/97 (!) 186/90 (!) 174/75   Pulse:  86 (!) 110   Resp:  19 15   Temp:      TempSrc:      SpO2:  100% 97% 97%  Weight:      Height:        Intake/Output Summary (Last 24 hours) at 07/04/2019 1113 Last data filed at 07/04/2019 0800 Gross per 24 hour  Intake 993.05 ml  Output 1075 ml  Net -81.95 ml   Last 3 Weights 07/03/2019 07/02/2019 07/01/2019  Weight (lbs) 144 lb 6.4 oz 137 lb 12.6 oz 137 lb 12.6 oz  Weight (kg) 65.5 kg 62.5 kg 62.5 kg      Telemetry    Coarse atrial fib / atrial flutter with controlled V response - Personally Reviewed  ECG        Physical Exam   GEN:  Elderly gentleman, no acute distress Neck: No JVD Cardiac: RRR, soft systolic murmur Respiratory: Clear to auscultation bilaterally. GI: Soft, nontender, non-distended  MS: No edema; No deformity. Neuro:  Nonfocal  Psych: Normal affect   Labs    High Sensitivity Troponin:  No results for input(s): TROPONINIHS in the last 720 hours.    Chemistry Recent Labs  Lab 06/04/2019 1127 06/28/2019 1130 06/04/2019 2244 06/20/2019 2245 07/01/19 0511 07/01/19 0511 07/02/19 0933 07/02/19 0933 07/03/19 0353 07/03/19 0422 07/04/19 0346  NA 135   < > 138   < > 137   < > 143   < > 143 142 145  K 3.8   < > 2.7*   < > 3.3*   < > 2.9*   < > 3.3* 3.3* 4.2  CL 95*   < > 103   < > 102   < > 100  --  105  --  107  CO2 25   < > 24   < > 22   < > 28  --  27  --  27  GLUCOSE 227*   < > 144*   < > 112*   < >  158*  --  169*  --  198*  BUN 14   < > 12   < > 14   < > 17  --  23  --  30*  CREATININE 0.78   < > 0.65   < > 0.66   < > 0.90  --  0.73  --  0.79  CALCIUM 9.5   < > 8.9   < > 8.7*   < > 9.0  --  8.8*  --  9.1  PROT 8.0  --  6.5  --   --   --   --   --   --   --   --   ALBUMIN 4.2  --  3.4*  --  2.7*  --   --   --   --   --   --   AST 75*  --  79*  --   --   --   --   --   --   --   --   ALT 43  --  42  --   --   --   --   --   --   --   --   ALKPHOS 100  --  86  --   --   --   --   --   --   --   --   BILITOT 0.6  --  1.2  --   --   --   --   --   --   --   --   GFRNONAA >60   < > >60   < > >60   < > >60  --  >60  --  >60  GFRAA >60   < > >60   < > >60   < > >60  --  >60  --  >60  ANIONGAP 15   < > 11   < > 13   < > 15  --  11  --  11   < > = values in this interval not displayed.     Hematology Recent Labs  Lab 07/01/19 0511 07/01/19 0511 07/03/19 0353 07/03/19 0422 07/04/19 0346  WBC 9.2  --  7.3  --  9.3  RBC 4.00*  --  3.80*  --  3.91*  HGB 11.7*   < > 11.2* 10.2* 11.4*  HCT 35.8*   < > 34.1* 30.0* 35.3*  MCV 89.5  --  89.7  --  90.3  MCH 29.3  --  29.5  --   29.2  MCHC 32.7  --  32.8  --  32.3  RDW 12.5  --  12.4  --  12.5  PLT 183  --  213  --  251   < > = values in this interval not displayed.    BNP Recent Labs  Lab 06/09/2019 1610  BNP 745.7*     DDimer No results for input(s): DDIMER in the last 168 hours.   Radiology    DG Chest Port 1 View  Result Date: 07/04/2019 CLINICAL DATA:  Respiratory failure, intubation, stroke EXAM: PORTABLE CHEST 1 VIEW COMPARISON:  Portable exam 0603 hours compared to 07/03/2019 FINDINGS: Tip of endotracheal tube projects 6.6 cm above carina. External pacing leads project over chest. Normal heart size, mediastinal contours, and pulmonary vascularity. Atherosclerotic calcification aorta. Bibasilar infiltrates which could represent pneumonia or aspiration. RIGHT basilar infiltrate is increased since the previous exam. No pleural effusion or  pneumothorax. Bones demineralized. IMPRESSION: Bibasilar infiltrates consistent with pneumonia or aspiration, RIGHT greater than LEFT, slightly increased on RIGHT since prior study Electronically Signed   By: Lavonia Dana M.D.   On: 07/04/2019 08:32   Portable Chest xray  Result Date: 07/03/2019 CLINICAL DATA:  Respiratory failure, check endotracheal tube placement EXAM: PORTABLE CHEST 1 VIEW COMPARISON:  07/01/2019 FINDINGS: Cardiac shadow is within normal limits. Endotracheal tube is noted in satisfactory position. Mild aortic calcifications are seen. Lungs are well aerated bilaterally. Persistent right infrahilar opacity is noted. No acute bony abnormality is noted. Postsurgical changes in the cervical spine are noted. IMPRESSION: Stable right basilar density. Endotracheal tube in satisfactory position. Electronically Signed   By: Inez Catalina M.D.   On: 07/03/2019 03:28    Cardiac Studies     Patient Profile     85 y.o. male with new onset atrial fibrillation/atrial flutter.  Assessment & Plan    Atrial fibrillation/atrial flutter: The patient has new onset atrial  fibrillation/atrial flutter.  He is on heparin .   He was admitted with stroke like symptoms but surprisingly the MRI did not show any evidence of stroke. Echocardiogram was done this morning. HR is very well controlled.  I have Axtell the IV cardizem - he has been bradycardic without the cardizem.   We can restart if needed.   Would transition to Eliquis from heparin at some point.           For questions or updates, please contact [place] Please consult [email or web address] for contact info under        Signed, Mertie Moores, MD  07/04/2019, 11:13 AM

## 2019-07-04 NOTE — Progress Notes (Signed)
NAME:  Seth Boyd, MRN:  [id number], DOB:  [birth year], LOS: 5 ADMISSION DATE:  06/26/2019, CONSULTATION DATE:  06/19/2019 REFERRING MD:  Lorin Mercy, CHIEF COMPLAINT:  Encephalopathy   Brief History   85 y.o. male, goes by Seth Boyd, with prior hx HTN/ DM presenting after being found on floor with acute encephalopathy.  Presented as code stroke.  Noted to have initial left gaze.  LSW 1/30 ~4 pm.  Rule out for stroke with negative CTH/ CTA.  Concern for seizure +/- toxic/ metabolic encephalopathy.    Past Medical History  HTN, DM  Significant Hospital Events   1/31 Admit 2/02 extubated 2/03 difficulty with agitation and airway protection, weak cough 2/04 reintubated, a flutter  Consults:  Neurology Cardiology  Procedures:  1/31 ETT >> 2/02 2/04 ETT >>  Significant Diagnostic Tests:  1/31 CT head/neck >> 30% ICA stenosis on the right, 20% on the left, 30-50% narrowing in both carotid siphon regions but no correctable proximal stenosis. 1/31 EEG >> moderate diffuse encephalopathy, no epileptiform activity 2/01 brain MRI >> Moderate cerebral atrophy with chronic microvascular ischemic disease, Soft tissue edema within the right occipital/suboccipital scalp. 2/01 LTM  >> moderate diffuse encephalopathy 2/05 Echo >>   Micro Data:  1/31 SARS 2/ flu A/B >> neg 1/31 MRSA PCR >> negative  Antimicrobials:  Unasyn 2/02 >>   Interim history/subjective:  Remains on vent.  Objective   Blood pressure (!) 179/74, pulse (!) 57, temperature 97.9 F (36.6 C), temperature source Axillary, resp. rate 13, height 5\' 10"  (1.778 m), weight 65.5 kg, SpO2 100 %.    Vent Mode: PSV;CPAP FiO2 (%):  [40 %] 40 % Set Rate:  [12 bmp] 12 bmp Vt Set:  [580 mL] 580 mL PEEP:  [5 cmH20] 5 cmH20 Pressure Support:  [5 cmH20] 5 cmH20 Plateau Pressure:  [12 cmH20-16 cmH20] 12 cmH20   Intake/Output Summary (Last 24 hours) at 07/04/2019 0850 Last data filed at 07/04/2019 0800 Gross per 24 hour  Intake 993.05 ml   Output 1075 ml  Net -81.95 ml   Filed Weights   07/01/19 0500 07/02/19 0500 07/03/19 0300  Weight: 62.5 kg 62.5 kg 65.5 kg   Examination:  General - somnolent Eyes - pupils reactive ENT - ETT in place Cardiac - irregular Chest - scattered rhonchi Abdomen - soft, non tender, + bowel sounds Extremities - decreased muscle bulk Skin - no rashes Neuro - follows commands  CXR (reviewed by me) - basilar ASD  Resolved Hospital Problem list   Rhabdomyolysis  Assessment & Plan:   Acute hypoxic respiratory failure with compromised airway. - pressure support wean as able - deconditioning is barrier to extubation trial  Aspiration pneumonia. - day 4 of unasyn  Acute metabolic encephalopathy - cause unclear, but initially felt to be from unwitnessed seizure. - continue AEDs - RASS goal 0 to -1  New onset Atrial flutter. Episodes of bradycardia. HTN, HLD. - cardiology consulted - f/u Echo - continue ASA, lipitor when able - prn IV hydralazine for SBP > 170 - continue heparin gtt  Dysphagia with difficulty placing OG/NG tube. Severe protein calorie malnutrition. - place cortrak >> if unable to, then would need to be done in radiology with fluoro  DM type 2 poorly controlled. - SSI  B12 deficiency.  - B12 supplementation initiated 2/1  Hx of GERD. - continue protonix   Best practice:  Diet: tube feeds DVT prophylaxis: heparin gtt GI prophylaxis: PPI  Glucose control: monitor Mobility: bed Code Status: full Disposition:  ICU  Labs    CMP Latest Ref Rng & Units 07/04/2019 07/03/2019 07/03/2019  Glucose 70 - 99 mg/dL 630(Z) - 601(U)  BUN 8 - 23 mg/dL 93(A) - 23  Creatinine 0.61 - 1.24 mg/dL 3.55 - 7.32  Sodium 202 - 145 mmol/L 145 142 143  Potassium 3.5 - 5.1 mmol/L 4.2 3.3(L) 3.3(L)  Chloride 98 - 111 mmol/L 107 - 105  CO2 22 - 32 mmol/L 27 - 27  Calcium 8.9 - 10.3 mg/dL 9.1 - 8.8(L)  Total Protein 6.5 - 8.1 g/dL - - -  Total Bilirubin 0.3 - 1.2 mg/dL - - -   Alkaline Phos 38 - 126 U/L - - -  AST 15 - 41 U/L - - -  ALT 0 - 44 U/L - - -    CBC Latest Ref Rng & Units 07/04/2019 07/03/2019 07/03/2019  WBC 4.0 - 10.5 K/uL 9.3 - 7.3  Hemoglobin 13.0 - 17.0 g/dL 11.4(L) 10.2(L) 11.2(L)  Hematocrit 39.0 - 52.0 % 35.3(L) 30.0(L) 34.1(L)  Platelets 150 - 400 K/uL 251 - 213    ABG    Component Value Date/Time   PHART 7.444 07/03/2019 0422   PCO2ART 40.7 07/03/2019 0422   PO2ART 71.0 (L) 07/03/2019 0422   HCO3 28.0 07/03/2019 0422   TCO2 29 07/03/2019 0422   O2SAT 95.0 07/03/2019 0422    CBG (last 3)  Recent Labs    07/03/19 0808  GLUCAP 165*    CC time 31 minutes  Coralyn Helling, MD [facility] Pulmonary/Critical Care 07/04/2019, 8:50 AM

## 2019-07-04 NOTE — Progress Notes (Signed)
eLink Physician-Brief Progress Note Patient Name: Seth Boyd DOB: [birth year] MRN: [id number]   Date of Service  07/04/2019  HPI/Events of Note  Oliguria - Unable to void. Bladder scan with 400 mL residual. Already I/o cathed several times last 24 hours.   eICU Interventions  Will order: 1. Place Foley catheter.     Intervention Category Major Interventions: Other:  Cristi Gwynn Dennard Nip 07/04/2019, 1:22 AM

## 2019-07-04 NOTE — Progress Notes (Signed)
  Echocardiogram 2D Echocardiogram has been performed.  Seth Boyd 07/04/2019, 12:11 PM

## 2019-07-04 NOTE — Progress Notes (Signed)
ANTICOAGULATION CONSULT NOTE   Pharmacy Consult for heparin Indication: atrial fibrillation   Assessment: 53 yoM admitted as code stroke with negative imaging. Pt developed AFib 2/4 during respiratory arrest requiring intubation. Pharmacy asked to begin IV heparin. CHADSVASc = 4, no AC PTA. Will target normal heparin level goal given negative stroke workup.  Heparin level now therapeutic at 0.44 after rate decrease. CBC stable. No active bleed issues reported.   Goal of Therapy:  Heparin level 0.3-0.7 units/ml Monitor platelets by anticoagulation protocol: Yes   Plan:  Continue heparin at 800 units/hr Monitor daily heparin level and CBC, s/sx bleeding   Babs Bertin, PharmD, BCPS Please check AMION for all [facility] contact numbers Clinical Pharmacist 07/04/2019 1:57 PM

## 2019-07-04 NOTE — Progress Notes (Signed)
Pt back on full vent support due to decreased RR.

## 2019-07-04 NOTE — Progress Notes (Signed)
Spoke with pt's son.  Updated about current status and treatment plan.  Coralyn Helling, MD [facility] Pulmonary/Critical Care 07/04/2019, 11:59 AM

## 2019-07-04 NOTE — Progress Notes (Signed)
eLink Physician-Brief Progress Note Patient Name: Seth Boyd DOB: 1934-09-22 MRN: [id number]   Date of Service  07/04/2019  HPI/Events of Note  Notified of patient agitation despite versed and fentanyl pushes and foley leaking despite being replaced requesting urology consult  eICU Interventions   Patient seen in bed sideways. Ordered to start propofol for sedation  Urology maybe consulted in am on the discretion of bedside MD     Intervention Category Minor Interventions: Agitation / anxiety - evaluation and management  Darl Pikes 07/04/2019, 11:27 PM

## 2019-07-04 NOTE — Procedures (Signed)
Cortrak  Person Inserting Tube:  Lisl Slingerland, RD Tube Type:  Cortrak - 43 inches Tube Location:  Right nare Initial Placement:  Stomach Secured by: Bridle Technique Used to Measure Tube Placement:  Documented cm marking at nare/ corner of mouth Cortrak Secured At:  73 cm   No x-ray is required. RN may begin using tube.    If the tube becomes dislodged please keep the tube and contact the Cortrak team at [email or web address] ([email or web address]) for replacement.  If after hours and replacement cannot be delayed, place a NG tube and confirm placement with an abdominal x-ray.    Malakhi Markwood RD, LDN Clinical Nutrition Pager listed in AMION    

## 2019-07-04 NOTE — Progress Notes (Signed)
ANTICOAGULATION CONSULT NOTE   Pharmacy Consult for heparin Indication: atrial fibrillation   Assessment: 29 yoM admitted as code stroke with negative imaging. Pt developed AFib this morning during respiratory arrest requiring intubation. Pharmacy asked to begin IV heparin. CHADSVASc = 4, no AC PTA, CBC wnl this am. Pt received SQ heparin for VTE ppx ~1330. Will target normal heparin level goal given negative stroke workup. Initial heparin level 1.06 units/ml   Goal of Therapy:  Heparin level 0.3-0.7 units/ml Monitor platelets by anticoagulation protocol: Yes   Plan:  -Decrease heparin 800 units/hr -Check heparin level in 6-8 hours -Monitor heparin level, CBC, S/Sx bleeding daily  Thanks for allowing pharmacy to be a part of this patient's care.  Talbert Cage, PharmD Clinical Pharmacist  07/04/2019

## 2019-07-05 ENCOUNTER — Inpatient Hospital Stay (HOSPITAL_COMMUNITY): Payer: Medicare HMO

## 2019-07-05 ENCOUNTER — Inpatient Hospital Stay: Payer: Self-pay

## 2019-07-05 DIAGNOSIS — R410 Disorientation, unspecified: Secondary | ICD-10-CM

## 2019-07-05 LAB — CBC
HCT: 35.4 % — ABNORMAL LOW (ref 39.0–52.0)
Hemoglobin: 11.3 g/dL — ABNORMAL LOW (ref 13.0–17.0)
MCH: 29 pg (ref 26.0–34.0)
MCHC: 31.9 g/dL (ref 30.0–36.0)
MCV: 90.8 fL (ref 80.0–100.0)
Platelets: 233 10*3/uL (ref 150–400)
RBC: 3.9 MIL/uL — ABNORMAL LOW (ref 4.22–5.81)
RDW: 12.6 % (ref 11.5–15.5)
WBC: 8.5 10*3/uL (ref 4.0–10.5)
nRBC: 0 % (ref 0.0–0.2)

## 2019-07-05 LAB — GLUCOSE, CAPILLARY
Glucose-Capillary: 106 mg/dL — ABNORMAL HIGH (ref 70–99)
Glucose-Capillary: 110 mg/dL — ABNORMAL HIGH (ref 70–99)
Glucose-Capillary: 119 mg/dL — ABNORMAL HIGH (ref 70–99)
Glucose-Capillary: 123 mg/dL — ABNORMAL HIGH (ref 70–99)
Glucose-Capillary: 127 mg/dL — ABNORMAL HIGH (ref 70–99)
Glucose-Capillary: 91 mg/dL (ref 70–99)

## 2019-07-05 LAB — BASIC METABOLIC PANEL
Anion gap: 15 (ref 5–15)
BUN: 20 mg/dL (ref 8–23)
CO2: 25 mmol/L (ref 22–32)
Calcium: 8.9 mg/dL (ref 8.9–10.3)
Chloride: 106 mmol/L (ref 98–111)
Creatinine, Ser: 0.61 mg/dL (ref 0.61–1.24)
GFR calc Af Amer: >60 mL/min (ref >60)
GFR calc non Af Amer: >60 mL/min (ref >60)
Glucose, Bld: 123 mg/dL — ABNORMAL HIGH (ref 70–99)
Potassium: 3.5 mmol/L (ref 3.5–5.1)
Sodium: 146 mmol/L — ABNORMAL HIGH (ref 135–145)

## 2019-07-05 LAB — HEPARIN LEVEL (UNFRACTIONATED): Heparin Unfractionated: 0.46 IU/mL (ref 0.30–0.70)

## 2019-07-05 LAB — TRIGLYCERIDES: Triglycerides: 114 mg/dL (ref <150)

## 2019-07-05 IMAGING — RF DG NASO G TUBE PLC W/FL W/RAD
1 series · 1 of 1 positions shown · IV contrast (omnipaque)
Comparison: None.

CLINICAL DATA: 84-year-old with an occluded feeding tube which
cannot be declotted at bedside. Ventilator dependent respiratory
failure. Urgent feeding tube replacement is requested as the patient
has not had enteral nutrition for several days.

EXAM:
FEEDING TUBE PLACEMENT WITH FLUOROSCOPIC GUIDANCE
CONTRAST:  6 mL Omnipaque 350 via the feeding tube.
FLUOROSCOPY TIME:  Fluoroscopy Time:  3 minutes 18 seconds
Number of Acquired Spot Images: 0

[Series 11: cp_standard · 0.17mm/px · 1 of 1 slices shown]
[im 1/1]
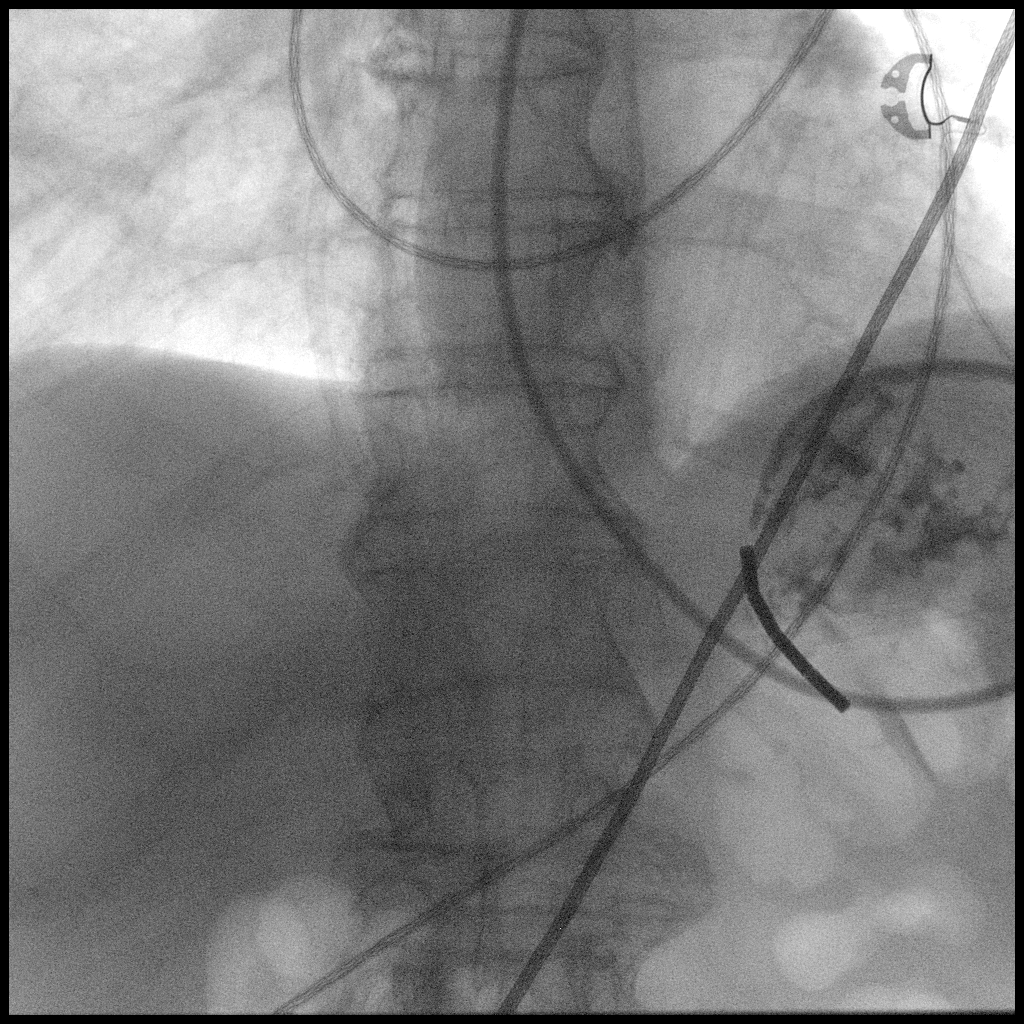

[1 of 1 positions shown; findings below may reference images not displayed]

FINDINGS: Under fluoroscopic guidance with the a of a hydrophilic guidewire, a
feeding tube was placed through the RIGHT naris. I was ultimately
able to advance the tube into the stomach. Its location within the
stomach was confirmed with the contrast injection.
IMPRESSION: Successful placement of feeding tube under fluoroscopic guidance,
with the tip of the catheter in the stomach. The catheter is ready
for immediate use.

## 2019-07-05 MED ORDER — LIDOCAINE VISCOUS HCL 2 % MT SOLN
OROMUCOSAL | Status: AC
  Filled 2019-07-05: qty 15

## 2019-07-05 NOTE — Progress Notes (Signed)
At bedside to place TL PICC for TNA, propofol and intermittent meds.  Victorino Dike RN states the doctor wants another attempt at placing the coretrac for feedings. If TNA not needed, a Double Lumen would suffice.  Victorino Dike states the PIV's are working well at this time and will notify via IV Team consult if double lumen or triple lumen needed.  Also aware placement could be 07/06/19 due to delay for coretrac.  Consent placed on chart.

## 2019-07-05 NOTE — Progress Notes (Signed)
Changed vent setting back to [facility] due to Apnea after RN gave sedation.

## 2019-07-05 NOTE — Progress Notes (Signed)
NAME:  Seth Boyd, MRN:  [id number], DOB:  05/28/1935, LOS: 6 ADMISSION DATE:  18-Jul-2019, CONSULTATION DATE:  07-18-19 REFERRING MD:  Ophelia Charter, CHIEF COMPLAINT:  Encephalopathy   Brief History   85 y.o. male, goes by Seth Boyd, with prior hx HTN/ DM presenting after being found on floor with acute encephalopathy.  Presented as code stroke.  Noted to have initial left gaze.  LSW 1/30 ~4 pm.  Rule out for stroke with negative CTH/ CTA.  Concern for seizure +/- toxic/ metabolic encephalopathy.    Past Medical History  HTN, DM  Significant Hospital Events   1/31 Admit 2/02 extubated 2/03 difficulty with agitation and airway protection, weak cough 2/04 reintubated early am, a flutter New onset a fib/flutter - on heparin 2/5 issues with foley leaking  Started back on propofol  2/6 I/O 1.997/1.3  Consults:  Neurology Cardiology  Procedures:  1/31 ETT >> 2/02 2/04 ETT >>  Significant Diagnostic Tests:  1/31 CT head/neck >> 30% ICA stenosis on the right, 20% on the left, 30-50% narrowing in both carotid siphon regions but no correctable proximal stenosis. 1/31 EEG >> moderate diffuse encephalopathy, no epileptiform activity 2/01 brain MRI >> Moderate cerebral atrophy with chronic microvascular ischemic disease, Soft tissue edema within the right occipital/suboccipital scalp. 2/01 LTM  >> moderate diffuse encephalopathy 2/05 Echo >>   cxr 07/04/19 IMPRESSION: Bibasilar infiltrates consistent with pneumonia or aspiration, RIGHT greater than LEFT, slightly increased on RIGHT since prior study Micro Data:  1/31 SARS 2/ flu A/B >> neg 1/31 MRSA PCR >> negative  Antimicrobials:  Unasyn 2/02 >>  Heparin started 2/4  Interim history/subjective:  Remains on vent.  Objective   Blood pressure (!) 170/74, pulse 76, temperature 98.2 F (36.8 C), temperature source Axillary, resp. rate 16, height 5\' 10"  (1.778 m), weight 65.5 kg, SpO2 100 %.    Vent Mode: CPAP;PSV FiO2 (%):  [40 %] 40  % Set Rate:  [12 bmp] 12 bmp Vt Set:  [580 mL] 580 mL PEEP:  [5 cmH20] 5 cmH20 Pressure Support:  [5 cmH20] 5 cmH20 Plateau Pressure:  [13 cmH20-16 cmH20] 16 cmH20   Intake/Output Summary (Last 24 hours) at 07/05/2019 1050 Last data filed at 07/05/2019 0600 Gross per 24 hour  Intake 1733.61 ml  Output 950 ml  Net 783.61 ml   Filed Weights   07/01/19 0500 07/02/19 0500 07/03/19 0300  Weight: 62.5 kg 62.5 kg 65.5 kg   Examination:  General - somnolent Eyes - pupils reactive ENT - ETT in place Cardiac - irregular Chest - scattered rhonchi Abdomen - soft, non tender, + bowel sounds Extremities - decreased muscle bulk Skin - no rashes Neuro - follows commands  CXR (reviewed by me) - basilar ASD  Resolved Hospital Problem list   Rhabdomyolysis  Assessment & Plan:   Acute hypoxic respiratory failure with compromised airway. - pressure support wean as able - deconditioning is barrier to extubation trial  Aspiration pneumonia. - day 5 of unasyn  Acute metabolic encephalopathy - cause unclear, but initially felt to be from unwitnessed seizure. - continue AEDs - RASS goal 0 to -1 Consider asking neurology to see again given recurrent episode of somnolence despite nl CO2 (2/4 early am)  keppra prpofol Hydralazine  New onset Atrial flutter. Episodes of bradycardia. HTN, HLD. - cardiology consulted - f/u Echo - continue ASA, lipitor when able - prn IV hydralazine for SBP > 170 - continue heparin gtt  Dysphagia with difficulty placing OG/NG tube. Severe protein calorie  malnutrition. - Cortrak clogged.  Request IR for OG placement.   DM type 2 poorly controlled. - SSI  B12 deficiency.  - B12 supplementation initiated 2/1  Hx of GERD. - continue protonix   Best practice:  Diet: tube feeds DVT prophylaxis: heparin gtt GI prophylaxis: PPI  Glucose control: monitor Mobility: bed Code Status: full Disposition: ICU  Labs    CMP Latest Ref Rng & Units  07/05/2019 07/04/2019 07/03/2019  Glucose 70 - 99 mg/dL 123(H) 198(H) -  BUN 8 - 23 mg/dL 20 30(H) -  Creatinine 0.61 - 1.24 mg/dL 0.61 0.79 -  Sodium 135 - 145 mmol/L 146(H) 145 142  Potassium 3.5 - 5.1 mmol/L 3.5 4.2 3.3(L)  Chloride 98 - 111 mmol/L 106 107 -  CO2 22 - 32 mmol/L 25 27 -  Calcium 8.9 - 10.3 mg/dL 8.9 9.1 -  Total Protein 6.5 - 8.1 g/dL - - -  Total Bilirubin 0.3 - 1.2 mg/dL - - -  Alkaline Phos 38 - 126 U/L - - -  AST 15 - 41 U/L - - -  ALT 0 - 44 U/L - - -    CBC Latest Ref Rng & Units 07/05/2019 07/04/2019 07/03/2019  WBC 4.0 - 10.5 K/uL 8.5 9.3 -  Hemoglobin 13.0 - 17.0 g/dL 11.3(L) 11.4(L) 10.2(L)  Hematocrit 39.0 - 52.0 % 35.4(L) 35.3(L) 30.0(L)  Platelets 150 - 400 K/uL 233 251 -    ABG    Component Value Date/Time   PHART 7.444 07/03/2019 0422   PCO2ART 40.7 07/03/2019 0422   PO2ART 71.0 (L) 07/03/2019 0422   HCO3 28.0 07/03/2019 0422   TCO2 29 07/03/2019 0422   O2SAT 95.0 07/03/2019 0422    CBG (last 3)  Recent Labs    07/04/19 2331 07/05/19 0344 07/05/19 0824  GLUCAP 113* 119* 127*    CC time 31 minutes  Chesley Mires, MD [place] 07/05/2019, 10:50 AM

## 2019-07-05 NOTE — Progress Notes (Signed)
Patient's foley that was placed for urinary retention leaking. Foley was removed and new foley was placed. New foley also leaked. ELink notified for possible urology consult or Coude catheter. No new orders at this time.   Patient cortrak also noted to be clogged immediately after starting TF. This RN stopped the TF and attempted to flush the tube multiple times with sterile water. Elink was also notified of this. No new orders at this time.

## 2019-07-05 NOTE — Progress Notes (Signed)
ANTICOAGULATION CONSULT NOTE   Pharmacy Consult for heparin Indication: atrial fibrillation   Assessment: 7 yoM admitted as code stroke with negative imaging. Pt developed AFib 2/4 during respiratory arrest requiring intubation. Pharmacy asked to begin IV heparin. CHADSVASc = 4, no AC PTA. Will target normal heparin level goal given negative stroke workup.  Heparin level within goal  Cbc stable  Goal of Therapy:  Heparin level 0.3-0.7 units/ml Monitor platelets by anticoagulation protocol: Yes   Plan:  Continue heparin at 800 units/hr Monitor daily heparin level and CBC, s/sx bleeding  Seth Boyd, PharmD, BCPS, BCCCP Clinical Pharmacist [phone]  Please check AMION for all [facility] Pharmacy numbers  07/05/2019 8:26 AM

## 2019-07-06 ENCOUNTER — Inpatient Hospital Stay (HOSPITAL_COMMUNITY): Payer: Medicare HMO

## 2019-07-06 LAB — CBC
HCT: 31.8 % — ABNORMAL LOW (ref 39.0–52.0)
Hemoglobin: 10.2 g/dL — ABNORMAL LOW (ref 13.0–17.0)
MCH: 28.9 pg (ref 26.0–34.0)
MCHC: 32.1 g/dL (ref 30.0–36.0)
MCV: 90.1 fL (ref 80.0–100.0)
Platelets: 242 10*3/uL (ref 150–400)
RBC: 3.53 MIL/uL — ABNORMAL LOW (ref 4.22–5.81)
RDW: 12.7 % (ref 11.5–15.5)
WBC: 5.7 10*3/uL (ref 4.0–10.5)
nRBC: 0 % (ref 0.0–0.2)

## 2019-07-06 LAB — GLUCOSE, CAPILLARY
Glucose-Capillary: 139 mg/dL — ABNORMAL HIGH (ref 70–99)
Glucose-Capillary: 142 mg/dL — ABNORMAL HIGH (ref 70–99)
Glucose-Capillary: 155 mg/dL — ABNORMAL HIGH (ref 70–99)
Glucose-Capillary: 169 mg/dL — ABNORMAL HIGH (ref 70–99)
Glucose-Capillary: 179 mg/dL — ABNORMAL HIGH (ref 70–99)
Glucose-Capillary: 205 mg/dL — ABNORMAL HIGH (ref 70–99)

## 2019-07-06 LAB — HEPARIN LEVEL (UNFRACTIONATED): Heparin Unfractionated: 0.4 IU/mL (ref 0.30–0.70)

## 2019-07-06 IMAGING — DX DG CHEST 1V PORT
1 series · 1 of 1 positions shown · non-contrast
Comparison: [DATE] and prior exams

CLINICAL DATA: Acute respiratory failure with hypoxia.

EXAM:
PORTABLE CHEST 1 VIEW

[chest ap]
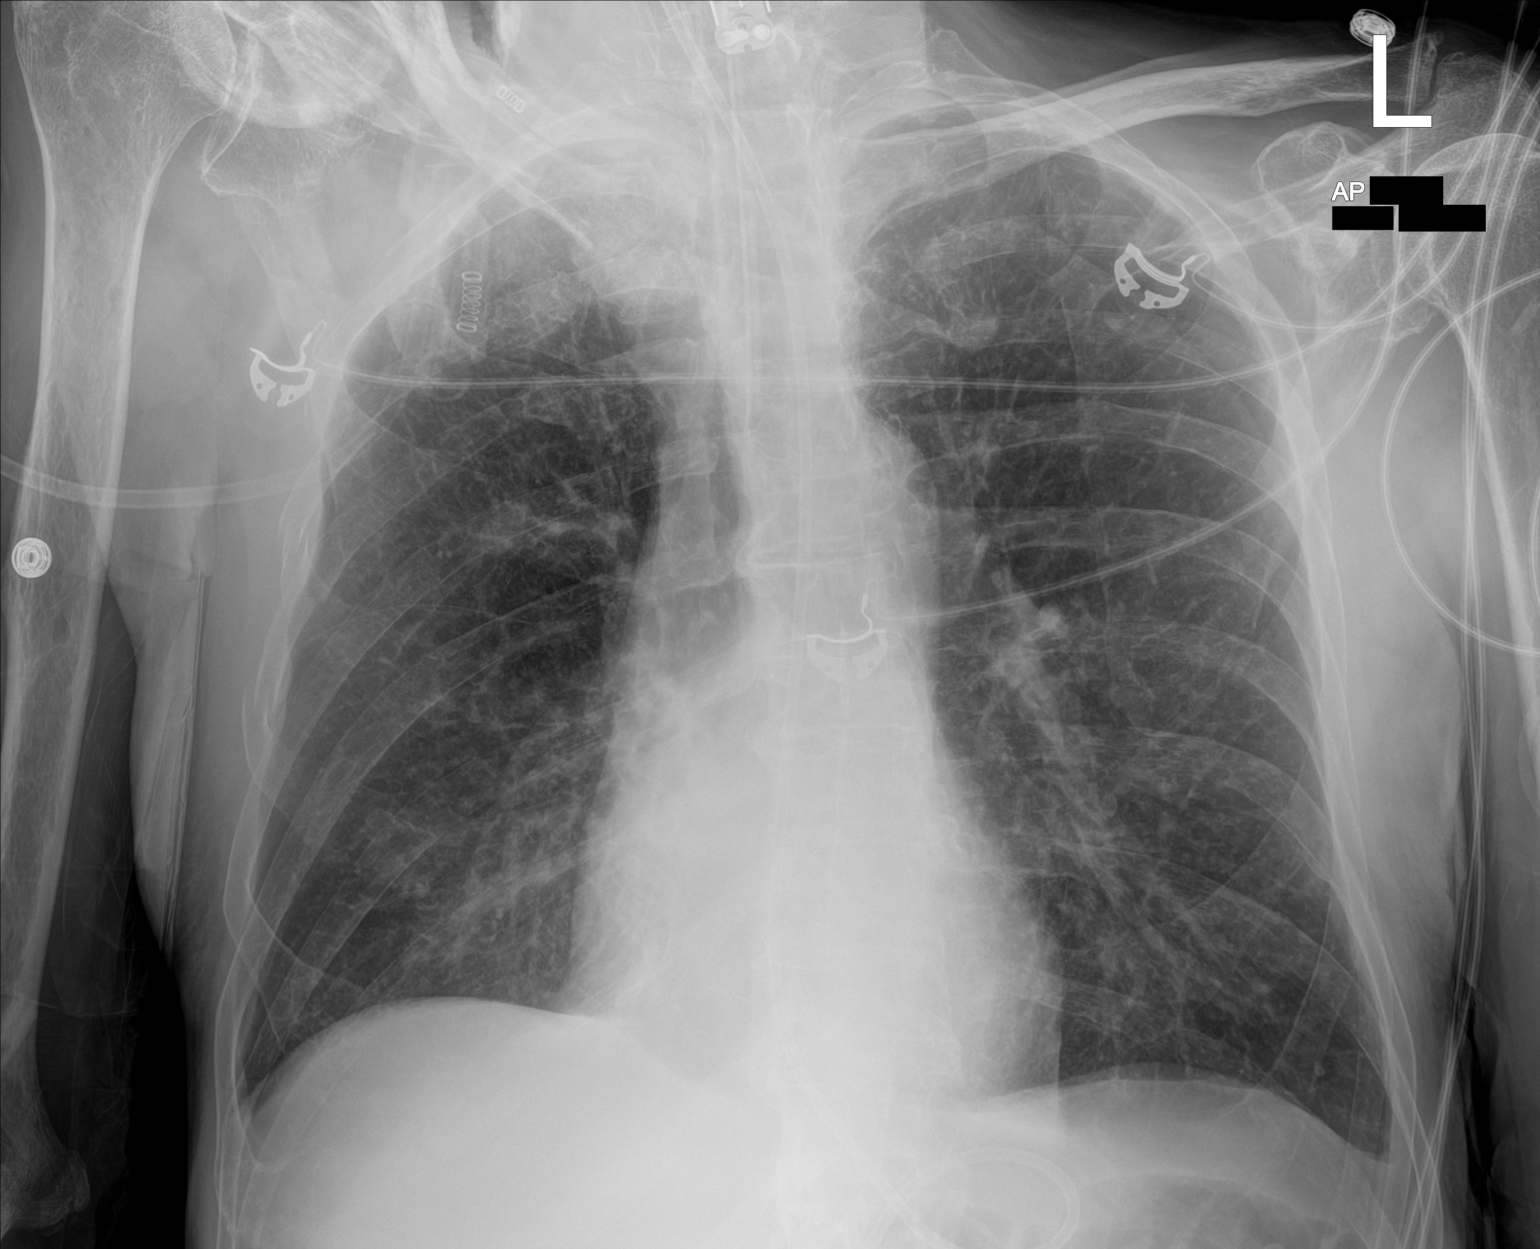

[1 of 1 positions shown; findings below may reference images not displayed]

FINDINGS: The cardiomediastinal silhouette is unremarkable.

An endotracheal tube with tip 4 cm above the carina is noted.

A small bore feeding tube enters the stomach with tip off the field
of view.

Improved bibasilar opacities/atelectasis noted.

No pneumothorax or large pleural effusion.
IMPRESSION: Improved bibasilar opacities/atelectasis.

Feeding tube placement.

## 2019-07-06 NOTE — Progress Notes (Signed)
NAME:  Seth Boyd, MRN:  [id number], DOB:  24-Dec-1934, LOS: 7 ADMISSION DATE:  07/21/19, CONSULTATION DATE:  July 21, 2019 REFERRING MD:  Ophelia Charter, CHIEF COMPLAINT:  Encephalopathy   Brief History   85 y.o. male, goes by Seth Boyd, with prior hx HTN/ DM presenting after being found on floor with acute encephalopathy.  Presented as code stroke.  Noted to have initial left gaze.  LSW 1/30 ~4 pm.  Rule out for stroke with negative CTH/ CTA.  Concern for seizure +/- toxic/ metabolic encephalopathy.    Past Medical History  HTN, DM  Significant Hospital Events   1/31 Admit 2/02 extubated 2/03 difficulty with agitation and airway protection, weak cough 2/04 reintubated early am, a flutter New onset a fib/flutter - on heparin 2/5 issues with foley leaking  Started back on propofol  2/6 stable Apneic with PSV  2/7:    Consults:  Neurology Cardiology  Procedures:  1/31 ETT >> 2/02 2/04 ETT >>  Significant Diagnostic Tests:  1/31 CT head/neck >> 30% ICA stenosis on the right, 20% on the left, 30-50% narrowing in both carotid siphon regions but no correctable proximal stenosis. 1/31 EEG >> moderate diffuse encephalopathy, no epileptiform activity 2/01 brain MRI >> Moderate cerebral atrophy with chronic microvascular ischemic disease, Soft tissue edema within the right occipital/suboccipital scalp. 2/01 LTM  >> moderate diffuse encephalopathy 2/05 Echo >>   cxr 07/04/19 IMPRESSION: Bibasilar infiltrates consistent with pneumonia or aspiration, RIGHT greater than LEFT, slightly increased on RIGHT since prior study Micro Data:  1/31 SARS 2/ flu A/B >> neg 1/31 MRSA PCR >> negative  Antimicrobials:  Unasyn 2/02 >>  Heparin started 2/4  Interim history/subjective:  Remains on vent.  Objective   Blood pressure 130/60, pulse 64, temperature (!) 97.2 F (36.2 C), temperature source Axillary, resp. rate 10, height 5\' 10"  (1.778 m), weight 65.5 kg, SpO2 100 %.    Vent Mode: PSV FiO2  (%):  [30 %-40 %] 30 % Set Rate:  [12 bmp] 12 bmp Vt Set:  [580 mL] 580 mL PEEP:  [5 cmH20] 5 cmH20 Pressure Support:  [8 cmH20] 8 cmH20 Plateau Pressure:  [12 cmH20-16 cmH20] 16 cmH20   Intake/Output Summary (Last 24 hours) at 07/06/2019 0944 Last data filed at 07/06/2019 0900 Gross per 24 hour  Intake 2607.34 ml  Output 800 ml  Net 1807.34 ml   Filed Weights   07/01/19 0500 07/02/19 0500 07/03/19 0300  Weight: 62.5 kg 62.5 kg 65.5 kg   Examination:  General - lethargic, arousable, follows simple commands Eyes - pupils reactive ENT - ETT in place Cardiac - irregular Chest - scattered rhonchi Abdomen - soft, non tender, + bowel sounds Extremities - decreased muscle bulk Skin - no rashes Neuro - follows commands  CXR  IMPRESSION: Improved bibasilar opacities/atelectasis.  Resolved Hospital Problem list   Rhabdomyolysis  Assessment & Plan:   Acute hypoxic respiratory failure with compromised airway. - pressure support wean as able - deconditioning is barrier to extubation trial Already failed extubation once, due to hypoxia and recurrent unexplained episodes of somnolence. Also appeared to have difficulty protecting airway, very sonorous sounding breaths even when alert.     Aspiration pneumonia. - day 5 of unasyn  Acute metabolic encephalopathy - cause unclear, but initially felt to be from unwitnessed seizure. - continue AEDs - RASS goal 0 to -1 Consider asking neurology to see again given recurrent episode of somnolence despite nl CO2 (2/4 early am). Their impression was this was due to intermittent  seizures.  No ongoing evidence of seizures now.  Will hold propofol and see how he does.   New onset Atrial flutter. Episodes of bradycardia. HTN, HLD. - cardiology consulted - f/u Echo - continue ASA, lipitor when able - prn IV hydralazine for SBP > 170 - continue heparin gtt  Dysphagia with difficulty placing OG/NG tube. Severe protein calorie malnutrition. -  Cortrak replaced yesterday.  DM type 2 poorly controlled. - SSI  B12 deficiency.  - B12 supplementation initiated 2/1  Hx of GERD. - continue protonix   Best practice:  Diet: tube feeds DVT prophylaxis: heparin gtt GI prophylaxis: PPI  Glucose control: monitor Mobility: bed Code Status: full Disposition: ICU  Labs    CMP Latest Ref Rng & Units 07/05/2019 07/04/2019 07/03/2019  Glucose 70 - 99 mg/dL 123(H) 198(H) -  BUN 8 - 23 mg/dL 20 30(H) -  Creatinine 0.61 - 1.24 mg/dL 0.61 0.79 -  Sodium 135 - 145 mmol/L 146(H) 145 142  Potassium 3.5 - 5.1 mmol/L 3.5 4.2 3.3(L)  Chloride 98 - 111 mmol/L 106 107 -  CO2 22 - 32 mmol/L 25 27 -  Calcium 8.9 - 10.3 mg/dL 8.9 9.1 -  Total Protein 6.5 - 8.1 g/dL - - -  Total Bilirubin 0.3 - 1.2 mg/dL - - -  Alkaline Phos 38 - 126 U/L - - -  AST 15 - 41 U/L - - -  ALT 0 - 44 U/L - - -    CBC Latest Ref Rng & Units 07/06/2019 07/05/2019 07/04/2019  WBC 4.0 - 10.5 K/uL 5.7 8.5 9.3  Hemoglobin 13.0 - 17.0 g/dL 10.2(L) 11.3(L) 11.4(L)  Hematocrit 39.0 - 52.0 % 31.8(L) 35.4(L) 35.3(L)  Platelets 150 - 400 K/uL 242 233 251    ABG    Component Value Date/Time   PHART 7.444 07/03/2019 0422   PCO2ART 40.7 07/03/2019 0422   PO2ART 71.0 (L) 07/03/2019 0422   HCO3 28.0 07/03/2019 0422   TCO2 29 07/03/2019 0422   O2SAT 95.0 07/03/2019 0422    CBG (last 3)  Recent Labs    07/05/19 2332 07/06/19 0330 07/06/19 0815  GLUCAP 110* 142* 139*     Total critical care time 45 minutes.  Collier Bullock

## 2019-07-06 NOTE — Progress Notes (Addendum)
Spoke with Morrie Sheldon, RN c/o PICC. IR was able to place a gastric tube for tube feeding. Purpose of PICC was for potentially TPN. Patient has two PIVs working well. Morrie Sheldon, RN to confirm with primary if PICC still needed.   Follow up with Morrie Sheldon, RN results in a cancel of the PICC order. Order to be replaced if needed.

## 2019-07-06 NOTE — Progress Notes (Signed)
Received in report that the Cortrak tube was placed by IR, yesterday, 2/6. Tube is marked at 76cm at the Right nare. Pt tolerating tube feed with active bowel sounds.

## 2019-07-06 NOTE — Progress Notes (Signed)
ANTICOAGULATION CONSULT NOTE   Pharmacy Consult for heparin Indication: atrial fibrillation   Assessment: 48 yoM admitted as code stroke with negative imaging. Pt developed AFib 2/4 during respiratory arrest requiring intubation. CHADSVASc = 4, no AC PTA. Will target normal heparin level goal given negative stroke workup.  Heparin level within goal  Cbc stable  Goal of Therapy:  Heparin level 0.3-0.7 units/ml Monitor platelets by anticoagulation protocol: Yes   Plan:  Continue heparin at 800 units/hr Monitor daily heparin level and CBC, s/sx bleeding  Seth Boyd, PharmD, BCPS, BCCCP Clinical Pharmacist [phone]  Please check AMION for all [facility] numbers  07/06/2019 9:16 AM

## 2019-07-07 DIAGNOSIS — G9340 Encephalopathy, unspecified: Secondary | ICD-10-CM

## 2019-07-07 DIAGNOSIS — I483 Typical atrial flutter: Secondary | ICD-10-CM

## 2019-07-07 DIAGNOSIS — J9600 Acute respiratory failure, unspecified whether with hypoxia or hypercapnia: Secondary | ICD-10-CM

## 2019-07-07 LAB — GLUCOSE, CAPILLARY
Glucose-Capillary: 131 mg/dL — ABNORMAL HIGH (ref 70–99)
Glucose-Capillary: 136 mg/dL — ABNORMAL HIGH (ref 70–99)
Glucose-Capillary: 156 mg/dL — ABNORMAL HIGH (ref 70–99)
Glucose-Capillary: 169 mg/dL — ABNORMAL HIGH (ref 70–99)
Glucose-Capillary: 183 mg/dL — ABNORMAL HIGH (ref 70–99)
Glucose-Capillary: 186 mg/dL — ABNORMAL HIGH (ref 70–99)

## 2019-07-07 LAB — CBC
HCT: 29.7 % — ABNORMAL LOW (ref 39.0–52.0)
Hemoglobin: 9.6 g/dL — ABNORMAL LOW (ref 13.0–17.0)
MCH: 29.3 pg (ref 26.0–34.0)
MCHC: 32.3 g/dL (ref 30.0–36.0)
MCV: 90.5 fL (ref 80.0–100.0)
Platelets: 224 10*3/uL (ref 150–400)
RBC: 3.28 MIL/uL — ABNORMAL LOW (ref 4.22–5.81)
RDW: 12.6 % (ref 11.5–15.5)
WBC: 6.7 10*3/uL (ref 4.0–10.5)
nRBC: 0.3 % — ABNORMAL HIGH (ref 0.0–0.2)

## 2019-07-07 LAB — HEPARIN LEVEL (UNFRACTIONATED): Heparin Unfractionated: 0.32 IU/mL (ref 0.30–0.70)

## 2019-07-07 MED ORDER — SODIUM CHLORIDE 0.9 % IV SOLN
INTRAVENOUS | Status: DC | PRN
  Administered 2019-07-07 – 2019-07-08 (×2): 250 mL via INTRAVENOUS
  Administered 2019-07-09: 500 mL via INTRAVENOUS

## 2019-07-07 MED ORDER — ORAL CARE MOUTH RINSE
15.0000 mL | Freq: Two times a day (BID) | OROMUCOSAL | Status: DC
  Administered 2019-07-08 (×2): 15 mL via OROMUCOSAL

## 2019-07-07 MED ORDER — FUROSEMIDE 10 MG/ML IJ SOLN
40.0000 mg | Freq: Once | INTRAMUSCULAR | Status: AC
  Administered 2019-07-07: 40 mg via INTRAVENOUS
  Filled 2019-07-07: qty 4

## 2019-07-07 MED ORDER — PRO-STAT SUGAR FREE PO LIQD
30.0000 mL | Freq: Every day | ORAL | Status: DC
  Filled 2019-07-07: qty 30

## 2019-07-07 MED ORDER — CHLORHEXIDINE GLUCONATE 0.12 % MT SOLN
15.0000 mL | Freq: Two times a day (BID) | OROMUCOSAL | Status: DC
  Administered 2019-07-07 – 2019-07-08 (×2): 15 mL via OROMUCOSAL

## 2019-07-07 MED ORDER — OSMOLITE 1.2 CAL PO LIQD
1000.0000 mL | ORAL | Status: DC
  Administered 2019-07-07 – 2019-07-08 (×2): 1000 mL
  Filled 2019-07-07 (×5): qty 1000

## 2019-07-07 MED ORDER — CHLORHEXIDINE GLUCONATE CLOTH 2 % EX PADS
6.0000 | MEDICATED_PAD | Freq: Every day | CUTANEOUS | Status: DC
  Administered 2019-07-07 – 2019-07-16 (×8): 6 via TOPICAL

## 2019-07-07 MED ORDER — PRO-STAT SUGAR FREE PO LIQD
30.0000 mL | Freq: Every day | ORAL | Status: DC
  Administered 2019-07-09: 30 mL
  Filled 2019-07-07: qty 30

## 2019-07-07 NOTE — Evaluation (Signed)
Physical Therapy Re-Evaluation Patient Details Name: Seth Boyd MRN: [id number] DOB: August 14, 1934 Today's Date: 07/07/2019   History of Present Illness  pt is an 85 y/o male with pmh significant for DM, HTN, admitted after found down on the floor by his son, making snoring sounds, eyes open and unable to talk.  At hospital there was some concern of pt's ability to protect his airway.  Imaging showing no acute neurological abnormality. Patient intubated 1/31-07/01/19 then re-intubated 2/4-07/07/19 with new onset a-fib.  Clinical Impression  Patient presents with mobility limited due to decreased strength, decreased activity tolerance, decreased balance and decreased cognition.  Currently mod A of 2 for OOB.  Previously he lived alone.  Prior to re-intubation was able to stand a little easier.  Feel he will benefit from skilled PT in the acute setting and follow up CIR level rehab if patient able to have 24 hour support in the home after rehab.  If not may need to consider STSNF.      Follow Up Recommendations CIR;Supervision/Assistance - 24 hour(depending on available 24 hour assist at d/c.)    Equipment Recommendations  Other (comment)(TBA)    Recommendations for Other Services       Precautions / Restrictions Precautions Precautions: Fall      Mobility  Bed Mobility Overal bed mobility: Needs Assistance Bed Mobility: Supine to Sit     Supine to sit: HOB elevated;Min assist     General bed mobility comments: increased time but able to scoot to EOB with assist for safety wtih lines/balance  Transfers Overall transfer level: Needs assistance Equipment used: None;Rolling walker (2 wheeled) Transfers: Scientist, clinical (histocompatibility and immunogenetics) Transfers;Sit to/from Stand Sit to Stand: Mod assist   Squat pivot transfers: Mod assist;+2 safety/equipment     General transfer comment: pivot to recliner on L with cues, lifting help and assist to get hips to chair, stood from recliner to RW with mod lifting help as  well  Ambulation/Gait Ambulation/Gait assistance: Min assist;+2 safety/equipment Gait Distance (Feet): 1 Feet Assistive device: Rolling walker (2 wheeled)       General Gait Details: stood to march in place few steps with RW  Stairs            Wheelchair Mobility    Modified Rankin (Stroke Patients Only)       Balance Overall balance assessment: Needs assistance   Sitting balance-Leahy Scale: Fair     Standing balance support: Bilateral upper extremity supported Standing balance-Leahy Scale: Poor Standing balance comment: UE support and assist for balance                             Pertinent Vitals/Pain Faces Pain Scale: Hurts a little bit Pain Location: generalized Pain Descriptors / Indicators: Aching;Grimacing;Guarding Pain Intervention(s): Monitored during session;Repositioned    Home Living Family/patient expects to be discharged to:: Private residence Living Arrangements: Alone Available Help at Discharge: Family;Available PRN/intermittently Type of Home: House Home Access: Stairs to enter Entrance Stairs-Rails: Right;Left Entrance Stairs-Number of Steps: 4 Home Layout: Two level;Able to live on main level with bedroom/bathroom;Other (Comment);Laundry or work area in basement        Prior Function Level of Independence: Independent               Higher education careers adviser        Extremity/Trunk Assessment   Upper Extremity Assessment Upper Extremity Assessment: Defer to OT evaluation    Lower Extremity Assessment Lower Extremity Assessment: Generalized weakness(some  arthritic changes in knees)    Cervical / Trunk Assessment Cervical / Trunk Assessment: Kyphotic  Communication   Communication: HOH  Cognition Arousal/Alertness: Awake/alert Behavior During Therapy: Restless Overall Cognitive Status: Impaired/Different from baseline Area of Impairment: Attention;Safety/judgement;Following commands;Problem solving;Orientation                  Orientation Level: Situation;Time Current Attention Level: Sustained Memory: Decreased recall of precautions;Decreased short-term memory Following Commands: Follows one step commands with increased time;Follows one step commands inconsistently Safety/Judgement: Decreased awareness of deficits;Decreased awareness of safety   Problem Solving: Slow processing;Requires verbal cues General Comments: also limited due to hearing deficits      General Comments General comments (skin integrity, edema, etc.): on supplemental O2 after extubation, but OOB to chair on RA with spO2 100-98% but replaced O2 once in chair    Exercises     Assessment/Plan    PT Assessment Patient needs continued PT services  PT Problem List Decreased strength;Decreased activity tolerance;Decreased mobility;Decreased cognition;Decreased safety awareness;Decreased knowledge of precautions;Decreased knowledge of use of DME;Decreased balance       PT Treatment Interventions Gait training;Functional mobility training;Therapeutic activities;Balance training;Patient/family education;DME instruction;Therapeutic exercise    PT Goals (Current goals can be found in the Care Plan section)  Acute Rehab PT Goals Patient Stated Goal: none stated PT Goal Formulation: Patient unable to participate in goal setting Time For Goal Achievement: 07/21/19 Potential to Achieve Goals: Fair    Frequency Min 3X/week   Barriers to discharge        Co-evaluation PT/OT/SLP Co-Evaluation/Treatment: Yes Reason for Co-Treatment: Complexity of the patient's impairments (multi-system involvement);For patient/therapist safety;To address functional/ADL transfers           AM-PAC PT "6 Clicks" Mobility  Outcome Measure Help needed turning from your back to your side while in a flat bed without using bedrails?: A Little Help needed moving from lying on your back to sitting on the side of a flat bed without using  bedrails?: A Lot Help needed moving to and from a bed to a chair (including a wheelchair)?: A Lot Help needed standing up from a chair using your arms (e.g., wheelchair or bedside chair)?: A Lot Help needed to walk in hospital room?: A Lot Help needed climbing 3-5 steps with a railing? : Total 6 Click Score: 12    End of Session   Activity Tolerance: Patient limited by fatigue Patient left: in chair;with call bell/phone within reach;with chair alarm set   PT Visit Diagnosis: Other abnormalities of gait and mobility (R26.89);Other symptoms and signs involving the nervous system (R29.898);Muscle weakness (generalized) (M62.81)    Time: 9735-3299 PT Time Calculation (min) (ACUTE ONLY): 33 min   Charges:   PT Evaluation $PT Re-evaluation: 1 Re-eval          Magda Kiel, PT Acute Rehabilitation Services [phone] 07/07/2019   Reginia Naas 07/07/2019, 1:33 PM

## 2019-07-07 NOTE — Evaluation (Signed)
Occupational Therapy Evaluation Patient Details Name: Seth Boyd MRN: [id number] DOB: March 20, 1935 Today's Date: 07/07/2019    History of Present Illness pt is an 85 y/o male PMHx: DM, HTN, admitted after found down on the floor by his son, making snoring sounds, eyes open and unable to talk. Stroke work-up- Imaging showing no acute neurological abnormality. Patient intubated 1/31-07/01/19 then re-intubated 2/4-07/07/19 with new onset a-fib.   Clinical Impression   Pt PTA: Pt evaluation 2/5, but orders discontinued due to re-intubation. Pt with worsening status since last eval for mobility. Pt limited by [facility], inability to follow most commands, lack of awareness of deficit or safety and decreased ability to care for self. Pt performing light grooming with minA overall and maxA overall for ADL. Pt modA +1-2 for mobility; transfers with ModA +2 for squat pivot as pt unable to clear bed with bottom. Pt would benefit from continued OT skilled services for ADL, mobility and safety in CIR setting. OT following acutely.     Follow Up Recommendations  CIR    Equipment Recommendations  Other (comment)(tbd)    Recommendations for Other Services       Precautions / Restrictions Precautions Precautions: Fall;Other (comment) Precaution Comments: 3L O2 Restrictions Weight Bearing Restrictions: No      Mobility Bed Mobility Overal bed mobility: Needs Assistance Bed Mobility: Supine to Sit     Supine to sit: HOB elevated;Min assist     General bed mobility comments: increased time but able to scoot to EOB with assist for safety wtih lines/balance  Transfers Overall transfer level: Needs assistance Equipment used: None;Rolling walker (2 wheeled) Transfers: Set designer Transfers;Sit to/from Stand Sit to Stand: Mod assist;+2 physical assistance;+2 safety/equipment   Squat pivot transfers: Mod assist;+2 safety/equipment     General transfer comment: pivot; pt unable to stand fully so pt  guided to recliner with minimal bottom clearance.    Balance Overall balance assessment: Needs assistance   Sitting balance-Leahy Scale: Fair     Standing balance support: Bilateral upper extremity supported Standing balance-Leahy Scale: Poor Standing balance comment: UE support and assist for balance                           ADL either performed or assessed with clinical judgement   ADL Overall ADL's : Needs assistance/impaired Eating/Feeding: NPO   Grooming: Min guard;Sitting Grooming Details (indicate cue type and reason): sitting upright in recliner with occasional assist for yonker to suction mouth Upper Body Bathing: Moderate assistance;Sitting;Cueing for safety   Lower Body Bathing: Maximal assistance;+2 for physical assistance;Sitting/lateral leans;Sit to/from stand;Cueing for safety;Cueing for sequencing   Upper Body Dressing : Moderate assistance;Sitting;Cueing for safety   Lower Body Dressing: Maximal assistance;+2 for physical assistance;Sitting/lateral leans;Sit to/from stand;Cueing for safety   Toilet Transfer: Moderate assistance;+2 for physical assistance;Stand-pivot;BSC;RW   Toileting- Clothing Manipulation and Hygiene: Maximal assistance;+2 for physical assistance;+2 for safety/equipment;Cueing for safety;Sitting/lateral lean;Sit to/from stand       Functional mobility during ADLs: Maximal assistance;+2 for physical assistance;+2 for safety/equipment;Cueing for safety;Cueing for sequencing;Rolling walker General ADL Comments: Pt limited by [facility], inability to follow all commands, lack of awareness of deficit or safety and decreased ability to care for self.     Vision Baseline Vision/History: No visual deficits Additional Comments: pt unable to follow commands well enough; pt able to scan room and follow OT throughout     Perception     Praxis      Pertinent Vitals/Pain Pain  Assessment: No/denies pain Faces Pain Scale: Hurts a little  bit Pain Location: generalized Pain Descriptors / Indicators: Aching;Grimacing;Guarding Pain Intervention(s): Monitored during session     Hand Dominance Right   Extremity/Trunk Assessment Upper Extremity Assessment Upper Extremity Assessment: Generalized weakness   Lower Extremity Assessment Lower Extremity Assessment: Generalized weakness   Cervical / Trunk Assessment Cervical / Trunk Assessment: Kyphotic   Communication Communication Communication: HOH   Cognition Arousal/Alertness: Awake/alert Behavior During Therapy: WFL for tasks assessed/performed Overall Cognitive Status: Impaired/Different from baseline Area of Impairment: Attention;Safety/judgement;Following commands;Problem solving;Orientation                 Orientation Level: Situation;Time Current Attention Level: Sustained Memory: Decreased recall of precautions;Decreased short-term memory Following Commands: Follows one step commands with increased time;Follows one step commands inconsistently Safety/Judgement: Decreased awareness of deficits;Decreased awareness of safety   Problem Solving: Slow processing;Requires verbal cues General Comments: also limited due to hearing deficits   General Comments  O2 on RA with exertion >98% on RA; O2 replaced on 3L [place] >98% O2.    Exercises     Shoulder Instructions      Home Living Family/patient expects to be discharged to:: Private residence Living Arrangements: Alone Available Help at Discharge: Family;Available PRN/intermittently Type of Home: House Home Access: Stairs to enter [employer] of Steps: 4 Entrance Stairs-Rails: Right;Left Home Layout: Two level;Able to live on main level with bedroom/bathroom;Other (Comment);Laundry or work area in basement     [employer]: Producer, television/film/video: Handicapped height         Additional Comments: Info taken from previous OT eval on 2/3      Prior  Functioning/Environment Level of Independence: Independent                 OT Problem List: Decreased strength;Decreased activity tolerance;Impaired balance (sitting and/or standing);Decreased safety awareness;Decreased cognition;Decreased coordination;Impaired UE functional use;Pain      OT Treatment/Interventions: Self-care/ADL training;Therapeutic exercise;Energy conservation;Manual therapy    OT Goals(Current goals can be found in the care plan section) Acute Rehab OT Goals Patient Stated Goal: none stated OT Goal Formulation: Patient unable to participate in goal setting Time For Goal Achievement: 07/21/19 Potential to Achieve Goals: Good  OT Frequency: Min 2X/week   Barriers to D/C:            Co-evaluation   Reason for Co-Treatment: Complexity of the patient's impairments (multi-system involvement);For patient/therapist safety;To address functional/ADL transfers          AM-PAC OT "6 Clicks" Daily Activity     Outcome Measure Help from another person eating meals?: Total Help from another person taking care of personal grooming?: A Lot(NPO with cortrak) Help from another person toileting, which includes using toliet, bedpan, or urinal?: Total Help from another person bathing (including washing, rinsing, drying)?: Total Help from another person to put on and taking off regular upper body clothing?: A Lot Help from another person to put on and taking off regular lower body clothing?: Total 6 Click Score: 8   End of Session Equipment Utilized During Treatment: Rolling walker Nurse Communication: Mobility status  Activity Tolerance: Patient tolerated treatment well Patient left: in chair;with call bell/phone within reach;with chair alarm set  OT Visit Diagnosis: Muscle weakness (generalized) (M62.81);Cognitive communication deficit (R41.841);Unsteadiness on feet (R26.81) Symptoms and signs involving cognitive functions: Other cerebrovascular disease                 Time: 9735-3299 OT Time Calculation (min): 31 min  Charges:  OT General Charges $OT Visit: 1 Visit OT Evaluation $OT Eval Moderate Complexity: 1 Mod  Flora Lipps OTR/L Acute Rehabilitation Services Pager: [phone] Office: [phone]   Dacota Ruben C 07/07/2019, 3:01 PM

## 2019-07-07 NOTE — Progress Notes (Signed)
NAME:  Seth Boyd, MRN:  [id number], DOB:  Apr 20, 1935, LOS: 8 ADMISSION DATE:  06/18/2019, CONSULTATION DATE:  06/20/2019 REFERRING MD:  Ophelia Charter, CHIEF COMPLAINT:  Encephalopathy   Brief History   85 y.o. male, goes by [name], with prior hx HTN/ DM presenting after being found on floor with acute encephalopathy.  Presented as code stroke.  Noted to have initial left gaze.  LSW 1/30 ~4 pm.  Rule out for stroke with negative CTH/ CTA.  Concern for seizure +/- toxic/ metabolic encephalopathy.    Past Medical History  HTN, DM  Significant Hospital Events   1/31 Admit 2/02 extubated 2/03 difficulty with agitation and airway protection, weak cough 2/04 reintubated early am, a flutter New onset a fib/flutter - on heparin 2/5 issues with foley leaking  Started back on propofol  2/6 stable Apneic with PSV  2/7:    Consults:  Neurology Cardiology  Procedures:  1/31 ETT >> 2/02 2/04 ETT >>  Significant Diagnostic Tests:  1/31 CT head/neck >> 30% ICA stenosis on the right, 20% on the left, 30-50% narrowing in both carotid siphon regions but no correctable proximal stenosis. 1/31 EEG >> moderate diffuse encephalopathy, no epileptiform activity 2/01 brain MRI >> Moderate cerebral atrophy with chronic microvascular ischemic disease, Soft tissue edema within the right occipital/suboccipital scalp. 2/01 LTM  >> moderate diffuse encephalopathy 2/05 Echo >>   cxr 07/04/19 IMPRESSION: Bibasilar infiltrates consistent with pneumonia or aspiration, RIGHT greater than LEFT, slightly increased on RIGHT since prior study Micro Data:  1/31 SARS 2/ flu A/B >> neg 1/31 MRSA PCR >> negative 2/8 resp >>  Antimicrobials:  Unasyn 2/02 >>  Heparin started 2/4  Interim history/subjective:   Remains critically ill, intubated Afebrile Awake off sedation    Objective   Blood pressure (!) 141/75, pulse 73, temperature 98.3 F (36.8 C), temperature source Axillary, resp. rate 19, height 5\' 10"   (1.778 m), weight 70.3 kg, SpO2 100 %.    Vent Mode: PSV;CPAP FiO2 (%):  [30 %-40 %] 30 % Set Rate:  [12 bmp] 12 bmp Vt Set:  [580 mL] 580 mL PEEP:  [5 cmH20] 5 cmH20 Pressure Support:  [8 cmH20] 8 cmH20 Plateau Pressure:  [15 cmH20] 15 cmH20   Intake/Output Summary (Last 24 hours) at 07/07/2019 1023 Last data filed at 07/07/2019 0800 Gross per 24 hour  Intake 3295.04 ml  Output 450 ml  Net 2845.04 ml   Filed Weights   07/02/19 0500 07/03/19 0300 07/07/19 0354  Weight: 62.5 kg 65.5 kg 70.3 kg   Examination:  General -elderly man, intubated,  Eyes - pupils reactive, mild pallor, no icterus ENT -oral ETT in place Cardiac -S1-S2 regular Chest -bilateral ventilated breath sounds Abdomen - soft, non tender, + bowel sounds Extremities - decreased muscle bulk Skin - no rashes Neuro -alert, interactive, moving all 4 extremities, trying to mouth words  Labs show no leukocytosis  Resolved Hospital Problem list   Rhabdomyolysis  Assessment & Plan:   Acute hypoxic respiratory failure with compromised airway. - Failed extubation once, due to hypoxia and recurrent unexplained episodes of somnolence.  -He is tolerating pressure support 5/5 with excellent tidal volume and has much improved mental status, will proceed with extubation  Aspiration pneumonia -completed 5 days of Unasyn -We will obtain respiratory culture and discontinue antibiotics  Acute metabolic encephalopathy - cause unclear, but initially felt to be from unwitnessed seizure. - continue keppra  -Outpatient neurology follow-up per their last recommendation 2/2 -If recurrent episode, will need  urology input again  New onset Atrial flutter 2/4-spontaneous converted to sinus rhythm Episodes of bradycardia. HTN, HLD. - cardiology following - continue ASA, lipitor when able - prn IV hydralazine for SBP > 170 - continue heparin gtt >> transition to oral Eliquis soon  Dysphagia with difficulty placing OG/NG  tube. Severe protein calorie malnutrition. - Cortrak replaced  -We will hold tube feeds for 4 hours post extubation -We will need swallow evaluation eventually  DM type 2 poorly controlled. - SSI  B12 deficiency.  - B12 supplementation initiated 2/1  Hx of GERD. - continue protonix   Best practice:  Diet: tube feeds DVT prophylaxis: heparin gtt GI prophylaxis: PPI  Glucose control: monitor Mobility: bed Code Status: full Disposition: ICU  The patient is critically ill with multiple organ systems failure and requires high complexity decision making for assessment and support, frequent evaluation and titration of therapies, application of advanced monitoring technologies and extensive interpretation of multiple databases. Critical Care Time devoted to patient care services described in this note independent of APP/resident  time is 35 minutes.    Kara Mead MD. [facility]. [facility]  If no response to pager , please call [phone]   07/07/2019

## 2019-07-07 NOTE — Progress Notes (Signed)
Nutrition Follow-up  DOCUMENTATION CODES:   Severe malnutrition in context of chronic illness  INTERVENTION:   Initiate Osmolite 1.2 @ 60 ml/hr via small bore feeding tube (post pyloric) 30 ml Prostat daily  Provides: 1828 kcal, 94 grams protein, and 1167 ml free water.    NUTRITION DIAGNOSIS:   Severe Malnutrition related to (likely chronic illness) as evidenced by severe fat depletion, severe muscle depletion. Ongoing  GOAL:   Patient will meet greater than or equal to 90% of their needs Met.   MONITOR:   I & O's, TF tolerance  REASON FOR ASSESSMENT:   Ventilator    ASSESSMENT:   Pt with PMH of HTN and DM admitted with acute encephalopathy.    Pt discussed during ICU rounds and with RN.   2/5 cortrak placed but seemed to be clogged 2/6 small bore feeding tube placed by IR 2/8 extubated  Medications reviewed  Labs reviewed: CBG's: [phone]    Diet Order:   Diet Order            Diet NPO time specified  Diet effective now              EDUCATION NEEDS:   No education needs have been identified at this time  Skin:  Skin Assessment: Reviewed RN Assessment  Last BM:  unknown  Height:   Ht Readings from Last 1 Encounters:  06/24/2019 5' 10"  (1.778 m)    Weight:   Wt Readings from Last 1 Encounters:  07/07/19 70.3 kg    Ideal Body Weight:  75.4 kg  BMI:  Body mass index is 22.24 kg/m.  Estimated Nutritional Needs:   Kcal:  1750-2000  Protein:  90-120 grams  Fluid:  2 L/day  Maylon Peppers RD, LDN, CNSC [phone] Pager [phone] After Hours Pager

## 2019-07-07 NOTE — Progress Notes (Signed)
eLink Physician-Brief Progress Note Patient Name: Seth Boyd DOB: 1934-09-23 MRN: [id number]   Date of Service  07/07/2019  HPI/Events of Note  RN called with patient continues with restlessness, A/O x 2 intermittently. Trying to pull out feeding tube. Requests restraint order for patient safety .  Camera care patient pt restless in bed, VSS .   eICU Interventions  Posey belt order given. Was on precedex last pm -tolerated . May try this again. Avoid oversedation as extubated today .  D/c IV Fentanyl .      Intervention Category Minor Interventions: Agitation / anxiety - evaluation and management  [name] 07/07/2019, 7:51 PM

## 2019-07-07 NOTE — Progress Notes (Signed)
ANTICOAGULATION CONSULT NOTE   Pharmacy Consult for heparin Indication: atrial fibrillation   Assessment: 30 yoM admitted as code stroke with negative imaging. Pt developed AFib 2/4 during respiratory arrest requiring intubation. CHADSVASc = 4, no AC PTA. Will target normal heparin level goal given negative stroke workup.  Heparin level remains within goal. Cbc stable. No active bleed issues reported.  Goal of Therapy:  Heparin level 0.3-0.7 units/ml Monitor platelets by anticoagulation protocol: Yes   Plan:  Continue heparin at 800 units/hr Monitor daily heparin level and CBC, s/sx bleeding   Babs Bertin, PharmD, BCPS Please check AMION for all [facility] contact numbers Clinical Pharmacist 07/07/2019 11:56 AM

## 2019-07-07 NOTE — Progress Notes (Addendum)
The patient has been seen in conjunction with Rosaria Ferries, PA-C. All aspects of care have been considered and discussed. The patient has been personally interviewed, examined, and all clinical data has been reviewed.   He is now extubated.  Unable to engage in conversation.  He is sitting in a chair.  Lungs are clear.  He is in sinus rhythm.  Chads Vasc is greater than 3.  Agree with recommendation for anticoagulation if felt safe.   Progress Note  Patient Name: Seth Boyd Date of Encounter: 07/07/2019  Primary Cardiologist:   Nahser   Subjective   Awake on the vent, can nod or shake head to questions, no CP  Inpatient Medications    Scheduled Meds: . aspirin  81 mg Per Tube Daily  . atorvastatin  40 mg Per Tube QHS  . chlorhexidine  15 mL Mouth Rinse BID  . chlorhexidine gluconate (MEDLINE KIT)  15 mL Mouth Rinse BID  . Chlorhexidine Gluconate Cloth  6 each Topical Q0600  . insulin aspart  0-15 Units Subcutaneous Q4H  . mouth rinse  15 mL Mouth Rinse 10 times per day  . pantoprazole (PROTONIX) IV  40 mg Intravenous Q24H  . vitamin B-12  2,000 mcg Per Tube Daily   Continuous Infusions: . ampicillin-sulbactam (UNASYN) IV Stopped (07/07/19 0432)  . dexmedetomidine (PRECEDEX) IV infusion 0.2 mcg/kg/hr (07/07/19 0700)  . feeding supplement (VITAL AF 1.2 CAL) 1,000 mL (07/06/19 1921)  . heparin 800 Units/hr (07/07/19 0700)  . lactated ringers 75 mL/hr at 07/07/19 0700  . levETIRAcetam Stopped (07/06/19 2217)  . propofol (DIPRIVAN) infusion Stopped (07/06/19 0908)   PRN Meds: acetaminophen, acetaminophen, fentaNYL (SUBLIMAZE) injection, hydrALAZINE, midazolam   Vital Signs    Vitals:   07/07/19 0500 07/07/19 0600 07/07/19 0700 07/07/19 0739  BP: (!) 108/53 115/61 (!) 105/55   Pulse: 61 (!) 59 (!) 57   Resp: 11 12 12    Temp:      TempSrc:      SpO2: 100% 100% 100% 100%  Weight:      Height:        Intake/Output Summary (Last 24 hours) at 07/07/2019  5277 Last data filed at 07/07/2019 0700 Gross per 24 hour  Intake 3417.66 ml  Output 550 ml  Net 2867.66 ml   Last 3 Weights 07/07/2019 07/03/2019 07/02/2019  Weight (lbs) 154 lb 15.7 oz 144 lb 6.4 oz 137 lb 12.6 oz  Weight (kg) 70.3 kg 65.5 kg 62.5 kg      Telemetry   SR, SBrady 50s, PVCs- Personally Reviewed  ECG     None today  Physical Exam   General: Well developed, well nourished, male in no acute distress Head: Eyes PERRLA, Head normocephalic and atraumatic Lungs: clear anteriorly to auscultation. Heart: HRRR S1 S2, without rub or gallop. Soft murmur. 4/4 extremity pulses are 2+ & equal. No JVD. Abdomen: Bowel sounds are present, abdomen soft and non-tender without masses or  hernias noted. Extremities: No clubbing, cyanosis or trace edema.    Skin:  No rashes or lesions noted. Neuro: Awake and alert on the vent  Labs    High Sensitivity Troponin:  No results for input(s): TROPONINIHS in the last 720 hours.    Chemistry Recent Labs  Lab 07/01/19 0511 07/02/19 0933 07/03/19 0353 07/03/19 0353 07/03/19 0422 07/04/19 0346 07/05/19 0341  NA 137   < > 143   < > 142 145 146*  K 3.3*   < > 3.3*   < >  3.3* 4.2 3.5  CL 102   < > 105  --   --  107 106  CO2 22   < > 27  --   --  27 25  GLUCOSE 112*   < > 169*  --   --  198* 123*  BUN 14   < > 23  --   --  30* 20  CREATININE 0.66   < > 0.73  --   --  0.79 0.61  CALCIUM 8.7*   < > 8.8*  --   --  9.1 8.9  ALBUMIN 2.7*  --   --   --   --   --   --   GFRNONAA >60   < > >60  --   --  >60 >60  GFRAA >60   < > >60  --   --  >60 >60  ANIONGAP 13   < > 11  --   --  11 15   < > = values in this interval not displayed.     Hematology Recent Labs  Lab 07/05/19 0341 07/06/19 0159 07/07/19 0528  WBC 8.5 5.7 6.7  RBC 3.90* 3.53* 3.28*  HGB 11.3* 10.2* 9.6*  HCT 35.4* 31.8* 29.7*  MCV 90.8 90.1 90.5  MCH 29.0 28.9 29.3  MCHC 31.9 32.1 32.3  RDW 12.6 12.7 12.6  PLT 233 242 224    BNP No results for input(s): BNP,  PROBNP in the last 168 hours.    Radiology    DG CHEST PORT 1 VIEW  Result Date: 07/06/2019 CLINICAL DATA:  Acute respiratory failure with hypoxia. EXAM: PORTABLE CHEST 1 VIEW COMPARISON:  07/04/2019 and prior exams FINDINGS: The cardiomediastinal silhouette is unremarkable. An endotracheal tube with tip 4 cm above the carina is noted. A small bore feeding tube enters the stomach with tip off the field of view. Improved bibasilar opacities/atelectasis noted. No pneumothorax or large pleural effusion. IMPRESSION: Improved bibasilar opacities/atelectasis. Feeding tube placement. Electronically Signed   By: Margarette Canada M.D.   On: 07/06/2019 08:03   DG Loyce Dys Tube Plc W/Fl W/Rad  Result Date: 07/05/2019 CLINICAL DATA:  85 year old with an occluded feeding tube which cannot be declotted at bedside. Ventilator dependent respiratory failure. Urgent feeding tube replacement is requested as the patient has not had enteral nutrition for several days. EXAM: FEEDING TUBE PLACEMENT WITH FLUOROSCOPIC GUIDANCE CONTRAST:  6 mL Omnipaque 350 via the feeding tube. FLUOROSCOPY TIME:  Fluoroscopy Time:  3 minutes 18 seconds Number of Acquired Spot Images: 0 COMPARISON:  None. FINDINGS: Under fluoroscopic guidance with the a of a hydrophilic guidewire, a feeding tube was placed through the RIGHT naris. I was ultimately able to advance the tube into the stomach. Its location within the stomach was confirmed with the contrast injection. IMPRESSION: Successful placement of feeding tube under fluoroscopic guidance, with the tip of the catheter in the stomach. The catheter is ready for immediate use. Electronically Signed   By: Evangeline Dakin M.D.   On: 07/05/2019 18:56   [place] EKG SITE RITE  Result Date: 07/05/2019 If Site Rite image not attached, placement could not be confirmed due to current cardiac rhythm.   Cardiac Studies   ECHO: 07/04/2019 1. Left ventricular ejection fraction, by visual estimation, is 65 to  70%.  The left ventricle has hyperdynamic function. There is no left  ventricular hypertrophy.  2. Left ventricular diastolic parameters are indeterminate.  3. Global right ventricle has normal systolic function.The right  ventricular size is normal.  4. Left atrial size was normal.  5. Right atrial size was normal.  6. The mitral valve is normal in structure. No evidence of mitral valve  regurgitation.  7. The tricuspid valve is normal in structure. Tricuspid valve  regurgitation is not demonstrated.  8. The aortic valve was not well visualized. Aortic valve regurgitation  is not visualized. No evidence of aortic valve stenosis.  9. The pulmonic valve was not well visualized. Pulmonic valve  regurgitation is not visualized.   Patient Profile     85 y.o. male with hx HTN, DM, admitted 01/31 with stroke-like sx (MRI neg CVA), encephalopathy possibly 2nd Sz, 02/04 had new onset atrial fibrillation/atrial flutter.  Assessment & Plan    1. Atrial fib/flutter: - New dx - spont conversion to SR - both atria normal in size, EF nl w/ no WMA - CHA2DS2-VASc = 4 (age x 2, HTN, DM) - transition to Eliquis once able to take pills  2. Encephalopathy - cause unclear - Neuro has seen, no CVA, felt toxic metabolic encephalopathy vs Sz - failed wean once, trying again - per CCM     For questions or updates, please contact [place] Please consult [email or web address] for contact info under        Signed, Rosaria Ferries, PA-C  07/07/2019, 8:38 AM

## 2019-07-07 NOTE — Procedures (Signed)
Extubation Procedure Note  Patient Details:   Name: Seth Boyd DOB: 06-10-1934 MRN: [id number]   Airway Documentation:    Vent end date: 07/07/19 Vent end time: 1056   Evaluation  O2 sats: stable throughout Complications: No apparent complications Patient did tolerate procedure well. Bilateral Breath Sounds: Clear, Diminished   Yes   Patient extubated per MD order. Positive cuff leak. No stridor noted. Patient on 3LNC tolerating well. Vitals are stable. RN at bedside.  Seth Boyd 07/07/2019, 10:59 AM

## 2019-07-08 ENCOUNTER — Inpatient Hospital Stay (HOSPITAL_COMMUNITY): Payer: Medicare HMO | Admitting: Anesthesiology

## 2019-07-08 ENCOUNTER — Inpatient Hospital Stay (HOSPITAL_COMMUNITY): Payer: Medicare HMO

## 2019-07-08 DIAGNOSIS — J9600 Acute respiratory failure, unspecified whether with hypoxia or hypercapnia: Secondary | ICD-10-CM | POA: Diagnosis present

## 2019-07-08 DIAGNOSIS — I469 Cardiac arrest, cause unspecified: Secondary | ICD-10-CM

## 2019-07-08 DIAGNOSIS — G9340 Encephalopathy, unspecified: Secondary | ICD-10-CM | POA: Diagnosis present

## 2019-07-08 LAB — POCT I-STAT 7, (LYTES, BLD GAS, ICA,H+H)
Acid-Base Excess: 11 mmol/L — ABNORMAL HIGH (ref 0.0–2.0)
Acid-Base Excess: 8 mmol/L — ABNORMAL HIGH (ref 0.0–2.0)
Acid-Base Excess: 7 mmol/L — ABNORMAL HIGH (ref 0.0–2.0)
Bicarbonate: 35.8 mmol/L — ABNORMAL HIGH (ref 20.0–28.0)
Bicarbonate: 31.7 mmol/L — ABNORMAL HIGH (ref 20.0–28.0)
Bicarbonate: 31.6 mmol/L — ABNORMAL HIGH (ref 20.0–28.0)
Calcium, Ion: 1.23 mmol/L (ref 1.15–1.40)
Calcium, Ion: 1.18 mmol/L (ref 1.15–1.40)
Calcium, Ion: 1.21 mmol/L (ref 1.15–1.40)
HCT: 31 % — ABNORMAL LOW (ref 39.0–52.0)
HCT: 30 % — ABNORMAL LOW (ref 39.0–52.0)
HCT: 46 % (ref 39.0–52.0)
Hemoglobin: 10.5 g/dL — ABNORMAL LOW (ref 13.0–17.0)
Hemoglobin: 10.2 g/dL — ABNORMAL LOW (ref 13.0–17.0)
Hemoglobin: 15.6 g/dL (ref 13.0–17.0)
O2 Saturation: 100 %
O2 Saturation: 100 %
O2 Saturation: 99 %
Patient temperature: 96.6
Patient temperature: 97
Patient temperature: 98.5
Potassium: 3.1 mmol/L — ABNORMAL LOW (ref 3.5–5.1)
Potassium: 4.5 mmol/L (ref 3.5–5.1)
Potassium: 3.7 mmol/L (ref 3.5–5.1)
Sodium: 143 mmol/L (ref 135–145)
Sodium: 141 mmol/L (ref 135–145)
Sodium: 144 mmol/L (ref 135–145)
TCO2: 37 mmol/L — ABNORMAL HIGH (ref 22–32)
TCO2: 33 mmol/L — ABNORMAL HIGH (ref 22–32)
TCO2: 33 mmol/L — ABNORMAL HIGH (ref 22–32)
pCO2 arterial: 43.2 mmHg (ref 32.0–48.0)
pCO2 arterial: 38.8 mmHg (ref 32.0–48.0)
pCO2 arterial: 43 mmHg (ref 32.0–48.0)
pH, Arterial: 7.522 — ABNORMAL HIGH (ref 7.350–7.450)
pH, Arterial: 7.517 — ABNORMAL HIGH (ref 7.350–7.450)
pH, Arterial: 7.474 — ABNORMAL HIGH (ref 7.350–7.450)
pO2, Arterial: 211 mmHg — ABNORMAL HIGH (ref 83.0–108.0)
pO2, Arterial: 212 mmHg — ABNORMAL HIGH (ref 83.0–108.0)
pO2, Arterial: 131 mmHg — ABNORMAL HIGH (ref 83.0–108.0)

## 2019-07-08 LAB — CBC
HCT: 34.4 % — ABNORMAL LOW (ref 39.0–52.0)
HCT: 34.1 % — ABNORMAL LOW (ref 39.0–52.0)
Hemoglobin: 10.9 g/dL — ABNORMAL LOW (ref 13.0–17.0)
Hemoglobin: 10.8 g/dL — ABNORMAL LOW (ref 13.0–17.0)
MCH: 29.1 pg (ref 26.0–34.0)
MCH: 29.2 pg (ref 26.0–34.0)
MCHC: 31.7 g/dL (ref 30.0–36.0)
MCHC: 31.7 g/dL (ref 30.0–36.0)
MCV: 91.7 fL (ref 80.0–100.0)
MCV: 92.2 fL (ref 80.0–100.0)
Platelets: 272 10*3/uL (ref 150–400)
Platelets: 365 10*3/uL (ref 150–400)
RBC: 3.75 MIL/uL — ABNORMAL LOW (ref 4.22–5.81)
RBC: 3.7 MIL/uL — ABNORMAL LOW (ref 4.22–5.81)
RDW: 12.8 % (ref 11.5–15.5)
RDW: 12.7 % (ref 11.5–15.5)
WBC: 6 10*3/uL (ref 4.0–10.5)
WBC: 11 10*3/uL — ABNORMAL HIGH (ref 4.0–10.5)
nRBC: 0 % (ref 0.0–0.2)
nRBC: 0 % (ref 0.0–0.2)

## 2019-07-08 LAB — BASIC METABOLIC PANEL
Anion gap: 14 (ref 5–15)
Anion gap: 10 (ref 5–15)
BUN: 15 mg/dL (ref 8–23)
BUN: 19 mg/dL (ref 8–23)
CO2: 31 mmol/L (ref 22–32)
CO2: 28 mmol/L (ref 22–32)
Calcium: 8.9 mg/dL (ref 8.9–10.3)
Calcium: 8.5 mg/dL — ABNORMAL LOW (ref 8.9–10.3)
Chloride: 99 mmol/L (ref 98–111)
Chloride: 105 mmol/L (ref 98–111)
Creatinine, Ser: 0.58 mg/dL — ABNORMAL LOW (ref 0.61–1.24)
Creatinine, Ser: 0.58 mg/dL — ABNORMAL LOW (ref 0.61–1.24)
GFR calc Af Amer: >60 mL/min (ref >60)
GFR calc Af Amer: >60 mL/min (ref >60)
GFR calc non Af Amer: >60 mL/min (ref >60)
GFR calc non Af Amer: >60 mL/min (ref >60)
Glucose, Bld: 221 mg/dL — ABNORMAL HIGH (ref 70–99)
Glucose, Bld: 255 mg/dL — ABNORMAL HIGH (ref 70–99)
Potassium: 2.6 mmol/L — CL (ref 3.5–5.1)
Potassium: 4.6 mmol/L (ref 3.5–5.1)
Sodium: 144 mmol/L (ref 135–145)
Sodium: 143 mmol/L (ref 135–145)

## 2019-07-08 LAB — RENAL FUNCTION PANEL
Albumin: 2.4 g/dL — ABNORMAL LOW (ref 3.5–5.0)
Anion gap: 14 (ref 5–15)
BUN: 15 mg/dL (ref 8–23)
CO2: 26 mmol/L (ref 22–32)
Calcium: 8.7 mg/dL — ABNORMAL LOW (ref 8.9–10.3)
Chloride: 103 mmol/L (ref 98–111)
Creatinine, Ser: 0.62 mg/dL (ref 0.61–1.24)
GFR calc Af Amer: >60 mL/min (ref >60)
GFR calc non Af Amer: >60 mL/min (ref >60)
Glucose, Bld: 194 mg/dL — ABNORMAL HIGH (ref 70–99)
Phosphorus: 3.4 mg/dL (ref 2.5–4.6)
Potassium: 3.1 mmol/L — ABNORMAL LOW (ref 3.5–5.1)
Sodium: 143 mmol/L (ref 135–145)

## 2019-07-08 LAB — MAGNESIUM: Magnesium: 1.5 mg/dL — ABNORMAL LOW (ref 1.7–2.4)

## 2019-07-08 LAB — TROPONIN I (HIGH SENSITIVITY): Troponin I (High Sensitivity): 18 ng/L — ABNORMAL HIGH (ref <18)

## 2019-07-08 LAB — GLUCOSE, CAPILLARY
Glucose-Capillary: 129 mg/dL — ABNORMAL HIGH (ref 70–99)
Glucose-Capillary: 139 mg/dL — ABNORMAL HIGH (ref 70–99)
Glucose-Capillary: 181 mg/dL — ABNORMAL HIGH (ref 70–99)
Glucose-Capillary: 189 mg/dL — ABNORMAL HIGH (ref 70–99)
Glucose-Capillary: 207 mg/dL — ABNORMAL HIGH (ref 70–99)
Glucose-Capillary: 245 mg/dL — ABNORMAL HIGH (ref 70–99)

## 2019-07-08 LAB — HEPARIN LEVEL (UNFRACTIONATED): Heparin Unfractionated: 0.46 IU/mL (ref 0.30–0.70)

## 2019-07-08 LAB — PHOSPHORUS: Phosphorus: 2.6 mg/dL (ref 2.5–4.6)

## 2019-07-08 IMAGING — DX DG CHEST 1V PORT
1 series · 1 of 1 positions shown · non-contrast
Comparison: [DATE]

CLINICAL DATA: Acute respiratory failure. Stroke.

EXAM:
PORTABLE CHEST 1 VIEW

[chest ap]
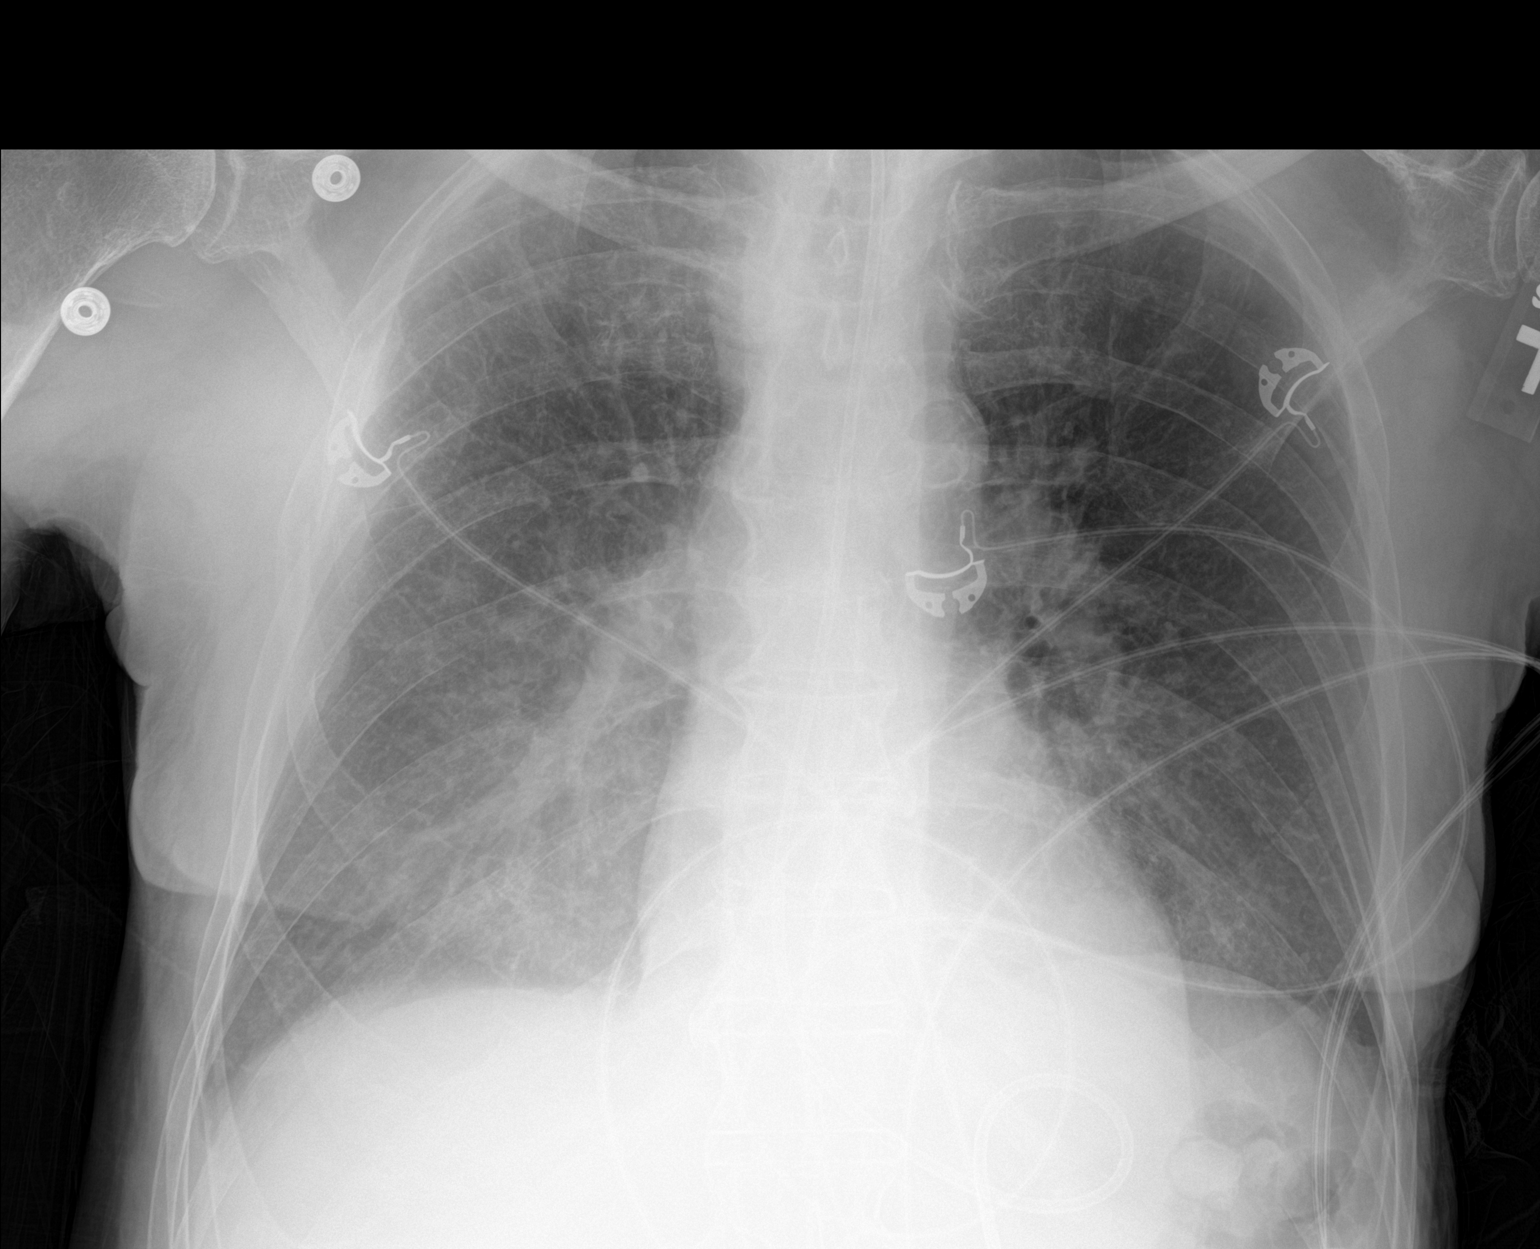

[1 of 1 positions shown; findings below may reference images not displayed]

FINDINGS: Feeding tube extending into the stomach. Normal sized heart. Aortic
arch calcifications. The lungs remain hyperexpanded with interval
hazy density in both lower lung zones, greater on the right. No
visible pleural fluid at the lateral costophrenic angles. Thoracic
spine and mild bilateral shoulder degenerative changes.
IMPRESSION: 1. Interval bibasilar atelectasis, pneumonia or posteriorly layering
pleural fluid, greater on the right.
2. Stable changes of COPD.
3. Aortic atherosclerosis.

## 2019-07-08 IMAGING — CR DG CHEST 1V PORT
1 series · 1 of 1 positions shown · non-contrast
Comparison: Film from earlier in the same day.

CLINICAL DATA: Status post endotracheal intubation

EXAM:
PORTABLE CHEST 1 VIEW

[AP]
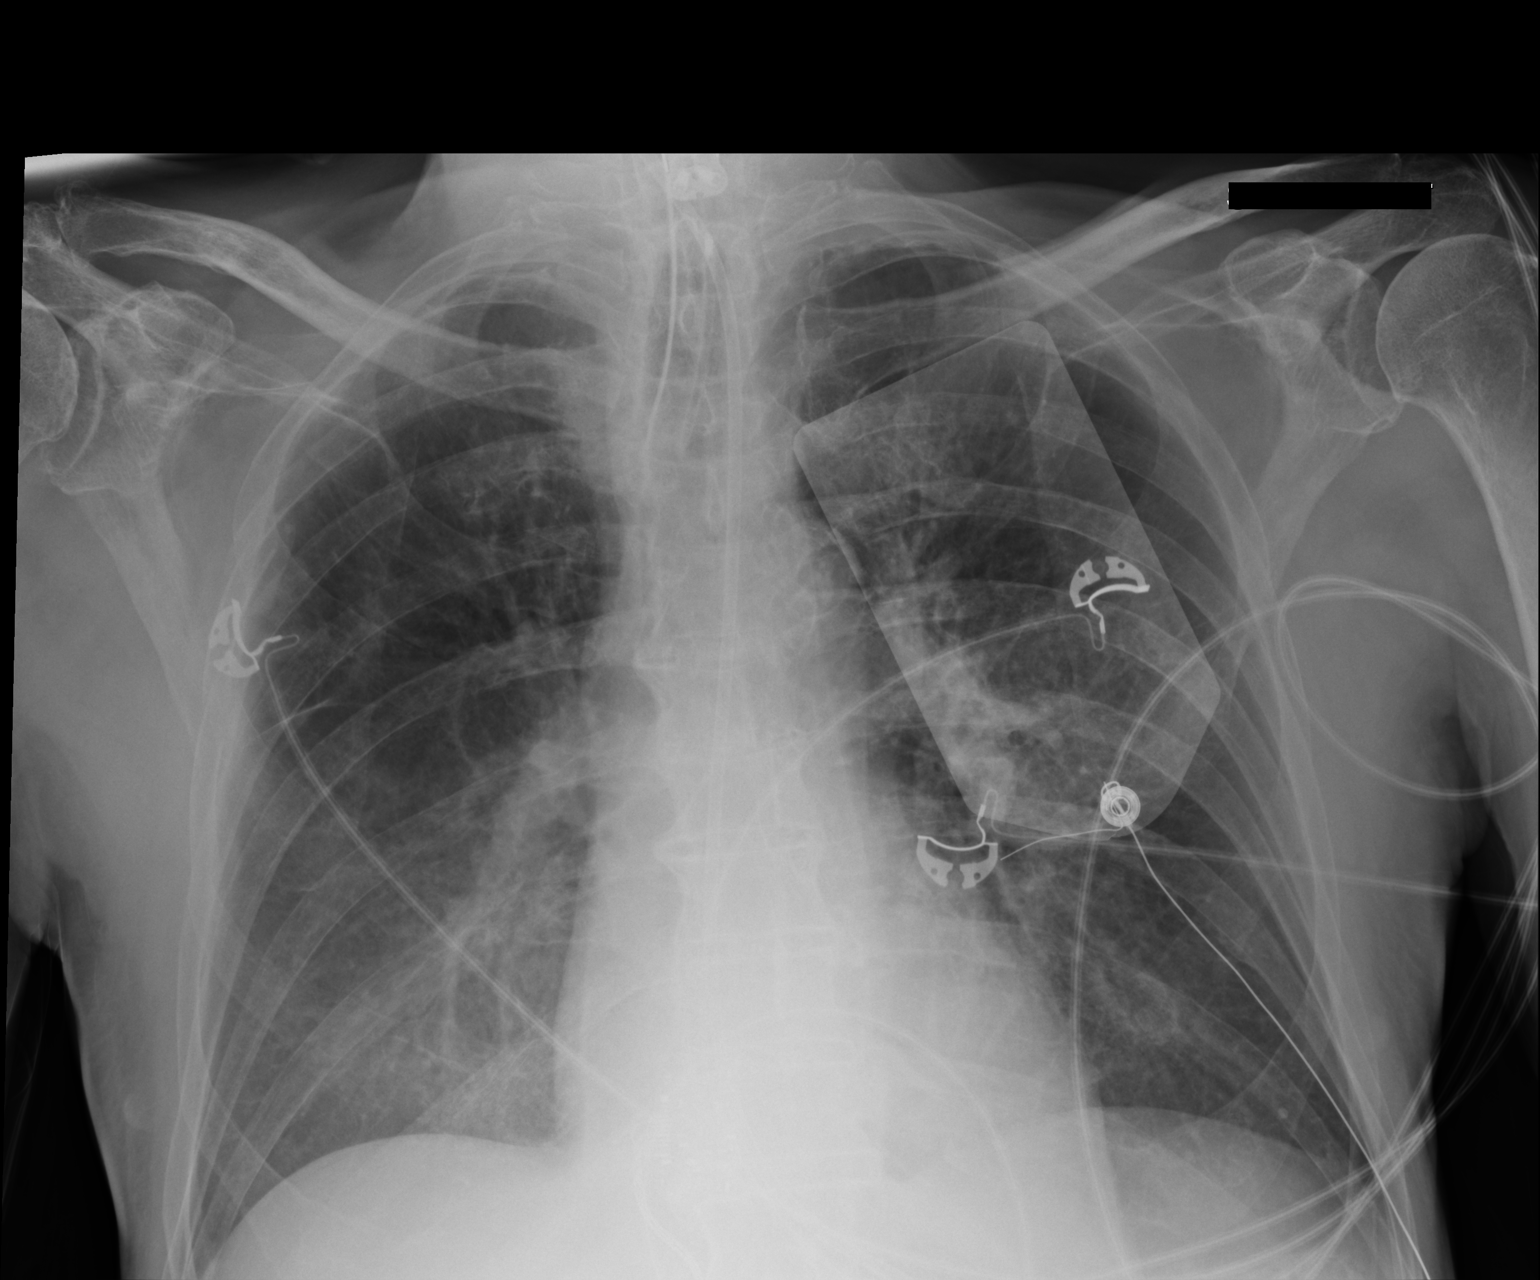

[1 of 1 positions shown; findings below may reference images not displayed]

FINDINGS: Feeding catheter is noted extending towards the stomach.
Endotracheal tube is noted in satisfactory position. The lungs are
well aerated bilaterally. Mild increased density is noted in the
right base consistent with atelectasis similar to that noted on the
prior exam. Previously seen effusions are not well appreciated. No
bony abnormality is noted.
IMPRESSION: Persistent right basilar atelectasis.

Tubes and lines in satisfactory position.

## 2019-07-08 IMAGING — MR MR HEAD W/O CM
9 of 10 series · 37 of 48 positions shown · non-contrast
Comparison: MRI of the brain [DATE].

CLINICAL DATA: Encephalopathy.

EXAM:
MRI HEAD WITHOUT CONTRAST
TECHNIQUE: Multiplanar, multiecho pulse sequences of the brain and surrounding
structures were obtained without intravenous contrast.

[Series 3: DWI · axial · 3.0mm · 1.09mm/px · z∈[-20,+124]mm · 11 of 104 slices shown (1 of 4)]
[im 1/104]
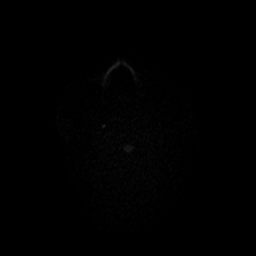
[im 11/104]
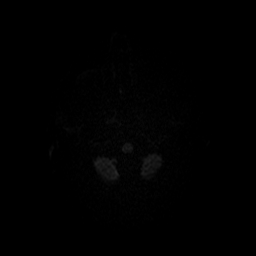
[im 21/104]
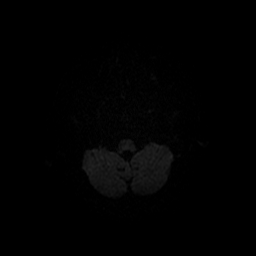
[im 31/104]
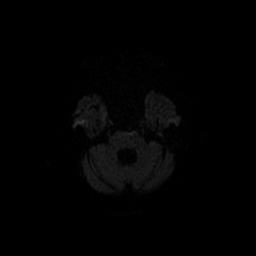
[im 42/104]
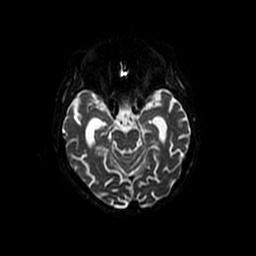
[im 52/104]
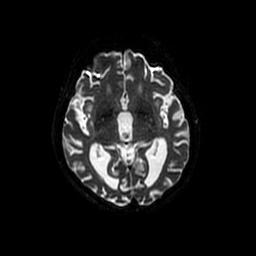
[im 62/104]
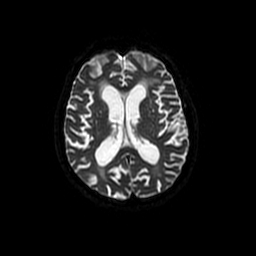
[im 73/104]
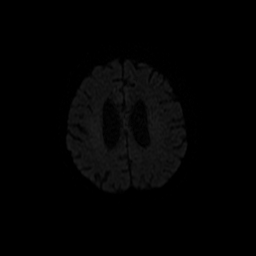
[im 83/104]
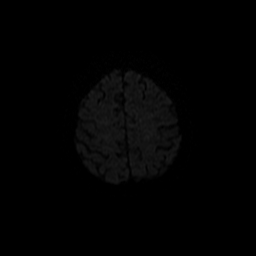
[im 93/104]
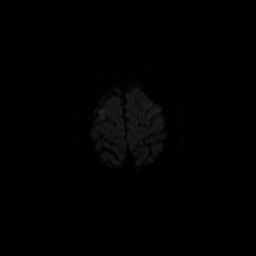
[im 104/104]
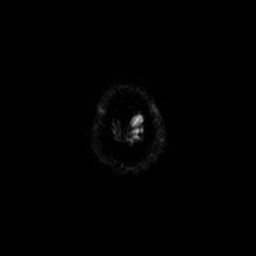

[Series 4: DWI · coronal · 5.0mm · 1.09mm/px · 7 of 70 slices shown (2 of 4)]
[im 1/70]
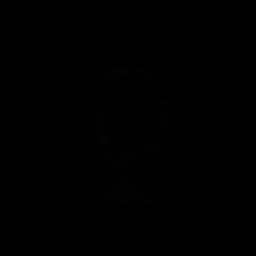
[im 12/70]
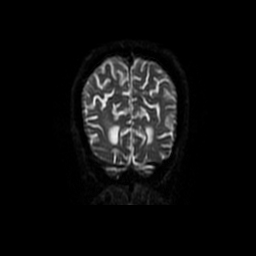
[im 24/70]
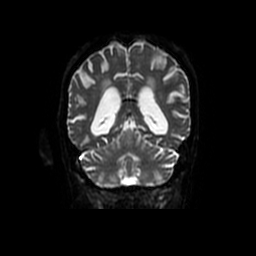
[im 35/70]
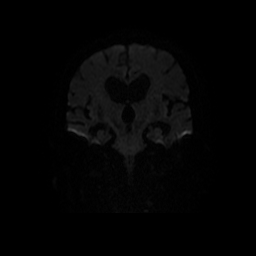
[im 47/70]
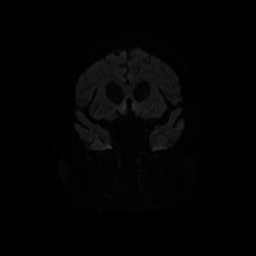
[im 58/70]
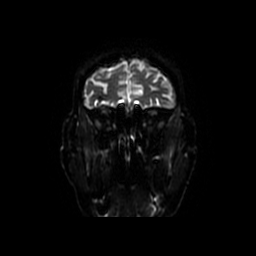
[im 70/70]
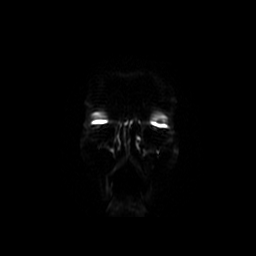

[Series 5: T1 · sagittal · 5.0mm · 0.47mm/px · 2 of 25 slices shown (1 of 2)]
[im 1/25]
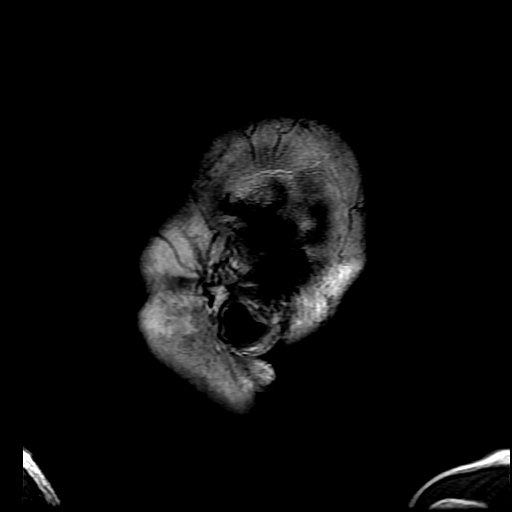
[im 25/25]
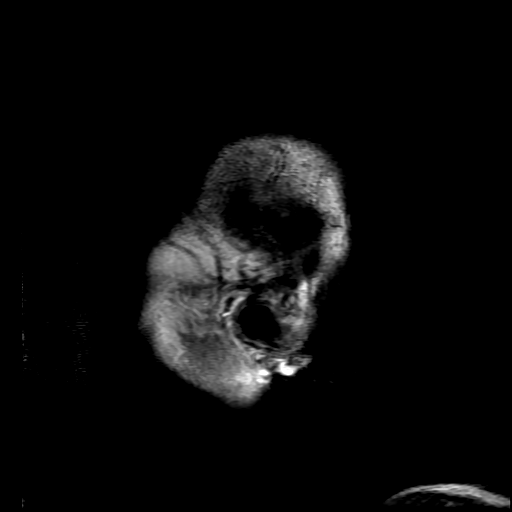

[Series 6: T2 · axial · 5.0mm · 0.43mm/px · z∈[-34,+113]mm · 2 of 26 slices shown (1 of 2)]
[im 1/26]
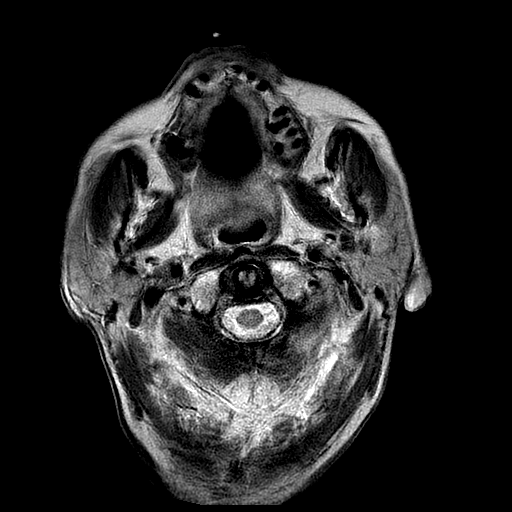
[im 26/26]
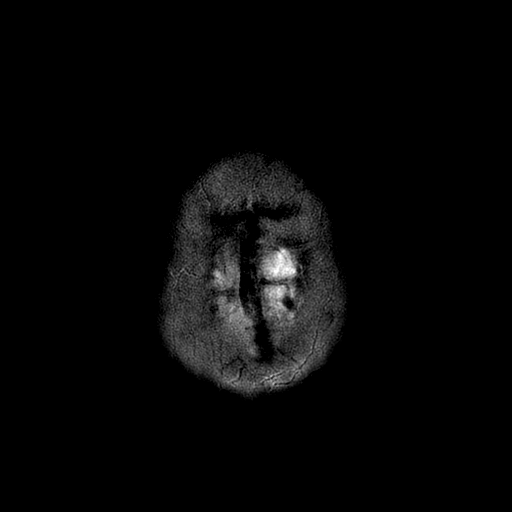

[Series 7: FLAIR · axial · 3.0mm · 0.43mm/px · z∈[-34,+113]mm · 3 of 27 slices shown]
[im 1/27]
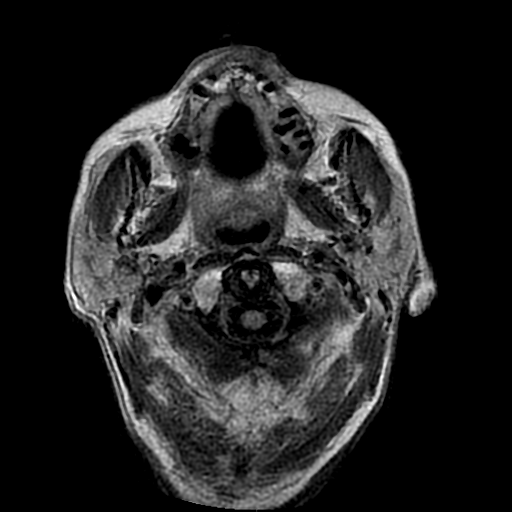
[im 14/27]
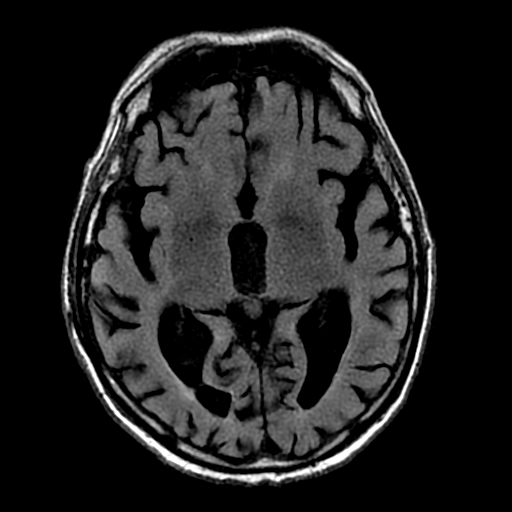
[im 27/27]
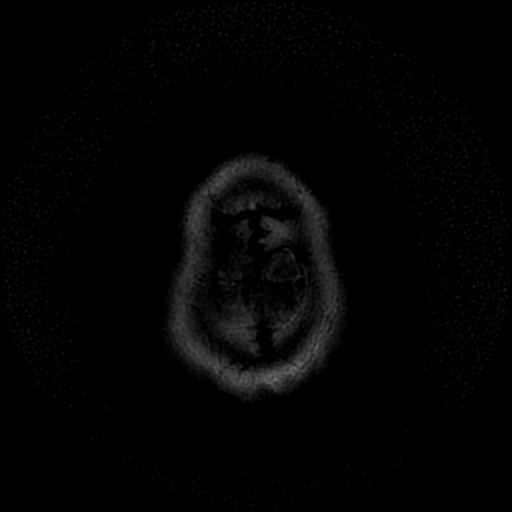

[Series 9: T1 · axial · 3.0mm · 0.47mm/px · 1 of 108 slices shown (2 of 2)]
[im 1/108]
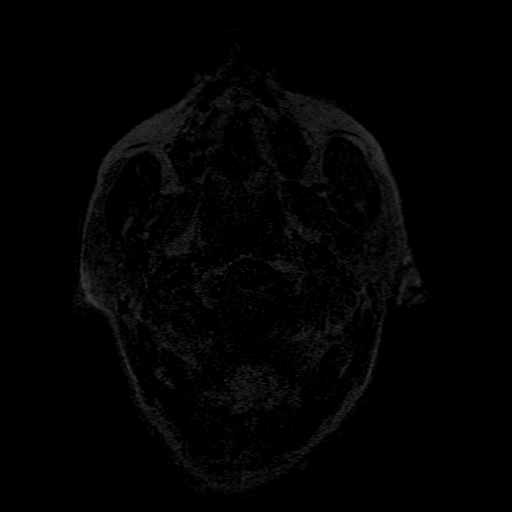

[Series 10: T2 · coronal · 5.0mm · 0.43mm/px · 3 of 31 slices shown (2 of 2)]
[im 1/31]
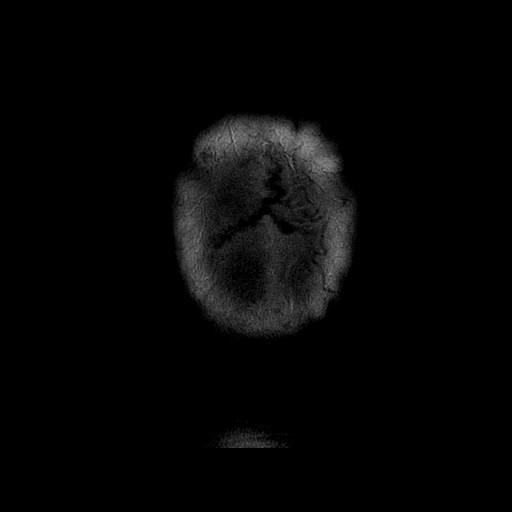
[im 16/31]
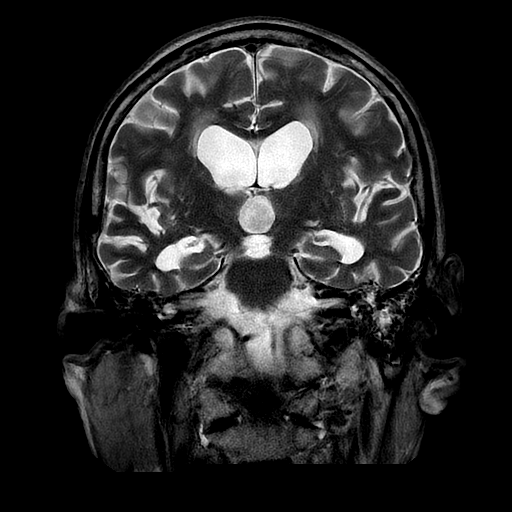
[im 31/31]
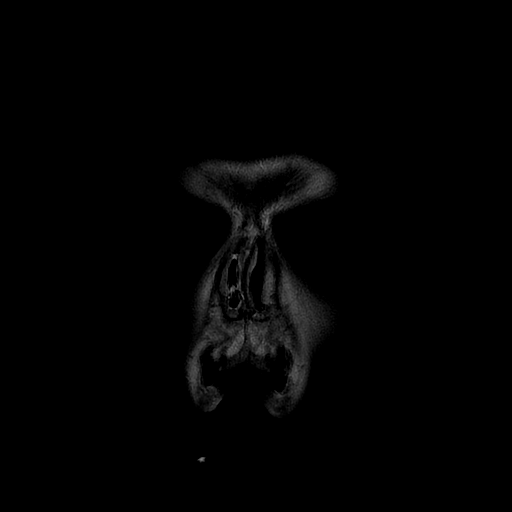

[Series 300: DWI · axial · 3.0mm · 1.09mm/px · z∈[-20,+124]mm · 5 of 52 slices shown (3 of 4)]
[im 1/52]
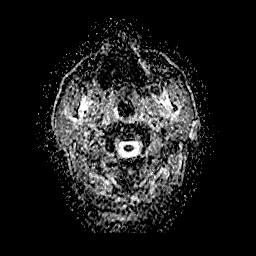
[im 13/52]
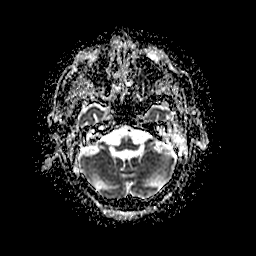
[im 26/52]
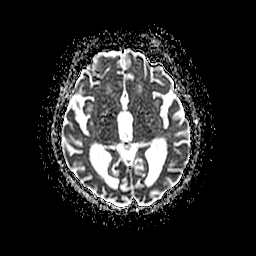
[im 39/52]
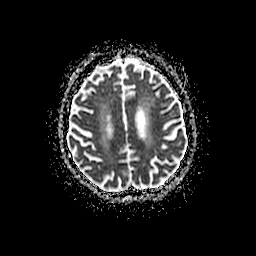
[im 52/52]
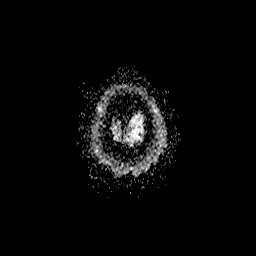

[Series 400: DWI · coronal · 5.0mm · 1.09mm/px · 3 of 35 slices shown (4 of 4)]
[im 1/35]
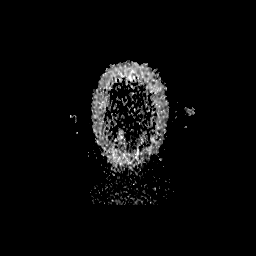
[im 18/35]
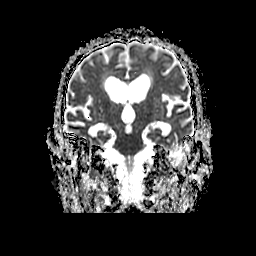
[im 35/35]
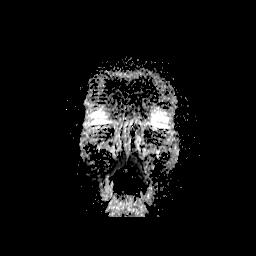

[37 of 48 positions shown; findings below may reference images not displayed]

FINDINGS: This study is degraded by motion.

Brain: No acute infarction, hemorrhage, extra-axial collection or
mass lesion.

Scattered and confluent foci of T2 hyperintensity are seen within
the white matter of the cerebral hemispheres, nonspecific, most
likely related to chronic small vessel ischemia, unchanged from
prior MRI. Foci of susceptibility artifact are noted in the right
frontal level and left occipital lobe, likely represent hemosiderin
deposits, unchanged from prior MRI. There is marked prominence of
the ventricular system, cerebral and cerebellar sulci, consistent
with parenchymal volume loss.

Vascular: Normal flow voids.

Skull and upper cervical spine: Normal marrow signal.

Sinuses/Orbits: Bilateral mastoid effusions. Right lens surgery.

Other: None.
IMPRESSION: 1. No acute intracranial abnormality.
2. Marked parenchymal volume loss.
3. Moderate chronic small vessel ischemia.
4. Bilateral mastoid effusions.

## 2019-07-08 MED ORDER — POTASSIUM CHLORIDE 20 MEQ/15ML (10%) PO SOLN
40.0000 meq | Freq: Once | ORAL | Status: AC
  Administered 2019-07-08: 40 meq
  Filled 2019-07-08: qty 30

## 2019-07-08 MED ORDER — CHLORHEXIDINE GLUCONATE 0.12% ORAL RINSE (MEDLINE KIT)
15.0000 mL | Freq: Two times a day (BID) | OROMUCOSAL | Status: DC
  Administered 2019-07-08 – 2019-07-23 (×30): 15 mL via OROMUCOSAL

## 2019-07-08 MED ORDER — DOCUSATE SODIUM 50 MG/5ML PO LIQD
100.0000 mg | Freq: Two times a day (BID) | ORAL | Status: DC | PRN
  Filled 2019-07-08: qty 10

## 2019-07-08 MED ORDER — POTASSIUM CHLORIDE 10 MEQ/100ML IV SOLN
10.0000 meq | INTRAVENOUS | Status: DC
  Administered 2019-07-08 (×2): 10 meq via INTRAVENOUS
  Filled 2019-07-08 (×2): qty 100

## 2019-07-08 MED ORDER — POTASSIUM CHLORIDE 20 MEQ/15ML (10%) PO SOLN
60.0000 meq | Freq: Once | ORAL | Status: AC
  Administered 2019-07-08: 60 meq
  Filled 2019-07-08: qty 45

## 2019-07-08 MED ORDER — ORAL CARE MOUTH RINSE
15.0000 mL | OROMUCOSAL | Status: DC
  Administered 2019-07-08 – 2019-07-22 (×133): 15 mL via OROMUCOSAL

## 2019-07-08 MED ORDER — POTASSIUM CHLORIDE 20 MEQ/15ML (10%) PO SOLN: 40.0000 meq | Freq: Three times a day (TID) | ORAL | Status: DC

## 2019-07-08 MED ORDER — PANTOPRAZOLE SODIUM 40 MG PO PACK
40.0000 mg | PACK | Freq: Every day | ORAL | Status: DC
  Administered 2019-07-08 – 2019-07-21 (×14): 40 mg
  Filled 2019-07-08 (×14): qty 20

## 2019-07-08 MED ORDER — FENTANYL CITRATE (PF) 100 MCG/2ML IJ SOLN: 25.0000 ug | INTRAMUSCULAR | Status: DC | PRN

## 2019-07-08 MED ORDER — MAGNESIUM SULFATE 2 GM/50ML IV SOLN
2.0000 g | Freq: Once | INTRAVENOUS | Status: AC
  Administered 2019-07-08: 2 g via INTRAVENOUS
  Filled 2019-07-08: qty 50

## 2019-07-08 MED ORDER — RACEPINEPHRINE HCL 2.25 % IN NEBU
0.5000 mL | INHALATION_SOLUTION | Freq: Once | RESPIRATORY_TRACT | Status: AC
  Administered 2019-07-08: 0.5 mL via RESPIRATORY_TRACT
  Filled 2019-07-08: qty 0.5

## 2019-07-08 MED ORDER — BISACODYL 10 MG RE SUPP: 10.0000 mg | Freq: Every day | RECTAL | Status: DC | PRN

## 2019-07-08 MED ORDER — FENTANYL CITRATE (PF) 100 MCG/2ML IJ SOLN
25.0000 ug | INTRAMUSCULAR | Status: DC | PRN
  Administered 2019-07-08 – 2019-07-12 (×5): 100 ug via INTRAVENOUS
  Administered 2019-07-21: 25 ug via INTRAVENOUS
  Filled 2019-07-08 (×6): qty 2

## 2019-07-08 MED ORDER — DEXAMETHASONE SODIUM PHOSPHATE 10 MG/ML IJ SOLN
6.0000 mg | Freq: Four times a day (QID) | INTRAMUSCULAR | Status: AC
  Administered 2019-07-08 (×2): 6 mg via INTRAVENOUS
  Filled 2019-07-08 (×2): qty 1

## 2019-07-08 MED FILL — Medication: Qty: 1 | Status: AC

## 2019-07-08 NOTE — Progress Notes (Signed)
Patient placed on bipap per order. ABG was obtained after on the bipap. Results given to NP. Patient pulling at mask and trying to take mask off.

## 2019-07-08 NOTE — Progress Notes (Signed)
Notified of PEA cardiac arrest while in MRI.  Reportedly patient not hypoxic doing well on room air, was confused but alert.  Was being transferred to stretcher after completion of MRI brain when staff report his eyes rolled back, he went unresponsive.  No reports of any seizure like activity.  Was found in PEA on monitor with ROSC after 3 minutes.  Intubated by anesthesia.    On return to ICU, patient is hemodynamically stable, noted to be in variable rate aflutter with ventricular rate in the 60s, starting to arouse.   Blood pressure (!) 142/79, pulse 68, temperature 98.5 F (36.9 C), temperature source Axillary, resp. rate 17, height 5\' 10"  (1.778 m), weight 71.9 kg, SpO2 100 %.   Vent Mode: PRVC FiO2 (%):  [50 %-100 %] 100 % Set Rate:  [12 bmp] 12 bmp Vt Set:  [580 mL] 580 mL PEEP:  [5 cmH20] 5 cmH20 Plateau Pressure:  [10 cmH20] 10 cmH20  General:  Elderly male in NAD on MV HEENT: MM pink/moist, ETT, pupils 3/reactive- roving eye movements Neuro: does not f/c, attempts to open eyes, withdrawals in extremities- since now awake, trying to communicate, spont MAE  , rate 60s, no murmur PULM:  Non labored on MV supported, clear bs, +gag/ cough GI: soft, bs+ Extremities: warm/dry, no LE edema  Skin: no rashes    PEA arrest - unclear what precipitated, ?hypokalemia (in which we have been trying to aggressively replete) but doubtful arrhythmia would have that quickly decompensated to PEA.  He has been on heparin therefore anticoagulated and no significant hypoxia, as was on room air prior to event.    P:  Defer TTM given patient is now awake and purposeful  Repeat CBC/ BMP/ Mag/ trop/ EKG MRI was completed- follow results Cardiology called, will continue to see given aflutter  Will continue heparin  Neurology will start LTM- to evaluate for possible subclinical seizure  Full MV support, PRVC, rate 12 CXR/ ABG pending  VAP bundle PAD with prn fentanyl    Patient's son  [id number], was called and updated.    Additional CCT 35 mins   [name], MSN, AGACNP-BC [facility] 07/08/2019, 3:47 PM

## 2019-07-08 NOTE — Progress Notes (Addendum)
The patient has been seen in conjunction with Rosaria Ferries, PAC. All aspects of care have been considered and discussed. The patient has been personally interviewed, examined, and all clinical data has been reviewed.   PEA arrest, during MRI. Possibly related to hypoxia or other metabolic / neurogenic mechanism.  Reviewed all data. Potasium 3.1 and ECG post resuscitation demonstrates recurrent atrail flutter with controlled but not excessively slow rate.  Plan to cycle cardiac markers and replete potassium.  Otherwise, close observation.    Progress Note  Patient Name: Seth Boyd Date of Encounter: 07/08/2019  Primary Cardiologist:   Nahser   Subjective   Awake on the vent, not clear that he understands any questions  Inpatient Medications    Scheduled Meds: . aspirin  81 mg Per Tube Daily  . atorvastatin  40 mg Per Tube QHS  . chlorhexidine  15 mL Mouth Rinse BID  . Chlorhexidine Gluconate Cloth  6 each Topical Daily  . feeding supplement (PRO-STAT SUGAR FREE 64)  30 mL Per Tube Daily  . insulin aspart  0-15 Units Subcutaneous Q4H  . mouth rinse  15 mL Mouth Rinse q12n4p  . potassium chloride  40 mEq Per Tube Once  . vitamin B-12  2,000 mcg Per Tube Daily   Continuous Infusions: . sodium chloride Stopped (07/08/19 0758)  . dexmedetomidine (PRECEDEX) IV infusion 1.2 mcg/kg/hr (07/08/19 0800)  . feeding supplement (OSMOLITE 1.2 CAL) 1,000 mL (07/07/19 1900)  . heparin 800 Units/hr (07/08/19 0800)  . levETIRAcetam 500 mg (07/08/19 1046)   PRN Meds: sodium chloride, acetaminophen, acetaminophen, hydrALAZINE   Vital Signs    Vitals:   07/08/19 1300 07/08/19 1400 07/08/19 1519 07/08/19 1600  BP: (!) 142/79   (!) 147/61  Pulse: 73 68  70  Resp: 18 17  20   Temp:    97.7 F (36.5 C)  TempSrc:    Axillary  SpO2: 100% 100% 100% 100%  Weight:      Height:        Intake/Output Summary (Last 24 hours) at 07/08/2019 1637 Last data filed at 07/08/2019 0800 Gross  per 24 hour  Intake 1819.32 ml  Output 1750 ml  Net 69.32 ml   Last 3 Weights 07/08/2019 07/07/2019 07/03/2019  Weight (lbs) 158 lb 8.2 oz 154 lb 15.7 oz 144 lb 6.4 oz  Weight (kg) 71.9 kg 70.3 kg 65.5 kg      Telemetry   14:45 - pt put back on the monitor, in junctional brady, HR 40 14:49 - SVT vs coarse VT>>ST vs Afib, + vent ectopy and several episodes NSVT < 6 sec 14:51 - Afib, PVCs 15:02 atrial flutter, rate settled down and became stable - Personally Reviewed  ECG    02/09 ECG atrial flutter w/ variable block, HR 66  Physical Exam   General: Well developed, elderly, male in no acute distress Head: age appropriate appearance, normocephalic and atraumatic Lungs: clear bilaterally to auscultation. Heart: Irreg R&R S1 S2, without rub or gallop. No murmur. upper extremity pulses are 2+ & equal. No JVD. Abdomen: Bowel sounds are present, abdomen soft and non-tender without masses or  hernias noted. Msk: weak strength and tone for age. Extremities: No clubbing, cyanosis or edema.    Skin:  No rashes or lesions noted. Neuro: Awake, moved hand in response to request  Labs    High Sensitivity Troponin:  No results for input(s): TROPONINIHS in the last 720 hours.    Chemistry Recent Labs  Lab 07/05/19 [phone] 07/05/19  4132 07/08/19 0404 07/08/19 0404 07/08/19 1143 07/08/19 1520 07/08/19 1612  NA 146*   < > 144   < > 143 143 141  K 3.5   < > 2.6*   < > 3.1* 3.1* 4.5  CL 106  --  99  --   --  103  --   CO2 25  --  31  --   --  26  --   GLUCOSE 123*  --  221*  --   --  194*  --   BUN 20  --  15  --   --  15  --   CREATININE 0.61  --  0.58*  --   --  0.62  --   CALCIUM 8.9  --  8.9  --   --  8.7*  --   ALBUMIN  --   --   --   --   --  2.4*  --   GFRNONAA >60  --  >60  --   --  >60  --   GFRAA >60  --  >60  --   --  >60  --   ANIONGAP 15  --  14  --   --  14  --    < > = values in this interval not displayed.     Hematology Recent Labs  Lab 07/07/19 0528 07/07/19 0528  07/08/19 0404 07/08/19 0404 07/08/19 1143 07/08/19 1520 07/08/19 1612  WBC 6.7  --  6.0  --   --  11.0*  --   RBC 3.28*  --  3.75*  --   --  3.70*  --   HGB 9.6*   < > 10.9*   < > 10.5* 10.8* 10.2*  HCT 29.7*   < > 34.4*   < > 31.0* 34.1* 30.0*  MCV 90.5  --  91.7  --   --  92.2  --   MCH 29.3  --  29.1  --   --  29.2  --   MCHC 32.3  --  31.7  --   --  31.7  --   RDW 12.6  --  12.8  --   --  12.7  --   PLT 224  --  272  --   --  365  --    < > = values in this interval not displayed.    BNP No results for input(s): BNP, PROBNP in the last 168 hours.    Radiology    MR BRAIN WO CONTRAST  Result Date: 07/08/2019 CLINICAL DATA:  Encephalopathy. EXAM: MRI HEAD WITHOUT CONTRAST TECHNIQUE: Multiplanar, multiecho pulse sequences of the brain and surrounding structures were obtained without intravenous contrast. COMPARISON:  MRI of the brain June 30, 2019. FINDINGS: This study is degraded by motion. Brain: No acute infarction, hemorrhage, extra-axial collection or mass lesion. Scattered and confluent foci of T2 hyperintensity are seen within the white matter of the cerebral hemispheres, nonspecific, most likely related to chronic small vessel ischemia, unchanged from prior MRI. Foci of susceptibility artifact are noted in the right frontal level and left occipital lobe, likely represent hemosiderin deposits, unchanged from prior MRI. There is marked prominence of the ventricular system, cerebral and cerebellar sulci, consistent with parenchymal volume loss. Vascular: Normal flow voids. Skull and upper cervical spine: Normal marrow signal. Sinuses/Orbits: Bilateral mastoid effusions. Right lens surgery. Other: None. IMPRESSION: 1. No acute intracranial abnormality. 2. Marked parenchymal volume loss. 3. Moderate chronic small vessel ischemia. 4.  Bilateral mastoid effusions. Electronically Signed   By: Baldemar Lenis M.D.   On: 07/08/2019 15:36   DG Chest Port 1 View  Result  Date: 07/08/2019 CLINICAL DATA:  Status post endotracheal intubation EXAM: PORTABLE CHEST 1 VIEW COMPARISON:  Film from earlier in the same day. FINDINGS: Feeding catheter is noted extending towards the stomach. Endotracheal tube is noted in satisfactory position. The lungs are well aerated bilaterally. Mild increased density is noted in the right base consistent with atelectasis similar to that noted on the prior exam. Previously seen effusions are not well appreciated. No bony abnormality is noted. IMPRESSION: Persistent right basilar atelectasis. Tubes and lines in satisfactory position. Electronically Signed   By: Alcide Clever M.D.   On: 07/08/2019 15:44   DG Chest Port 1 View  Result Date: 07/08/2019 CLINICAL DATA:  Acute respiratory failure. Stroke. EXAM: PORTABLE CHEST 1 VIEW COMPARISON:  07/06/2019 FINDINGS: Feeding tube extending into the stomach. Normal sized heart. Aortic arch calcifications. The lungs remain hyperexpanded with interval hazy density in both lower lung zones, greater on the right. No visible pleural fluid at the lateral costophrenic angles. Thoracic spine and mild bilateral shoulder degenerative changes. IMPRESSION: 1. Interval bibasilar atelectasis, pneumonia or posteriorly layering pleural fluid, greater on the right. 2. Stable changes of COPD. 3. Aortic atherosclerosis. Electronically Signed   By: Beckie Salts M.D.   On: 07/08/2019 08:52    Cardiac Studies   ECHO: 07/04/2019 1. Left ventricular ejection fraction, by visual estimation, is 65 to  70%. The left ventricle has hyperdynamic function. There is no left  ventricular hypertrophy.  2. Left ventricular diastolic parameters are indeterminate.  3. Global right ventricle has normal systolic function.The right  ventricular size is normal.  4. Left atrial size was normal.  5. Right atrial size was normal.  6. The mitral valve is normal in structure. No evidence of mitral valve  regurgitation.  7. The tricuspid  valve is normal in structure. Tricuspid valve  regurgitation is not demonstrated.  8. The aortic valve was not well visualized. Aortic valve regurgitation  is not visualized. No evidence of aortic valve stenosis.  9. The pulmonic valve was not well visualized. Pulmonic valve  regurgitation is not visualized.   Patient Profile     85 y.o. male with hx HTN, DM, admitted 01/31 with stroke-like sx (MRI neg CVA), encephalopathy possibly 2nd Sz, 02/04 had new onset atrial fibrillation/atrial flutter. Was sent to MRI 02/09 because MS was deteriorating>>cardiac arrest in MRI.  Assessment & Plan    1. Atrial fib/flutter: - was in SR till after code, then fib>>flutter - rate is controlled - on heparin w/out signs/sx bleeding  2. Encephalopathy - cause unknown - MRI repeated today w/out acute abnormality - ?Sz, but none witnessed - concern for toxic encephalopathy, but no toxins found so far - Neuro seeing - had been extubated earlier today, re-intubated for the 2nd time after CPR  3. Cardiac arrest - pt was off the monitor for the MRI - according to the notes, he became bradycardic>>PEA at 14:50, ROSC at 15:00 - pt lost consciousness, was in junctional brady when put back on the monitor - a great deal of vent ectopy seen, short runs NSVT -  Afib>>Aflutter now - as he had had problems maintaining his airway and MS had deteriorated today, suspect hypoxia as cause of cardiac arrest - Also concern for hypokalemia as a cause, possible VT or torsades. - However, would not expect the presenting rhythm  to be junctional brady when placed back on the monitor if he had been in VT/Torsades/VF -  Will continue to follow     For questions or updates, please contact [facility] Please consult [email or web address] for contact info under        Signed, Theodore Demark, PA-C  07/08/2019, 4:37 PM

## 2019-07-08 NOTE — Progress Notes (Signed)
LTM started; no initial skin breakdown. Nurse aware of event button.

## 2019-07-08 NOTE — Progress Notes (Signed)
SLP Cancellation Note  Patient Details Name: Seth Boyd MRN: [id number] DOB: 04/07/1935   Cancelled treatment:       Reason Eval/Treat Not Completed: Medical issues which prohibited therapy. Pt with worsening mentation, respirations compared to previous date per RN. Per chart this morning there is concern for airway edema. Will hold POs for now per RN recommendation but will f/u as able. Note that prior to his reintubation, pt was having difficulty managing secretions with NPO recommended at that time.   Mahala Menghini., M.A. CCC-SLP Acute Rehabilitation Services Pager [phone] Office [phone]  07/08/2019, 8:27 AM

## 2019-07-08 NOTE — Procedures (Signed)
Cortrak  Person Inserting Tube:  [name], RD Tube Type:  Cortrak - 43 inches Tube Location:  Right nare Initial Placement:  Stomach Secured by: Bridle Technique Used to Measure Tube Placement:  Documented cm marking at nare/ corner of mouth Cortrak Secured At:  67 cm   Cortrak Tube Team Note:  Consult received to place a Cortrak feeding tube.   No x-ray is required. RN may begin using tube.    If the tube becomes dislodged please keep the tube and contact the Cortrak team at [email or web address] ([email or web address]) for replacement.  If after hours and replacement cannot be delayed, place a NG tube and confirm placement with an abdominal x-ray.    Seth Gage, MS, RD, LDN RD pager number and weekend/on-call pager number located in Amion.  

## 2019-07-08 NOTE — Progress Notes (Signed)
Reason for consult: Altered mental status  Subjective: Patient extubated, has waxing and waning mental status and episodes where he becomes apneic.  Blood gas normal.  Patient was in PEA arrest, was coded with ROSC 3 minutes when going for repeat MRI brain.    ROS: negative except above  Examination  Vital signs in last 24 hours: Temp:  [97.8 F (36.6 C)-98.5 F (36.9 C)] 98.5 F (36.9 C) (02/09 0800) Pulse Rate:  [43-118] 70 (02/09 1600) Resp:  [6-27] 20 (02/09 1600) BP: (107-187)/(57-92) 147/61 (02/09 1600) SpO2:  [88 %-100 %] 100 % (02/09 1600) FiO2 (%):  [40 %-100 %] 40 % (02/09 1614) Weight:  [71.9 kg] 71.9 kg (02/09 0254)  General: lying in bed CVS: pulse-normal rate and rhythm RS: breathing comfortably Extremities: normal   Neuro: MS: Alert to himself, incorrectly stated age and month.  No evidence of aphasia.  Dysarthric, follows commands CN: pupils equal and reactive,  EOMI, face symmetric, tongue midline, normal sensation over face, Motor: 5/5 strength in all 4 extremities Reflexes:  plantars: flexor Coordination: normal Gait: not tested  Basic Metabolic Panel: Recent Labs  Lab 07/02/19 0933 07/02/19 0933 07/03/19 0353 07/03/19 0422 07/04/19 0346 07/04/19 0346 07/05/19 0341 07/08/19 0404 07/08/19 1143 07/08/19 1520 07/08/19 1612  NA 143   < > 143   < > 145   < > 146* 144 143 143 141  K 2.9*   < > 3.3*   < > 4.2   < > 3.5 2.6* 3.1* 3.1* 4.5  CL 100   < > 105  --  107  --  106 99  --  103  --   CO2 28   < > 27  --  27  --  25 31  --  26  --   GLUCOSE 158*   < > 169*  --  198*  --  123* 221*  --  194*  --   BUN 17   < > 23  --  30*  --  20 15  --  15  --   CREATININE 0.90   < > 0.73  --  0.79  --  0.61 0.58*  --  0.62  --   CALCIUM 9.0   < > 8.8*   < > 9.1   < > 8.9 8.9  --  8.7*  --   MG 2.0  --  2.0  --   --   --   --  1.5*  --   --   --   PHOS 2.7  --  3.5  --   --   --   --  2.6  --  3.4  --    < > = values in this interval not displayed.     CBC: Recent Labs  Lab 07/05/19 0341 07/05/19 0341 07/06/19 0159 07/06/19 0159 07/07/19 0528 07/08/19 0404 07/08/19 1143 07/08/19 1520 07/08/19 1612  WBC 8.5  --  5.7  --  6.7 6.0  --  11.0*  --   HGB 11.3*   < > 10.2*   < > 9.6* 10.9* 10.5* 10.8* 10.2*  HCT 35.4*   < > 31.8*   < > 29.7* 34.4* 31.0* 34.1* 30.0*  MCV 90.8  --  90.1  --  90.5 91.7  --  92.2  --   PLT 233  --  242  --  224 272  --  365  --    < > = values in this  interval not displayed.     Coagulation Studies: No results for input(s): LABPROT, INR in the last 72 hours.  Imaging Reviewed:     ASSESSMENT AND PLAN  85 year old male with diabetes mellitus presented with aphasia and flaccid right-sided weakness on 1/31.  Was subsequently intubated due to lethargic state.  MRI brain was negative for acute stroke.  Initial EEG as well as long-term EEG negative for epileptiform discharges. Patient was extubated on 2/2 and was following simple commands.  Patient was continued on Keppra 500 mg twice daily.  Neurology signed off and plan to involve as outpatient.  Since then, patient would have unexplained episodes of hypoxia as well as somnolence.  Also developed new onset atrial flutter with episodes of bradycardia.  Was reconsulted today after the patient was having episodes of apnea when he became somnolent, on my assessment patient was awake and following commands.  ABG not suggestive of hypercarbia therefore making myasthenia gravis/hypercarbic respiratory failure unlikely.  Patient was being taken for repeat MRI brain, suddenly had PEA arrest and was coded.  3 minutes, patient intubated but now following commands.  Long-term EEG placed to look for subclinical seizures   Acute metabolic encephalopathy Suspected seizure on presentation Unexplained episodes of apnea PEA arrest  Recommendations Repeat overnight EEG to look for focal seizures MRI brain without contrast repeated : No acute findings Frequent  neurochecks Continue Keppra 500 mg twice daily Seizure precautions  Neurology will continue to follow  [place] Pager Number [phone] For questions after 7pm please refer to AMION to reach the Neurologist on call

## 2019-07-08 NOTE — Progress Notes (Signed)
CRITICAL VALUE ALERT  Critical Value:  Potassium 2.6  Date & Time Notied:  07/07/2018 0800  Provider Notified: Critical Care Medicine  Orders Received/Actions taken: Pending.

## 2019-07-08 NOTE — Progress Notes (Signed)
Pt coded after MRI was completed ,Code was called, we did CPR on pt  for less than 5 minutes, code team arrived pt intubated, pt on his way to ICU

## 2019-07-08 NOTE — Progress Notes (Signed)
NAME:  Seth Boyd, MRN:  [id number], DOB:  [birth year], LOS: 9 ADMISSION DATE:  03-Jul-2019, CONSULTATION DATE:  2019/07/03 REFERRING MD:  Ophelia Charter, CHIEF COMPLAINT:  Encephalopathy   Brief History   85 y.o. male, goes by Seth Boyd, with prior hx HTN/ DM presenting after being found on floor with acute encephalopathy.  Presented as code stroke.  Noted to have initial left gaze.  LSW 1/30 ~4 pm.  Rule out for stroke with negative CTH/ CTA.  Concern for seizure +/- toxic/ metabolic encephalopathy.    Past Medical History  HTN, DM  Significant Hospital Events   1/31 Admit 2/02 extubated 2/03 difficulty with agitation and airway protection, weak cough 2/04 reintubated early am, a flutter New onset a fib/flutter - on heparin 2/5 issues with foley leaking  Started back on propofol  2/6 stable Apneic with PSV  2/8 Extubated; worsening agitation overnight, precedex restarted   Consults:  Neurology Cardiology  Procedures:  1/31 ETT >> 2/02 2/04 ETT >>  Significant Diagnostic Tests:  1/31 CT head/neck >> 30% ICA stenosis on the right, 20% on the left, 30-50% narrowing in both carotid siphon regions but no correctable proximal stenosis. 1/31 EEG >> moderate diffuse encephalopathy, no epileptiform activity 2/01 brain MRI >> Moderate cerebral atrophy with chronic microvascular ischemic disease, Soft tissue edema within the right occipital/suboccipital scalp. 2/01 LTM  >> moderate diffuse encephalopathy 2/05 Echo >>   cxr 07/04/19 IMPRESSION: Bibasilar infiltrates consistent with pneumonia or aspiration, RIGHT greater than LEFT, slightly increased on RIGHT since prior study  Micro Data:  1/31 SARS 2/ flu A/B >> neg 1/31 MRSA PCR >> negative 2/8 resp >>  Antimicrobials:  Unasyn 2/02 >> 2/8  Heparin started 2/4  Interim history/subjective:   Extubated yesterday  Worsening agitation overnight, restarted on precedex  Concern for airway edema overnight- treated with decadron/ racemic  epi Pulled out cortrak this am  Remains afebrile, on room air, ongoing agitation   Objective   Blood pressure (!) 107/58, pulse 66, temperature 98.5 F (36.9 C), temperature source Axillary, resp. rate 14, height 5\' 10"  (1.778 m), weight 71.9 kg, SpO2 94 %.        Intake/Output Summary (Last 24 hours) at 07/08/2019 1028 Last data filed at 07/08/2019 0800 Gross per 24 hour  Intake 2189.11 ml  Output 3150 ml  Net -960.89 ml   Filed Weights   07/03/19 0300 07/07/19 0354 07/08/19 0254  Weight: 65.5 kg 70.3 kg 71.9 kg   Examination:  General:  Elderly male in NAD lying in bed  HEENT: MM pink/dry, pupils/ 3/reactive Neuro: very HOH, sleepy, would not respond to me but for Dr. 09/05/19, opened eyes and follow simple commands, MAE CV: RR, no mumur PULM:  No stridor, not vocalizing, non labored on room air- observed several prolonged ~20 sec apneic episodes without any desaturation appearing to be central apnea but then at times he seemed to choke with tongue going back and then cough, looking like OSA, lungs clear anteriorly, diminished in bases  GI: soft, bs+, condom cath  Extremities: warm/dry, no LE edema, atrophy  Skin: no rashes    Resolved Hospital Problem list   Rhabdomyolysis  Assessment & Plan:   Acute hypoxic respiratory failure with compromised airway. - Failed extubation once, due to hypoxia and recurrent unexplained episodes of somnolence. - Not previously noted before, but today witnessedapneic episodes, ~20 seconds, without desaturation on room air- at times looks obstructed, then at others like central apnea - will order BiPAP prn with  back up rate, unclear if he will tolerate this given intermittent agitation  - check ABG to rule out hypercarbia  - will reorder MRI brain to reassess for any causes of central apnea and re-consult neuro  - not able to complete SLP given ongoing encephalopathy - will replace cortrak today / remains NPO  Concern for airway edema  -  continue decadron for 48 hours, may exacerbate agitation/ delirium  - monitor, no stridor currently   Aspiration pneumonia -completed 7 days of Unasyn 2/8 -remains afebrile/ normal WBC, high risk for aspiration - pending trach aspirate from 2/8  Acute metabolic encephalopathy - cause unclear Agitation/ possible delirium  - thus far, thought possibly related to seizure but since hospitalization, no clinical seizures observed, EEG negative thus far, imaging negative, some arrhythmias - aflutter/ Afib (initial event could have been a syncope), really unclear this at this point.  - continue keppra  - given above, will re-consult neuro and repeat MRI brain - precedex as needed, limit as able - avoid sedative medications   New onset Atrial flutter 2/4-spontaneous converted to sinus rhythm Episodes of bradycardia.  CHADSVASc =4 HTN, HLD. - cardiology signed off 2/9 - remains in sinus, continue to monitor for arrhythmias that may contribute his encephalopathy - continue ASA, lipitor when able - prn IV hydralazine for SBP > 170 - continue heparin gtt >> transition to oral Eliquis soon  Dysphagia with difficulty placing OG/NG tube. Severe protein calorie malnutrition. - Cortrak will hopefully be replaced today  - SLP when encephalopathy improved  DM type 2 poorly controlled. - continue SSI  B12 deficiency.  - B12 supplementation initiated 2/1  Hx of GERD. - continue protonix  Hypokalemia with hypomagnesemia - monitor with TF given malnutrition, phos remains ok  - will replete by IV for now then switch to PO when cortrak placed - mag 2 gm now - goal K >4, Mag > 2 - recheck in am    Normocytic anemia likely due to acute illness  - Hgb trend stable   Best practice:  Diet: tube feeds, restart when able  DVT prophylaxis: heparin gtt for now GI prophylaxis: PPI  Glucose control: monitor Mobility: bed Code Status: full Disposition: ICU   CCT 35 mins    Kennieth Rad,  MSN, AGACNP-BC [facility] 07/08/2019, 11:20 AM

## 2019-07-08 NOTE — Progress Notes (Signed)
Physical Therapy Treatment Patient Details Name: Seth Boyd MRN: [id number] DOB: 1935/05/04 Today's Date: 07/08/2019    History of Present Illness pt is an 85 y/o male PMHx: DM, HTN, admitted after found down on the floor by his son, making snoring sounds, eyes open and unable to talk. Stroke work-up- Imaging showing no acute neurological abnormality. Patient intubated 1/31-07/01/19 then re-intubated 2/4-07/07/19 with new onset a-fib.    PT Comments    Patient with limited progress this session due to increased confusion and now with restraints and had pulled his coretrack.  Feel he needs ongoing skilled PT while in acute setting for mobilization and progression of activity when tolerated.  Continues to be appropriate for CIR once medically stable.    Follow Up Recommendations  CIR;Supervision/Assistance - 24 hour     Equipment Recommendations  Other (comment)(TBA)    Recommendations for Other Services       Precautions / Restrictions Precautions Precautions: Fall    Mobility  Bed Mobility Overal bed mobility: Needs Assistance Bed Mobility: Supine to Sit;Sit to Supine     Supine to sit: Min assist Sit to supine: Mod assist   General bed mobility comments: pt with legs off bed already, assist for trunk to sit, to supine assist for legs on bed and positioning once supine  Transfers Overall transfer level: Needs assistance Equipment used: Rolling walker (2 wheeled) Transfers: Sit to/from Stand Sit to Stand: Mod assist         General transfer comment: up to stand with lifting help x 2 at EOB  Ambulation/Gait Ambulation/Gait assistance: Mod assist Gait Distance (Feet): 2 Feet Assistive device: Rolling walker (2 wheeled) Gait Pattern/deviations: Step-to pattern;Trunk flexed;Wide base of support;Decreased stride length;Shuffle     General Gait Details: cues and time to side step toward [facility] with RW, flexed trunk, very short shuffling steps with confusion   Stairs              Wheelchair Mobility    Modified Rankin (Stroke Patients Only)       Balance Overall balance assessment: Needs assistance Sitting-balance support: No upper extremity supported;Single extremity supported Sitting balance-Leahy Scale: Poor Sitting balance - Comments: can sit briefly with S, then starts leaning \\back  and to L, assist versus cues to come forward and to R Postural control: Posterior lean;Left lateral lean Standing balance support: Bilateral upper extremity supported Standing balance-Leahy Scale: Poor Standing balance comment: assist for balance, with UE support on walker                            Cognition Arousal/Alertness: Awake/alert Behavior During Therapy: Restless Overall Cognitive Status: Impaired/Different from baseline Area of Impairment: Attention;Following commands;Safety/judgement;Memory;Problem solving                   Current Attention Level: Focused Memory: Decreased short-term memory;Decreased recall of precautions Following Commands: Follows one step commands with increased time;Follows one step commands inconsistently Safety/Judgement: Decreased awareness of safety;Decreased awareness of deficits   Problem Solving: Slow processing General Comments: also limited due to hearing deficits      Exercises      General Comments General comments (skin integrity, edema, etc.): VSS on O2, tube feeding out already; pt in mitts, wrist and posey restraints      Pertinent Vitals/Pain Pain Assessment: No/denies pain    Home Living  Prior Function            PT Goals (current goals can now be found in the care plan section) Progress towards PT goals: Not progressing toward goals - comment(more confused today)    Frequency    Min 3X/week      PT Plan Current plan remains appropriate    Co-evaluation              AM-PAC PT "6 Clicks" Mobility   Outcome Measure  Help  needed turning from your back to your side while in a flat bed without using bedrails?: A Little Help needed moving from lying on your back to sitting on the side of a flat bed without using bedrails?: A Lot Help needed moving to and from a bed to a chair (including a wheelchair)?: A Lot Help needed standing up from a chair using your arms (e.g., wheelchair or bedside chair)?: A Lot Help needed to walk in hospital room?: A Lot Help needed climbing 3-5 steps with a railing? : Total 6 Click Score: 12    End of Session Equipment Utilized During Treatment: Oxygen Activity Tolerance: Patient limited by fatigue Patient left: in bed;with call bell/phone within reach;with bed alarm set;with restraints reapplied   PT Visit Diagnosis: Other abnormalities of gait and mobility (R26.89);Other symptoms and signs involving the nervous system (R29.898);Muscle weakness (generalized) (M62.81)     Time: 0950-1009 PT Time Calculation (min) (ACUTE ONLY): 19 min  Charges:  $Therapeutic Activity: 8-22 mins                     Sheran Lawless, PT Acute Rehabilitation Services [phone] 07/08/2019    Elray Mcgregor 07/08/2019, 12:03 PM

## 2019-07-08 NOTE — Anesthesia Procedure Notes (Signed)
Procedure Name: Intubation Date/Time: 07/08/2019 2:55 PM Performed by: Adria Dill, CRNA Pre-anesthesia Checklist: Patient identified, Emergency Drugs available, Suction available and Patient being monitored Patient Re-evaluated:Patient Re-evaluated prior to induction Oxygen Delivery Method: Ambu bag Preoxygenation: Pre-oxygenation with 100% oxygen Laryngoscope Size: Miller and 3 Grade View: Grade I Tube size: 7.5 mm Number of attempts: 1 Airway Equipment and Method: Stylet Placement Confirmation: ETT inserted through vocal cords under direct vision,  positive ETCO2 and breath sounds checked- equal and bilateral Secured at: 24 cm Tube secured with: Tape Dental Injury: Teeth and Oropharynx as per pre-operative assessment

## 2019-07-08 NOTE — Progress Notes (Signed)
Responded to code blue to support staff and patient.  Patient recovered and will be returning to room. No direct contact with patient.  Supported Civil engineer, contracting of presence and spiritual support. Will follow as needed.   Venida Jarvis, [place], [facility], Pager [phone]

## 2019-07-08 NOTE — Progress Notes (Addendum)
ANTICOAGULATION CONSULT NOTE   Pharmacy Consult for heparin Indication: atrial fibrillation   Assessment: 73 yoM admitted as code stroke with negative imaging. Pt developed AFib 2/4 during respiratory arrest requiring intubation. CHADSVASc = 4, no AC PTA. Will target normal heparin level goal given negative stroke workup.  Heparin level remains within goal. Cbc stable. No active bleed issues reported.  Goal of Therapy:  Heparin level 0.3-0.7 units/ml Monitor platelets by anticoagulation protocol: Yes   Plan:  Continue heparin at 800 units/hr Monitor daily heparin level and CBC, s/sx bleeding  Jeanella Cara, PharmD, [facility] Clinical Pharmacist Please see AMION for all Pharmacists' Contact Phone Numbers 07/08/2019, 10:46 AM

## 2019-07-08 NOTE — Progress Notes (Addendum)
   The patient has been seen in conjunction with Theodore Demark, PA-C. All aspects of care have been considered and discussed. The patient has been personally interviewed, examined, and all clinical data has been reviewed.   Patient seen just prior to PEG tube placement.  No specific recommendations.  Agree with reinstitution of anticoagulation when safe.  Agree with decision to sign off at this time.  Pt maintaining SR.  Anticoagulate when ok w/ CCM, Neuro.  No further cardiac workup indicated   [facility] will sign off.   Medication Recommendations:  Continue current therapy, d/c ASA once Eliquis started.  Other recommendations (labs, testing, etc):  none Follow up as an outpatient:  Prn  Theodore Demark, PA-C 07/08/2019 9:47 AM

## 2019-07-08 NOTE — Code Documentation (Signed)
CODE BLUE NOTE  Patient Name: Seth Boyd   MRN: [id number]   Date of Birth/ Sex: [birth year] , male      Admission Date: 05/30/2019  Attending Provider: Oretha Milch, MD  Primary Diagnosis: CVA (cerebral vascular accident) [facility])    Indication: Pt was in his usual state of health until this 1450 PM.  During MRI head he became bradycardic which lead to PEA. Code blue was subsequently called. At the time of arrival on scene, ACLS protocol was underway. ROSC achieved 1500    Technical Description:  - CPR performance duration:  3 minutes  - Was defibrillation or cardioversion used? No   - Was external pacer placed? No  - Was patient intubated pre/post CPR? No    Medications Administered: Y = Yes; Blank = No Amiodarone    Atropine    Calcium    Epinephrine    Lidocaine    Magnesium    Norepinephrine    Phenylephrine    Sodium bicarbonate    Vasopressin      Post CPR evaluation:  - Final Status - Was patient successfully resuscitated ? Yes - What is current rhythm? Afib - What is current hemodynamic status? Stable   Miscellaneous Information:  - Labs sent, including: None  - Primary team notified?  Yes  - Family Notified? Yes  - Additional notes/ transfer status: Please call family to update        Dana Allan, MD  07/08/2019, 3:09 PM

## 2019-07-08 NOTE — Progress Notes (Signed)
eLink Physician-Brief Progress Note Patient Name: Seth Boyd DOB: 1934-05-30 MRN: [id number]   Date of Service  07/08/2019  HPI/Events of Note  Pt was recently extubated, he is hoarse and barely audible and it has gotten progressively worse raising concern for airway edema per bedside RN, Pt has a history of re-intubation for airway compromise s/p extubation.  eICU Interventions  Decadron 6 mg iv Q 6 hours x 2 doses, Racemic epinephrine neb Rx x 1 now        [employer] 07/08/2019, 4:33 AM

## 2019-07-09 ENCOUNTER — Inpatient Hospital Stay (HOSPITAL_COMMUNITY): Payer: Medicare HMO

## 2019-07-09 DIAGNOSIS — A419 Sepsis, unspecified organism: Secondary | ICD-10-CM

## 2019-07-09 DIAGNOSIS — J690 Pneumonitis due to inhalation of food and vomit: Secondary | ICD-10-CM | POA: Diagnosis present

## 2019-07-09 DIAGNOSIS — R6521 Severe sepsis with septic shock: Secondary | ICD-10-CM

## 2019-07-09 LAB — URINALYSIS, ROUTINE W REFLEX MICROSCOPIC
Bilirubin Urine: NEGATIVE
Glucose, UA: 150 mg/dL — AB
Ketones, ur: NEGATIVE mg/dL
Leukocytes,Ua: NEGATIVE
Nitrite: NEGATIVE
Protein, ur: 30 mg/dL — AB
RBC / HPF: >50 RBC/hpf — ABNORMAL HIGH (ref 0–5)
Specific Gravity, Urine: 1.021 (ref 1.005–1.030)
pH: 5 (ref 5.0–8.0)

## 2019-07-09 LAB — POCT I-STAT 7, (LYTES, BLD GAS, ICA,H+H)
Acid-base deficit: 15 mmol/L — ABNORMAL HIGH (ref 0.0–2.0)
Acid-base deficit: 3 mmol/L — ABNORMAL HIGH (ref 0.0–2.0)
Bicarbonate: 12.5 mmol/L — ABNORMAL LOW (ref 20.0–28.0)
Bicarbonate: 20 mmol/L (ref 20.0–28.0)
Calcium, Ion: 1.11 mmol/L — ABNORMAL LOW (ref 1.15–1.40)
Calcium, Ion: 1.04 mmol/L — ABNORMAL LOW (ref 1.15–1.40)
HCT: 15 % — ABNORMAL LOW (ref 39.0–52.0)
HCT: 23 % — ABNORMAL LOW (ref 39.0–52.0)
Hemoglobin: 5.1 g/dL — CL (ref 13.0–17.0)
Hemoglobin: 7.8 g/dL — ABNORMAL LOW (ref 13.0–17.0)
O2 Saturation: 96 %
O2 Saturation: 97 %
Patient temperature: 98.4
Patient temperature: 37.7
Potassium: 5.4 mmol/L — ABNORMAL HIGH (ref 3.5–5.1)
Potassium: 4.4 mmol/L (ref 3.5–5.1)
Sodium: 143 mmol/L (ref 135–145)
Sodium: 145 mmol/L (ref 135–145)
TCO2: 14 mmol/L — ABNORMAL LOW (ref 22–32)
TCO2: 21 mmol/L — ABNORMAL LOW (ref 22–32)
pCO2 arterial: 35.7 mmHg (ref 32.0–48.0)
pCO2 arterial: 26.7 mmHg — ABNORMAL LOW (ref 32.0–48.0)
pH, Arterial: 7.151 — CL (ref 7.350–7.450)
pH, Arterial: 7.485 — ABNORMAL HIGH (ref 7.350–7.450)
pO2, Arterial: 100 mmHg (ref 83.0–108.0)
pO2, Arterial: 85 mmHg (ref 83.0–108.0)

## 2019-07-09 LAB — COMPREHENSIVE METABOLIC PANEL
ALT: 95 U/L — ABNORMAL HIGH (ref 0–44)
ALT: 932 U/L — ABNORMAL HIGH (ref 0–44)
AST: 113 U/L — ABNORMAL HIGH (ref 15–41)
AST: 1074 U/L — ABNORMAL HIGH (ref 15–41)
Albumin: 1.6 g/dL — ABNORMAL LOW (ref 3.5–5.0)
Albumin: 1.6 g/dL — ABNORMAL LOW (ref 3.5–5.0)
Alkaline Phosphatase: 79 U/L (ref 38–126)
Alkaline Phosphatase: 76 U/L (ref 38–126)
Anion gap: 21 — ABNORMAL HIGH (ref 5–15)
Anion gap: 20 — ABNORMAL HIGH (ref 5–15)
BUN: 32 mg/dL — ABNORMAL HIGH (ref 8–23)
BUN: 31 mg/dL — ABNORMAL HIGH (ref 8–23)
CO2: 14 mmol/L — ABNORMAL LOW (ref 22–32)
CO2: 28 mmol/L (ref 22–32)
Calcium: 7.9 mg/dL — ABNORMAL LOW (ref 8.9–10.3)
Calcium: 7.2 mg/dL — ABNORMAL LOW (ref 8.9–10.3)
Chloride: 108 mmol/L (ref 98–111)
Chloride: 109 mmol/L (ref 98–111)
Creatinine, Ser: 1.74 mg/dL — ABNORMAL HIGH (ref 0.61–1.24)
Creatinine, Ser: 1.65 mg/dL — ABNORMAL HIGH (ref 0.61–1.24)
GFR calc Af Amer: 41 mL/min — ABNORMAL LOW (ref >60)
GFR calc Af Amer: 44 mL/min — ABNORMAL LOW (ref >60)
GFR calc non Af Amer: 36 mL/min — ABNORMAL LOW (ref >60)
GFR calc non Af Amer: 38 mL/min — ABNORMAL LOW (ref >60)
Glucose, Bld: 487 mg/dL — ABNORMAL HIGH (ref 70–99)
Glucose, Bld: 182 mg/dL — ABNORMAL HIGH (ref 70–99)
Potassium: 5 mmol/L (ref 3.5–5.1)
Potassium: 4.7 mmol/L (ref 3.5–5.1)
Sodium: 143 mmol/L (ref 135–145)
Sodium: 157 mmol/L — ABNORMAL HIGH (ref 135–145)
Total Bilirubin: 0.9 mg/dL (ref 0.3–1.2)
Total Bilirubin: 0.8 mg/dL (ref 0.3–1.2)
Total Protein: 4 g/dL — ABNORMAL LOW (ref 6.5–8.1)
Total Protein: 3.8 g/dL — ABNORMAL LOW (ref 6.5–8.1)

## 2019-07-09 LAB — CBC
HCT: 15.9 % — ABNORMAL LOW (ref 39.0–52.0)
Hemoglobin: 5.1 g/dL — CL (ref 13.0–17.0)
MCH: 30 pg (ref 26.0–34.0)
MCHC: 32.1 g/dL (ref 30.0–36.0)
MCV: 93.5 fL (ref 80.0–100.0)
Platelets: 289 10*3/uL (ref 150–400)
RBC: 1.7 MIL/uL — ABNORMAL LOW (ref 4.22–5.81)
RDW: 13.3 % (ref 11.5–15.5)
WBC: 26.9 10*3/uL — ABNORMAL HIGH (ref 4.0–10.5)
nRBC: 0.8 % — ABNORMAL HIGH (ref 0.0–0.2)

## 2019-07-09 LAB — GLUCOSE, CAPILLARY
Glucose-Capillary: 108 mg/dL — ABNORMAL HIGH (ref 70–99)
Glucose-Capillary: 115 mg/dL — ABNORMAL HIGH (ref 70–99)
Glucose-Capillary: 117 mg/dL — ABNORMAL HIGH (ref 70–99)
Glucose-Capillary: 120 mg/dL — ABNORMAL HIGH (ref 70–99)
Glucose-Capillary: 141 mg/dL — ABNORMAL HIGH (ref 70–99)
Glucose-Capillary: 159 mg/dL — ABNORMAL HIGH (ref 70–99)
Glucose-Capillary: 217 mg/dL — ABNORMAL HIGH (ref 70–99)
Glucose-Capillary: 263 mg/dL — ABNORMAL HIGH (ref 70–99)
Glucose-Capillary: 347 mg/dL — ABNORMAL HIGH (ref 70–99)
Glucose-Capillary: 349 mg/dL — ABNORMAL HIGH (ref 70–99)
Glucose-Capillary: 355 mg/dL — ABNORMAL HIGH (ref 70–99)

## 2019-07-09 LAB — LACTIC ACID, PLASMA
Lactic Acid, Venous: >11 mmol/L (ref 0.5–1.9)
Lactic Acid, Venous: >11 mmol/L (ref 0.5–1.9)
Lactic Acid, Venous: >11 mmol/L (ref 0.5–1.9)

## 2019-07-09 LAB — RENAL FUNCTION PANEL
Albumin: 2 g/dL — ABNORMAL LOW (ref 3.5–5.0)
Anion gap: 15 (ref 5–15)
BUN: 29 mg/dL — ABNORMAL HIGH (ref 8–23)
CO2: 24 mmol/L (ref 22–32)
Calcium: 8.3 mg/dL — ABNORMAL LOW (ref 8.9–10.3)
Chloride: 106 mmol/L (ref 98–111)
Creatinine, Ser: 1.16 mg/dL (ref 0.61–1.24)
GFR calc Af Amer: >60 mL/min (ref >60)
GFR calc non Af Amer: 58 mL/min — ABNORMAL LOW (ref >60)
Glucose, Bld: 426 mg/dL — ABNORMAL HIGH (ref 70–99)
Phosphorus: 3.1 mg/dL (ref 2.5–4.6)
Potassium: 4.5 mmol/L (ref 3.5–5.1)
Sodium: 145 mmol/L (ref 135–145)

## 2019-07-09 LAB — APTT: aPTT: 56 seconds — ABNORMAL HIGH (ref 24–36)

## 2019-07-09 LAB — HEPARIN LEVEL (UNFRACTIONATED): Heparin Unfractionated: 0.7 IU/mL (ref 0.30–0.70)

## 2019-07-09 LAB — PROTIME-INR
INR: 2.7 — ABNORMAL HIGH (ref 0.8–1.2)
Prothrombin Time: 28.6 seconds — ABNORMAL HIGH (ref 11.4–15.2)

## 2019-07-09 LAB — MAGNESIUM
Magnesium: 1.9 mg/dL (ref 1.7–2.4)
Magnesium: 2.4 mg/dL (ref 1.7–2.4)

## 2019-07-09 LAB — PREPARE RBC (CROSSMATCH)

## 2019-07-09 LAB — BETA-HYDROXYBUTYRIC ACID: Beta-Hydroxybutyric Acid: 0.24 mmol/L (ref 0.05–0.27)

## 2019-07-09 LAB — ABO/RH: ABO/RH(D): A NEG

## 2019-07-09 IMAGING — DX DG CHEST 1V PORT
1 series · 1 of 1 positions shown · non-contrast
Comparison: Chest radiograph [DATE]

CLINICAL DATA: Acute respiratory failure.

EXAM:
PORTABLE CHEST 1 VIEW

[chest ap]
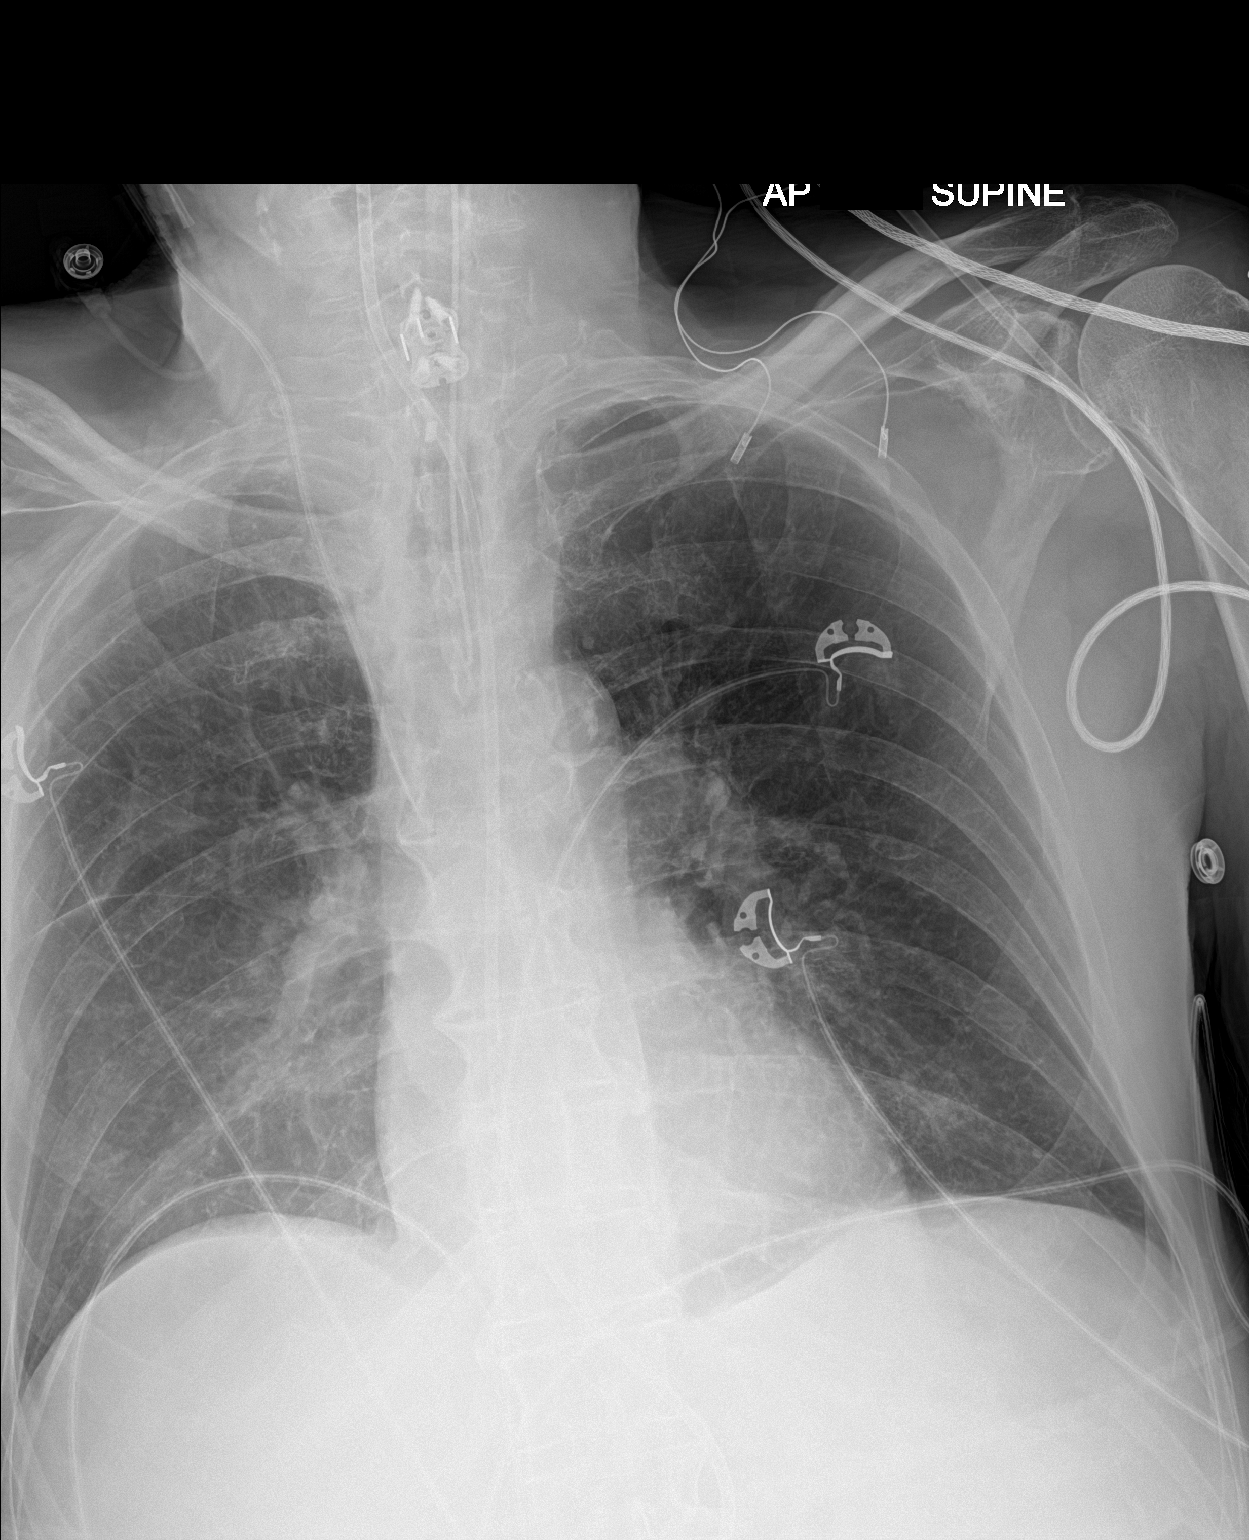

[1 of 1 positions shown; findings below may reference images not displayed]

FINDINGS: Interval placement of a right central venous catheter with tip
projecting over the distal SVC. Otherwise stable support apparatus.
Stable cardiomediastinal contours. Normal heart size. The lungs are
clear. No pneumothorax or large pleural effusion. No acute finding
in the visualized skeleton.
IMPRESSION: No acute finding.  Support apparatus in appropriate position.

## 2019-07-09 MED ORDER — SODIUM BICARBONATE 8.4 % IV SOLN: 100.0000 meq | Freq: Once | INTRAVENOUS | Status: AC

## 2019-07-09 MED ORDER — SODIUM CHLORIDE 0.9% FLUSH: 10.0000 mL | INTRAVENOUS | Status: DC | PRN

## 2019-07-09 MED ORDER — LACTATED RINGERS IV BOLUS
500.0000 mL | Freq: Once | INTRAVENOUS | Status: AC
  Administered 2019-07-09: 500 mL via INTRAVENOUS

## 2019-07-09 MED ORDER — PIPERACILLIN-TAZOBACTAM 3.375 G IVPB
3.3750 g | Freq: Three times a day (TID) | INTRAVENOUS | Status: DC
  Administered 2019-07-09 – 2019-07-10 (×4): 3.375 g via INTRAVENOUS
  Filled 2019-07-09 (×4): qty 50

## 2019-07-09 MED ORDER — MAGNESIUM SULFATE 2 GM/50ML IV SOLN
2.0000 g | Freq: Once | INTRAVENOUS | Status: AC
  Administered 2019-07-09: 2 g via INTRAVENOUS
  Filled 2019-07-09: qty 50

## 2019-07-09 MED ORDER — INSULIN DETEMIR 100 UNIT/ML ~~LOC~~ SOLN
5.0000 [IU] | Freq: Two times a day (BID) | SUBCUTANEOUS | Status: DC
  Administered 2019-07-09 – 2019-07-10 (×3): 5 [IU] via SUBCUTANEOUS
  Filled 2019-07-09 (×5): qty 0.05

## 2019-07-09 MED ORDER — SODIUM CHLORIDE 0.9% FLUSH
10.0000 mL | Freq: Two times a day (BID) | INTRAVENOUS | Status: DC
  Administered 2019-07-09: 30 mL
  Administered 2019-07-10 – 2019-07-11 (×3): 20 mL
  Administered 2019-07-11 – 2019-07-15 (×8): 10 mL
  Administered 2019-07-16: 20 mL
  Administered 2019-07-16: 10 mL
  Administered 2019-07-17: 20 mL
  Administered 2019-07-17 – 2019-07-21 (×9): 10 mL

## 2019-07-09 MED ORDER — PHENYLEPHRINE CONCENTRATED 100MG/250ML (0.4 MG/ML) INFUSION SIMPLE
25.0000 ug/min | INTRAVENOUS | Status: DC
  Administered 2019-07-09: 130 ug/min via INTRAVENOUS
  Filled 2019-07-09: qty 250

## 2019-07-09 MED ORDER — CALCIUM GLUCONATE-NACL 1-0.675 GM/50ML-% IV SOLN
1.0000 g | Freq: Once | INTRAVENOUS | Status: AC
  Administered 2019-07-09: 1000 mg via INTRAVENOUS
  Filled 2019-07-09: qty 50

## 2019-07-09 MED ORDER — PROTAMINE SULFATE 10 MG/ML IV SOLN
25.0000 mg | INTRAVENOUS | Status: DC
  Filled 2019-07-09: qty 2.5

## 2019-07-09 MED ORDER — EPINEPHRINE 1 MG/10ML IJ SOSY
0.5000 mg | PREFILLED_SYRINGE | Freq: Once | INTRAMUSCULAR | Status: AC
  Administered 2019-07-09: 0.5 mg via INTRAVENOUS

## 2019-07-09 MED ORDER — NOREPINEPHRINE 4 MG/250ML-% IV SOLN
INTRAVENOUS | Status: AC
  Administered 2019-07-09: 4 mg
  Filled 2019-07-09: qty 250

## 2019-07-09 MED ORDER — INSULIN ASPART 100 UNIT/ML ~~LOC~~ SOLN
2.0000 [IU] | SUBCUTANEOUS | Status: DC
  Administered 2019-07-10 (×3): 4 [IU] via SUBCUTANEOUS
  Administered 2019-07-10: 2 [IU] via SUBCUTANEOUS
  Administered 2019-07-10: 4 [IU] via SUBCUTANEOUS
  Administered 2019-07-11 (×3): 2 [IU] via SUBCUTANEOUS

## 2019-07-09 MED ORDER — NOREPINEPHRINE 16 MG/250ML-% IV SOLN
0.0000 ug/min | INTRAVENOUS | Status: DC
  Administered 2019-07-09: 40 ug/min via INTRAVENOUS
  Administered 2019-07-10: 5 ug/min via INTRAVENOUS
  Filled 2019-07-09: qty 250

## 2019-07-09 MED ORDER — SODIUM BICARBONATE 8.4 % IV SOLN
50.0000 meq | Freq: Once | INTRAVENOUS | Status: AC
  Administered 2019-07-09: 50 meq via INTRAVENOUS

## 2019-07-09 MED ORDER — SODIUM CHLORIDE 0.9% IV SOLUTION: Freq: Once | INTRAVENOUS | Status: DC

## 2019-07-09 MED ORDER — SODIUM CHLORIDE 0.9 % IV SOLN: Freq: Once | INTRAVENOUS | Status: AC

## 2019-07-09 MED ORDER — STERILE WATER FOR INJECTION IV SOLN
INTRAVENOUS | Status: DC
  Filled 2019-07-09 (×4): qty 850

## 2019-07-09 MED ORDER — DEXTROSE 50 % IV SOLN: 0.0000 mL | INTRAVENOUS | Status: DC | PRN

## 2019-07-09 MED ORDER — SODIUM CHLORIDE 0.9 % IV SOLN: INTRAVENOUS | Status: DC

## 2019-07-09 MED ORDER — NOREPINEPHRINE 4 MG/250ML-% IV SOLN
0.0000 ug/min | INTRAVENOUS | Status: DC
  Administered 2019-07-09: 40 ug/min via INTRAVENOUS
  Administered 2019-07-09 (×2): 20 ug/min via INTRAVENOUS
  Filled 2019-07-09: qty 250
  Filled 2019-07-09: qty 500

## 2019-07-09 MED ORDER — PROTAMINE SULFATE 10 MG/ML IV SOLN
12.0000 mg | INTRAVENOUS | Status: AC
  Administered 2019-07-09: 12 mg via INTRAVENOUS
  Filled 2019-07-09: qty 1.2

## 2019-07-09 MED ORDER — ALBUMIN HUMAN 5 % IV SOLN
INTRAVENOUS | Status: AC
  Filled 2019-07-09: qty 250

## 2019-07-09 MED ORDER — INSULIN REGULAR(HUMAN) IN NACL 100-0.9 UT/100ML-% IV SOLN
INTRAVENOUS | Status: AC
  Administered 2019-07-09: 5.5 [IU]/h via INTRAVENOUS
  Filled 2019-07-09: qty 100

## 2019-07-09 MED ORDER — PHENYLEPHRINE HCL-NACL 10-0.9 MG/250ML-% IV SOLN
25.0000 ug/min | INTRAVENOUS | Status: DC
  Administered 2019-07-09: 25 ug/min via INTRAVENOUS
  Administered 2019-07-09: 200 ug/min via INTRAVENOUS
  Filled 2019-07-09: qty 250
  Filled 2019-07-09: qty 500
  Filled 2019-07-09: qty 250
  Filled 2019-07-09: qty 500

## 2019-07-09 MED ORDER — SODIUM CHLORIDE 0.9 % IV SOLN: 250.0000 mL | INTRAVENOUS | Status: DC

## 2019-07-09 MED ORDER — INSULIN DETEMIR 100 UNIT/ML ~~LOC~~ SOLN
6.0000 [IU] | Freq: Every day | SUBCUTANEOUS | Status: DC
  Filled 2019-07-09: qty 0.06

## 2019-07-09 MED ORDER — VASOPRESSIN 20 UNIT/ML IV SOLN
0.0300 [IU]/min | INTRAVENOUS | Status: DC
  Administered 2019-07-09: 0.03 [IU]/min via INTRAVENOUS
  Filled 2019-07-09: qty 2

## 2019-07-09 MED ORDER — INSULIN ASPART 100 UNIT/ML ~~LOC~~ SOLN
2.0000 [IU] | SUBCUTANEOUS | Status: DC
  Administered 2019-07-09 (×2): 2 [IU] via SUBCUTANEOUS

## 2019-07-09 MED ORDER — DEXTROSE-NACL 5-0.45 % IV SOLN: INTRAVENOUS | Status: DC

## 2019-07-09 MED ORDER — LACTATED RINGERS IV BOLUS
1000.0000 mL | Freq: Once | INTRAVENOUS | Status: AC
  Administered 2019-07-09: 1000 mL via INTRAVENOUS

## 2019-07-09 MED ORDER — VANCOMYCIN HCL 750 MG/150ML IV SOLN
750.0000 mg | INTRAVENOUS | Status: DC
  Administered 2019-07-09: 750 mg via INTRAVENOUS
  Filled 2019-07-09 (×2): qty 150

## 2019-07-09 MED ORDER — INSULIN ASPART 100 UNIT/ML ~~LOC~~ SOLN
0.0000 [IU] | SUBCUTANEOUS | Status: DC
  Administered 2019-07-09 (×2): 15 [IU] via SUBCUTANEOUS

## 2019-07-09 NOTE — Progress Notes (Signed)
ABG not crossing over:  PH 7.46, PCO2 19.2, PO2 192, Bicarb 14

## 2019-07-09 NOTE — Progress Notes (Addendum)
Worsening shock -no response to a liter fluid bolus, started on Neo-Synephrine and titrated, bedside echo shows hyperdynamic LV, poor windows ABG shows compensated metabolic acidosis Lactate more than 11 Continues high more than 400, beta hydroxybutyrate was normal -started on insulin drip Labs show mild hyperkalemia and worsening creatinine s/o  AKI, likely related to cardiac arrest Abdomen appears soft and benign, bowel ischemia remains in the differential Central line placed   Will Follow-up lactate Heparin was stopped due to worsening mental status, Too unstable to transport for repeat head CT, discussed with neurology  Presumptive diagnosis is gram-negative pneumonia, Enterobacter noted in respiratory culture-started broad-spectrum antibiotics Zosyn and vancomycin  Son [name] updated  Additional critical care time independent of procedures  X 65m  Simranjit Thayer V. Vassie Loll MD

## 2019-07-09 NOTE — Progress Notes (Addendum)
Reason for consult: Encephalopathy  Subjective: Patient has been gradually more unresponsive this morning, apparently following commands last night.  Patient's temperature is also blood pressure he appears to be in shock.  His neurological exam deteriorated, bilateral extremities are flaccid with no withdrawal. Continues EEG monitoring does not show any seizures  ROS: Unable to obtain due to poor mental status  Examination  Vital signs in last 24 hours: Temp:  [97.3 F (36.3 C)-98.5 F (36.9 C)] 97.3 F (36.3 C) (02/10 1600) Pulse Rate:  [65-114] 94 (02/10 1611) Resp:  [14-30] 26 (02/10 1611) BP: (67-170)/(46-73) 152/60 (02/10 1611) SpO2:  [95 %-100 %] 100 % (02/10 1611) FiO2 (%):  [30 %-40 %] 30 % (02/10 1611) Weight:  [68.3 kg] 68.3 kg (02/10 0351)  General: lying in bed CVS: pulse-normal rate and rhythm RS: breathing comfortably Extremities: normal   Neuro: Mental Status: Patient does not respond to verbal stimuli.  Does not respond to deep sternal rub.  Does not follow commands.  No verbalizations noted.  Cranial Nerves: II: Pupils are 3 mm bilaterally sluggish III,IV,VI: doll's response absent  VIII: patient does not respond to verbal stimuli IX,X: gag reflex present XI: trapezius strength unable to test bilaterally XII: tongue strength unable to test Motor: Extremities flaccid throughout.  Sensory: Does not respond to noxious stimuli in any extremity. Deep Tendon Reflexes:  Absent throughout. Plantars: Mute Cerebellar: Unable to perform  Basic Metabolic Panel: Recent Labs  Lab 07/03/19 0353 07/03/19 0422 07/08/19 0404 07/08/19 1143 07/08/19 1520 07/08/19 1520 07/08/19 1612 07/08/19 1943 07/08/19 2219 07/09/19 0630 07/09/19 1241  NA 143   < > 144   < > 143   < > 141 144 143 145 143  K 3.3*   < > 2.6*   < > 3.1*   < > 4.5 3.7 4.6 4.5 5.0  CL 105   < > 99  --  103  --   --   --  105 106 108  CO2 27   < > 31  --  26  --   --   --  28 24 14*  GLUCOSE  169*   < > 221*  --  194*  --   --   --  255* 426* 487*  BUN 23   < > 15  --  15  --   --   --  19 29* 32*  CREATININE 0.73   < > 0.58*  --  0.62  --   --   --  0.58* 1.16 1.74*  CALCIUM 8.8*   < > 8.9  --  8.7*   < >  --   --  8.5* 8.3* 7.9*  MG 2.0  --  1.5*  --   --   --   --   --   --  1.9  --   PHOS 3.5  --  2.6  --  3.4  --   --   --   --  3.1  --    < > = values in this interval not displayed.    CBC: Recent Labs  Lab 07/05/19 0341 07/05/19 0341 07/06/19 0159 07/06/19 0159 07/07/19 0528 07/07/19 0528 07/08/19 0404 07/08/19 1143 07/08/19 1520 07/08/19 1612 07/08/19 1943  WBC 8.5  --  5.7  --  6.7  --  6.0  --  11.0*  --   --   HGB 11.3*   < > 10.2*   < > 9.6*   < >  10.9* 10.5* 10.8* 10.2* 15.6  HCT 35.4*   < > 31.8*   < > 29.7*   < > 34.4* 31.0* 34.1* 30.0* 46.0  MCV 90.8  --  90.1  --  90.5  --  91.7  --  92.2  --   --   PLT 233  --  242  --  224  --  272  --  365  --   --    < > = values in this interval not displayed.     Coagulation Studies: No results for input(s): LABPROT, INR in the last 72 hours.   EEG: This study  issuggestive of moderate diffuse encephalopathy,non specific to etiology.No seizures or epileptiform discharges were seen throughout the recording.   ASSESSMENT AND PLAN  85 year old male with diabetes mellitus presented with aphasia and flaccid right-sided weakness on 1/31.  Was subsequently intubated due to lethargic state.  MRI brain was negative for acute stroke.  Initial EEG as well as long-term EEG negative for epileptiform discharges. Patient was extubated on 2/2 and was following simple commands.  Patient was continued on Keppra 500 mg twice daily.  Neurology signed off and plan to involve as outpatient.  Since then, patient would have unexplained episodes of hypoxia as well as somnolence.  Also developed new onset atrial flutter with episodes of bradycardia.  Was reconsulted today after the patient was having episodes of apnea when he  became somnolent, on my assessment patient was awake and following commands.  ABG not suggestive of hypercarbia therefore making myasthenia gravis/hypercarbic respiratory failure unlikely.  Patient was being taken for repeat MRI brain, suddenly had PEA arrest and was coded.  3 minutes, patient intubated but now following commands.  Long-term EEG placed to look for subclinical seizures, none detected so far.  Suspect current neurological deterioration in the setting of hypoxia, worsening shock and elevated lactate.   Acute metabolic encephalopathy Suspected seizure on presentation Unexplained episodes of apnea PEA arrest Shock  Acute kidney injury Elevated liver enzymes due to shock liver Metabolic acidosis Gram-negative pneumonia-Enterobacter in respiratory culture    Recommendations Stop heparin drip and obtain CT head when possible to r/o hemorrhage for neurological decline although suspect it is reflective of patient currently being in shock Management of shock, hyperglycemia per CCM, agree with evaluation for ischemic bowel   CRITICAL CARE Performed by: Lanice Schwab Admiral Marcucci   Total critical care time: 35  minutes  Critical care time was exclusive of separately billable procedures and treating other patients.  Critical care was necessary to treat or prevent imminent or life-threatening deterioration.  Critical care was time spent personally by me on the following activities: development of treatment plan with patient and/or surrogate as well as nursing, discussions with consultants, evaluation of patient's response to treatment, examination of patient, obtaining history from patient or surrogate, ordering and performing treatments and interventions, ordering and review of laboratory studies, ordering and review of radiographic studies, pulse oximetry and re-evaluation of patient's condition.   Karena Addison Corena Tilson [facility] Pager Number [phone] For questions  after 7pm please refer to AMION to reach the Neurologist on call

## 2019-07-09 NOTE — Progress Notes (Addendum)
The patient has been seen in conjunction with Cecilie Kicks, NP. All aspects of care have been considered and discussed. The patient has been personally interviewed, examined, and all clinical data has been reviewed.   Agree with this note as outlined below.  No new recommendations.   Progress Note  Patient Name: Seth Boyd Date of Encounter: 07/09/2019  Primary Cardiologist: Mertie Moores, MD   Subjective   Intubated and sedated and on EEG  Inpatient Medications    Scheduled Meds: . aspirin  81 mg Per Tube Daily  . atorvastatin  40 mg Per Tube QHS  . chlorhexidine gluconate (MEDLINE KIT)  15 mL Mouth Rinse BID  . Chlorhexidine Gluconate Cloth  6 each Topical Daily  . feeding supplement (PRO-STAT SUGAR FREE 64)  30 mL Per Tube Daily  . insulin aspart  0-15 Units Subcutaneous Q4H  . mouth rinse  15 mL Mouth Rinse 10 times per day  . pantoprazole sodium  40 mg Per Tube Q1200  . vitamin B-12  2,000 mcg Per Tube Daily   Continuous Infusions: . sodium chloride 10 mL/hr at 07/09/19 0700  . feeding supplement (OSMOLITE 1.2 CAL) 1,000 mL (07/08/19 2014)  . heparin 800 Units/hr (07/09/19 0700)  . levETIRAcetam Stopped (07/08/19 2334)  . magnesium sulfate bolus IVPB    . piperacillin-tazobactam (ZOSYN)  IV     PRN Meds: sodium chloride, acetaminophen, acetaminophen, bisacodyl, docusate, fentaNYL (SUBLIMAZE) injection, fentaNYL (SUBLIMAZE) injection, hydrALAZINE   Vital Signs    Vitals:   07/09/19 0600 07/09/19 0700 07/09/19 0745 07/09/19 0800  BP: 128/62 (!) 105/58 111/61 107/64  Pulse: 86  (!) 109   Resp: (!) 26 (!) 27 (!) 24 (!) 22  Temp:    98 F (36.7 C)  TempSrc:    Axillary  SpO2: 100%  100%   Weight:      Height:        Intake/Output Summary (Last 24 hours) at 07/09/2019 0918 Last data filed at 07/09/2019 0700 Gross per 24 hour  Intake 1941.47 ml  Output 875 ml  Net 1066.47 ml   Last 3 Weights 07/09/2019 07/08/2019 07/07/2019  Weight (lbs) 150 lb 9.2 oz  158 lb 8.2 oz 154 lb 15.7 oz  Weight (kg) 68.3 kg 71.9 kg 70.3 kg      Telemetry    SR to ST at times difficult to see P waves - Personally Reviewed  ECG    Yesterday a flutter with varying block - Personally Reviewed  Physical Exam   GEN: No acute distress.  Sedated and on vent   Neck: No JVD Cardiac: RRR, no murmurs, rubs, or gallops.  Respiratory: Clear to auscultation bilaterally. GI: Soft, nontender, non-distended  MS: No edema; No deformity. Neuro:  Nonfocal  Psych: Normal affect   Labs    High Sensitivity Troponin:   Recent Labs  Lab 07/08/19 1520  TROPONINIHS 18*      Chemistry Recent Labs  Lab 07/08/19 1520 07/08/19 1612 07/08/19 1943 07/08/19 2219 07/09/19 0630  NA 143   < > 144 143 145  K 3.1*   < > 3.7 4.6 4.5  CL 103  --   --  105 106  CO2 26  --   --  28 24  GLUCOSE 194*  --   --  255* 426*  BUN 15  --   --  19 29*  CREATININE 0.62  --   --  0.58* 1.16  CALCIUM 8.7*  --   --  8.5* 8.3*  ALBUMIN 2.4*  --   --   --  2.0*  GFRNONAA >60  --   --  >60 58*  GFRAA >60  --   --  >60 >60  ANIONGAP 14  --   --  10 15   < > = values in this interval not displayed.     Hematology Recent Labs  Lab 07/07/19 0528 07/07/19 0528 07/08/19 0404 07/08/19 1143 07/08/19 1520 07/08/19 1612 07/08/19 1943  WBC 6.7  --  6.0  --  11.0*  --   --   RBC 3.28*  --  3.75*  --  3.70*  --   --   HGB 9.6*   < > 10.9*   < > 10.8* 10.2* 15.6  HCT 29.7*   < > 34.4*   < > 34.1* 30.0* 46.0  MCV 90.5  --  91.7  --  92.2  --   --   MCH 29.3  --  29.1  --  29.2  --   --   MCHC 32.3  --  31.7  --  31.7  --   --   RDW 12.6  --  12.8  --  12.7  --   --   PLT 224  --  272  --  365  --   --    < > = values in this interval not displayed.    BNPNo results for input(s): BNP, PROBNP in the last 168 hours.   DDimer No results for input(s): DDIMER in the last 168 hours.   Radiology    MR BRAIN WO CONTRAST  Result Date: 07/08/2019 CLINICAL DATA:  Encephalopathy. EXAM: MRI  HEAD WITHOUT CONTRAST TECHNIQUE: Multiplanar, multiecho pulse sequences of the brain and surrounding structures were obtained without intravenous contrast. COMPARISON:  MRI of the brain June 30, 2019. FINDINGS: This study is degraded by motion. Brain: No acute infarction, hemorrhage, extra-axial collection or mass lesion. Scattered and confluent foci of T2 hyperintensity are seen within the white matter of the cerebral hemispheres, nonspecific, most likely related to chronic small vessel ischemia, unchanged from prior MRI. Foci of susceptibility artifact are noted in the right frontal level and left occipital lobe, likely represent hemosiderin deposits, unchanged from prior MRI. There is marked prominence of the ventricular system, cerebral and cerebellar sulci, consistent with parenchymal volume loss. Vascular: Normal flow voids. Skull and upper cervical spine: Normal marrow signal. Sinuses/Orbits: Bilateral mastoid effusions. Right lens surgery. Other: None. IMPRESSION: 1. No acute intracranial abnormality. 2. Marked parenchymal volume loss. 3. Moderate chronic small vessel ischemia. 4. Bilateral mastoid effusions. Electronically Signed   By: Pedro Earls M.D.   On: 07/08/2019 15:36   DG Chest Port 1 View  Result Date: 07/08/2019 CLINICAL DATA:  Status post endotracheal intubation EXAM: PORTABLE CHEST 1 VIEW COMPARISON:  Film from earlier in the same day. FINDINGS: Feeding catheter is noted extending towards the stomach. Endotracheal tube is noted in satisfactory position. The lungs are well aerated bilaterally. Mild increased density is noted in the right base consistent with atelectasis similar to that noted on the prior exam. Previously seen effusions are not well appreciated. No bony abnormality is noted. IMPRESSION: Persistent right basilar atelectasis. Tubes and lines in satisfactory position. Electronically Signed   By: Inez Catalina M.D.   On: 07/08/2019 15:44   DG Chest Port 1  View  Result Date: 07/08/2019 CLINICAL DATA:  Acute respiratory failure. Stroke. EXAM: PORTABLE CHEST 1 VIEW COMPARISON:  07/06/2019 FINDINGS: Feeding tube extending into the stomach. Normal sized heart. Aortic arch calcifications. The lungs remain hyperexpanded with interval hazy density in both lower lung zones, greater on the right. No visible pleural fluid at the lateral costophrenic angles. Thoracic spine and mild bilateral shoulder degenerative changes. IMPRESSION: 1. Interval bibasilar atelectasis, pneumonia or posteriorly layering pleural fluid, greater on the right. 2. Stable changes of COPD. 3. Aortic atherosclerosis. Electronically Signed   By: Claudie Revering M.D.   On: 07/08/2019 08:52    Cardiac Studies   Echo 07/04/19  ECHO: 07/04/2019 1. Left ventricular ejection fraction, by visual estimation, is 65 to  70%. The left ventricle has hyperdynamic function. There is no left  ventricular hypertrophy.  2. Left ventricular diastolic parameters are indeterminate.  3. Global right ventricle has normal systolic function.The right  ventricular size is normal.  4. Left atrial size was normal.  5. Right atrial size was normal.  6. The mitral valve is normal in structure. No evidence of mitral valve  regurgitation.  7. The tricuspid valve is normal in structure. Tricuspid valve  regurgitation is not demonstrated.  8. The aortic valve was not well visualized. Aortic valve regurgitation  is not visualized. No evidence of aortic valve stenosis.  9. The pulmonic valve was not well visualized. Pulmonic valve  regurgitation is not visualized.   Patient Profile     85 y.o. male  with hx HTN, DM, admitted 01/31 with stroke-like sx (MRI neg CVA), encephalopathy possibly 2nd Sz, 02/04 had new onset atrial fibrillation/atrial flutter. Was sent to MRI 02/09 because MS was deteriorating>>cardiac arrest in MRI.   Assessment & Plan    1. Atrial fib/flutter: - was in SR till after code, then  fib>>flutter - now back in SR to ST at times may have a flutter difficult to see P wave vs flutter wave -no BB or Ca+ channel markers  - on heparin w/out signs/sx bleeding  2. Encephalopathy - cause unknown - MRI repeated today w/out acute abnormality - ?Sz, but none witnessed - concern for toxic encephalopathy, but no toxins found so far - Neuro seeing - had been extubated post arrest, then re-intubated for the 2nd time after CPR  3. Cardiac arrest - pt was off the monitor for the MRI - according to the notes, he became bradycardic>>PEA at 14:50, ROSC at 15:00 - pt lost consciousness, was in junctional brady when put back on the monitor - a great deal of vent ectopy seen, short runs NSVT -  Afib>>Aflutter >>SR  - as he had had problems maintaining his airway and MS had deteriorated today, suspect hypoxia as cause of cardiac arrest - Also concern for hypokalemia as a cause, possible VT or torsades. Now 07/09/19  K+ 4.5 Mg+ 1.9  - However, would not expect the presenting rhythm to be junctional brady when placed back on the monitor if he had been in VT/Torsades/VF -  Will continue to follow - BP soft at times.       4.  Respiratory failure on Vent and sedated, managed by CCM      For questions or updates, please contact [place] Please consult [email or web address] for contact info under        Signed, Cecilie Kicks, NP  07/09/2019, 9:18 AM

## 2019-07-09 NOTE — Procedures (Signed)
Central Venous Catheter Insertion Procedure Note Seth Boyd [id number] 1935/04/01  Procedure: Insertion of Central Venous Catheter Indications: Assessment of intravascular volume, Drug and/or fluid administration and Frequent blood sampling  Procedure Details Consent: Risks of procedure as well as the alternatives and risks of each were explained to the (patient/caregiver).  Consent for procedure obtained. Time Out: Verified patient identification, verified procedure, site/side was marked, verified correct patient position, special equipment/implants available, medications/allergies/relevent history reviewed, required imaging and test results available.  Performed  Maximum sterile technique was used including antiseptics, cap, gloves, gown, hand hygiene, mask and sheet. Skin prep: Chlorhexidine; local anesthetic administered A antimicrobial bonded/coated triple lumen catheter was placed in the right internal jugular vein using the Seldinger technique. Catheter placed to 16 cm. Blood aspirated via all 3 ports and then flushed x 3. Line sutured x 4 and dressing applied.  Ultrasound guidance used.Yes.    Evaluation Blood flow good Complications: No apparent complications Patient did tolerate procedure well. Chest X-ray ordered to verify placement.  CXR: pending.   Joneen Roach, AGACNP-BC [place] [facility]  See Amion for personal pager PCCM on call pager [phone]  07/09/2019 2:44 PM

## 2019-07-09 NOTE — Progress Notes (Signed)
PCCM INTERVAL PROGRESS NOTE  Called to bedside for worsening shock. BP rapidly dropped to 40s systolic despite 3 pressors. Responded to 0.5 mg epi at the direction of Dr. Vassie Loll. Upon my arrival BP is 130s systolic after epi push. ISTAT ABG shows ongoing metabolic acidosis. ISTAT hemoglobin is 5.4. No obvious signs of bleeding. Small amount of free fluid on limited bedside echo. LV hyperdynamic earlier today and he has been given fluid.   Plan: Son to bedside for updated and goals discussion 2 amps bicarb and infusion 2 units PRBC, 12.5 grams albumin in the meantime  Stat CBC and CMP   Now formal hemoglobin 5.1. Blood transfusing. Was on heparin until 1300, will give protamine.   Spoke with son. He understands CPR and heroic measures would almost certainly be unsuccessful. He would like to pursue DNR should Seth Boyd arrest.    Seth Boyd, AGACNP-BC [place] [facility]  See Amion for personal pager PCCM on call pager [phone]  07/09/2019 6:54 PM

## 2019-07-09 NOTE — Progress Notes (Signed)
SLP Cancellation Note  Patient Details Name: Seth Boyd MRN: [id number] DOB: 05/22/1935   Cancelled treatment:       Reason Eval/Treat Not Completed: Medical issues which prohibited therapy (pt with PEA arrest on previous date, now on vent). Will f/u as able.    Mahala Menghini., M.A. CCC-SLP Acute Rehabilitation Services Pager [phone] Office [phone]  07/09/2019, 7:28 AM

## 2019-07-09 NOTE — Progress Notes (Signed)
ANTICOAGULATION CONSULT NOTE   Pharmacy Consult for heparin Indication: atrial fibrillation   Assessment: 89 yoM admitted as code stroke with negative imaging. Pt developed AFib 2/4 during respiratory arrest requiring intubation. CHADSVASc = 4, no AC PTA. Will target normal heparin level goal given negative stroke workup.  Heparin level remains within goal. Cbc stable. No active bleed issues reported.  Goal of Therapy:  Heparin level 0.3-0.7 units/ml Monitor platelets by anticoagulation protocol: Yes   Plan:  Continue heparin at 800 units/hr Monitor daily heparin level and CBC, s/sx bleeding  Jeanella Cara, PharmD, [facility] Clinical Pharmacist Please see AMION for all Pharmacists' Contact Phone Numbers 07/09/2019, 8:59 AM

## 2019-07-09 NOTE — Progress Notes (Signed)
Late Entry:  LTM maint complete - no skin breakdown under: F7,Fp1 Fp2 F8

## 2019-07-09 NOTE — Progress Notes (Addendum)
1100:  Patient's blood pressure started trending down at 1100.  Blood pressure responded.  Patient less responsive compare to earlier assessment.  CCM notified and verbal order for saline bolus.    12:00:  Patient's blood pressure started trending down again.  CCM notified and pressors started.  Pressors titrated to max dose and blood pressure still below goal.  CCM paged and updated while neurology MD in room assessing patient.  CCM came up to assess patient.  Patient's son updated.  New orders received.  RN to continue to monitor.   1745: Patient blood pressure 36/26, blood pressure at 1730 110/62.  Patient noticeable paler, RN assess for pulse; pulse present and pressors titrated (See MAR).  CCM paged and updated on patient's condition.  Verbal order for .5  Epi..  Blood pressure rebounded.  CCM NP came to bedside and assess patient.  New orders received.  Family updated.

## 2019-07-09 NOTE — Procedures (Addendum)
Patient Name:Braxxton Gesell HLK:[id number] Epilepsy Attending:Eshani Maestre Annabelle Harman Referring Physician/Provider:Dr. Georgiana Spinner Aroor Duration: 07/08/2019 1703 to 07/09/2019 1703  Patient history:84 y.o.malewith PMH DM who presented to ED with c/o aphasia and right side weakness. CTH was negative for hemorrhage.  Has been having waxing and waning mental status. EEG to evaluate for seizure  Level of alertness:comatose  AEDs during EEG study:LEV  Technical aspects: This EEG study was done with scalp electrodes positioned according to the 10-20 International system of electrode placement. Electrical activity was acquired at a sampling rate of 500Hz  and reviewed with a high frequency filter of 70Hz  and a low frequency filter of 1Hz . EEG data were recorded continuously and digitally stored.  DESCRIPTION:  EEG showed continuous generalized rhythmic 2 to 3 Hz delta slowing as well as intermittent generalized 5 to 6 Hz delta slowing.  Hyperventilation and photic stimulation were not performeddue to ams.  Ventilator artifact was also noted at times.  ABNORMALITY -Continuous rhythmic slow, generalized  IMPRESSION: This study  issuggestive of moderate diffuse encephalopathy,non specific to etiology.No seizures or epileptiform discharges were seen throughout the recording.    Indie Boehne 

## 2019-07-09 NOTE — Progress Notes (Signed)
eLink Physician-Brief Progress Note Patient Name: Stephane Niemann DOB: 14-May-1935 MRN: [id number]   Date of Service  07/09/2019  HPI/Events of Note  INR 2.7, Pt with ongoing bleeding history.  eICU Interventions  Transfuse 4 units of FFP        Lucetta Baehr U Kohei Antonellis 07/09/2019, 8:29 PM

## 2019-07-09 NOTE — Progress Notes (Signed)
BP 40s systolic, pt looks very pale. Pulse is present. MD notified. One half amp epi given per order.

## 2019-07-09 NOTE — Progress Notes (Addendum)
NAME:  Seth Boyd, MRN:  [id number], DOB:  [birth year], LOS: 10 ADMISSION DATE:  06/08/2019, CONSULTATION DATE:  06/14/2019 REFERRING MD:  Ophelia Charter, CHIEF COMPLAINT:  Encephalopathy   Brief History   85 y.o. male, goes by Seth Boyd, with prior hx HTN/ DM presenting after being found on floor with acute encephalopathy.  Presented as code stroke.  Noted to have initial left gaze.  LSW 1/30 ~4 pm.  Rule out for stroke with negative CTH/ CTA.  Concern for seizure +/- toxic/ metabolic encephalopathy.    Past Medical History  HTN, DM  Significant Hospital Events   1/31 Admit 2/02 extubated 2/03 difficulty with agitation and airway protection, weak cough 2/04 reintubated early am, a flutter New onset a fib/flutter - on heparin 2/5 issues with foley leaking  Started back on propofol  2/6 stable Apneic with PSV  2/8 Extubated; PEA arrest in MRI 3 mins duration. Re-intuabted   Consults:  Neurology Cardiology  Procedures:  1/31 ETT >> 2/02 2/04 ETT >> 2/8 2/8 ETT >>  Significant Diagnostic Tests:  1/31 CT head/neck >> 30% ICA stenosis on the right, 20% on the left, 30-50% narrowing in both carotid siphon regions but no correctable proximal stenosis. 1/31 EEG >> moderate diffuse encephalopathy, no epileptiform activity 2/01 brain MRI >> Moderate cerebral atrophy with chronic microvascular ischemic disease, Soft tissue edema within the right occipital/suboccipital scalp. 2/01 LTM  >> moderate diffuse encephalopathy 2/05 Echo >> LVEF 65-70%. Otherwise, unimpressive cxr 07/04/19 Bibasilar infiltrates consistent with pneumonia or aspiration, RIGHT greater than LEFT, slightly increased on RIGHT since prior study MRI 2/8 > No acute intracranial abnormality. Marked parenchymal volume loss. Moderate chronic small vessel ischemia. EEG 2/8 >>>  Micro Data:  1/31 SARS 2/ flu A/B >> neg 1/31 MRSA PCR >> negative 2/8 resp >> Few enterobacter  Antimicrobials:  Unasyn 2/02 >> 2/8 Zosyn 2/9  >>>   Interim history/subjective:  Extubated yesterday, then suffered cardiac arrest while in MRI, 3 mins in duration.    Objective   Blood pressure 107/64, pulse (!) 109, temperature 98.3 F (36.8 C), temperature source Axillary, resp. rate (!) 22, height 5\' 10"  (1.778 m), weight 68.3 kg, SpO2 100 %.    Vent Mode: PSV;CPAP FiO2 (%):  [30 %-100 %] 30 % Set Rate:  [12 bmp] 12 bmp Vt Set:  [510 mL-580 mL] 510 mL PEEP:  [5 cmH20] 5 cmH20 Pressure Support:  [14 cmH20] 14 cmH20 Plateau Pressure:  [10 cmH20-15 cmH20] 15 cmH20   Intake/Output Summary (Last 24 hours) at 07/09/2019 0836 Last data filed at 07/09/2019 0700 Gross per 24 hour  Intake 1941.47 ml  Output 875 ml  Net 1066.47 ml   Filed Weights   07/07/19 0354 07/08/19 0254 07/09/19 0351  Weight: 70.3 kg 71.9 kg 68.3 kg   Examination:  General:  Elderly male on vent. Normal body habitus HEENT: [place]/AT, PERRL, no JVD Neuro: very HOH, sleepy, arouses to verbal and follows simple commands.  CV: RRR, no MRG PULM: [place], vent assisted breaths. Unlabored on SBT GI: soft, bs+, condom cath  Extremities: warm/dry, no LE edema, atrophy  Skin: no rashes    Resolved Hospital Problem list   Rhabdomyolysis  Assessment & Plan:   Acute hypoxic respiratory failure with compromised airway: he continues to fail extubation most recently suffering cardiac arrest while in MRI 2/9. Reasoning not well understood. Seems to be having central apneas that are not well explained.  - Full vent support - Wean as tolerated - Will need to  do some more investigation prior to re-attempting extubation  - VAP bundle - neuro now following again   Aspiration pneumonia - completed 7 days of Unasyn 2/8. Culture now growing enterobacter - Start Zosyn - Await susceptibilities  - follow trach aspirate from 2/8  Acute metabolic encephalopathy: thus far, thought possibly related to seizure but since hospitalization, no clinical seizures observed, EEG  negative thus far, imaging negative, some arrhythmias - aflutter/ Afib (initial event could have been a syncope), really unclear this at this point.  - continue keppra  - Neuro following.  - PRN sedation for RASS goal 0 to -1.   New onset Atrial flutter 2/4-spontaneous converted to sinus rhythm Episodes of bradycardia.  CHADSVASc =4 HTN, HLD. - cardiology following - remains in sinus, continue to monitor for arrhythmias that may contribute his encephalopathy - continue ASA, lipitor when able - prn IV hydralazine for SBP > 170 - continue heparin gtt   Dysphagia with difficulty placing OG/NG tube. Severe protein calorie malnutrition. - TF - SLP when encephalopathy improved  DM type 2 poorly controlled. - continue SSI - Add levemir and TF coverage  B12 deficiency.  - B12 supplementation initiated 2/1  Hx of GERD. - continue protonix  Hypokalemia with hypomagnesemia - monitor with TF given malnutrition, phos remains ok  - goal K >4, Mag > 2 - recheck in am    Concern for airway edema  - s/p decadron course, no stridor   Best practice:  Diet: tube feeds DVT prophylaxis: heparin gtt  GI prophylaxis: PPI  Glucose control: monitor Mobility: bed Code Status: full Disposition: ICU   CCT 40 mins    Georgann Housekeeper, AGACNP-BC [place] for personal pager PCCM on call pager [phone]  07/09/2019 8:58 AM

## 2019-07-09 NOTE — Progress Notes (Signed)
2 units of PRBC was ordered emergently by Joneen Roach, CCM, NP. First unit started at 1850.

## 2019-07-09 NOTE — Progress Notes (Addendum)
Pharmacy Antibiotic Note  Seth Boyd is a 85 y.o. male admitted on 06/16/2019 with pneumonia.  Pharmacy has been consulted for Zosyn and Vancomycin dosing.  Trach aspirate from 2/8 growing enterobacter - susceptibilities pending  Vancomycin 750 mg IV Q 24 hrs. Goal AUC 400-550. Expected AUC: 497 SCr used: 1.74  Plan: Zosyn 3.375g IV q8h (4 hour infusion).  Vancomycin 750 mg IV q24hr Monitor renal function, susceptibilities and vanc levels as needed  Height: 5\' 10"  (177.8 cm) Weight: 150 lb 9.2 oz (68.3 kg) IBW/kg (Calculated) : 73  Temp (24hrs), Avg:98.1 F (36.7 C), Min:97.6 F (36.4 C), Max:98.5 F (36.9 C)  Recent Labs  Lab 07/05/19 0341 07/06/19 0159 07/07/19 0528 07/08/19 0404 07/08/19 1520 07/08/19 2219 07/09/19 0630  WBC 8.5 5.7 6.7 6.0 11.0*  --   --   CREATININE 0.61  --   --  0.58* 0.62 0.58* 1.16    Estimated Creatinine Clearance: 47.4 mL/min (by C-G formula based on SCr of 1.16 mg/dL).    No Known Allergies  Antimicrobials this admission: Zosyn 2/10 >> Vanc 2/10>>  Microbiology results:  2/8 Sputum: few enterobacter    Thank you for allowing pharmacy to be a part of this patient's care.  4/8, PharmD, [facility] Clinical Pharmacist Please see AMION for all Pharmacists' Contact Phone Numbers 07/09/2019, 8:58 AM

## 2019-07-09 NOTE — Progress Notes (Signed)
OT Cancellation Note  Patient Details Name: Seth Boyd MRN: [id number] DOB: [birth year]   Cancelled Treatment:    Reason Eval/Treat Not Completed: Patient not medically ready. Pt in cardiac arrest recently and recovering.  OT to continue to follow.  Flora Lipps, OTR/L Acute Rehabilitation Services Pager: [phone] Office: [phone]   Lonzo Cloud 07/09/2019, 5:26 PM

## 2019-07-09 NOTE — Progress Notes (Signed)
Rehab Admissions Coordinator Note:  Per PT recommendation on 07/08/19, this patient was screened by Cheri Rous for appropriateness for an Inpatient Acute Rehab Consult.  Noted pt is now on vent and not medically ready for a consult at this time. AC will follow and will request consult once appropriate.    Cheri Rous 07/09/2019, 3:04 PM  I can be reached at [phone].

## 2019-07-10 ENCOUNTER — Inpatient Hospital Stay (HOSPITAL_COMMUNITY): Payer: Medicare HMO

## 2019-07-10 DIAGNOSIS — N179 Acute kidney failure, unspecified: Secondary | ICD-10-CM

## 2019-07-10 DIAGNOSIS — R578 Other shock: Secondary | ICD-10-CM

## 2019-07-10 LAB — COMPREHENSIVE METABOLIC PANEL
ALT: 2183 U/L — ABNORMAL HIGH (ref 0–44)
AST: 3105 U/L — ABNORMAL HIGH (ref 15–41)
Albumin: 2.6 g/dL — ABNORMAL LOW (ref 3.5–5.0)
Alkaline Phosphatase: 101 U/L (ref 38–126)
Anion gap: 18 — ABNORMAL HIGH (ref 5–15)
BUN: 63 mg/dL — ABNORMAL HIGH (ref 8–23)
CO2: 27 mmol/L (ref 22–32)
Calcium: 8 mg/dL — ABNORMAL LOW (ref 8.9–10.3)
Chloride: 101 mmol/L (ref 98–111)
Creatinine, Ser: 2.67 mg/dL — ABNORMAL HIGH (ref 0.61–1.24)
GFR calc Af Amer: 25 mL/min — ABNORMAL LOW (ref >60)
GFR calc non Af Amer: 21 mL/min — ABNORMAL LOW (ref >60)
Glucose, Bld: 164 mg/dL — ABNORMAL HIGH (ref 70–99)
Potassium: 4.5 mmol/L (ref 3.5–5.1)
Sodium: 146 mmol/L — ABNORMAL HIGH (ref 135–145)
Total Bilirubin: 1.3 mg/dL — ABNORMAL HIGH (ref 0.3–1.2)
Total Protein: 5.4 g/dL — ABNORMAL LOW (ref 6.5–8.1)

## 2019-07-10 LAB — CBC
HCT: 25.3 % — ABNORMAL LOW (ref 39.0–52.0)
HCT: 20.5 % — ABNORMAL LOW (ref 39.0–52.0)
HCT: 24.3 % — ABNORMAL LOW (ref 39.0–52.0)
HCT: 21.5 % — ABNORMAL LOW (ref 39.0–52.0)
HCT: 23.6 % — ABNORMAL LOW (ref 39.0–52.0)
Hemoglobin: 8.4 g/dL — ABNORMAL LOW (ref 13.0–17.0)
Hemoglobin: 6.9 g/dL — CL (ref 13.0–17.0)
Hemoglobin: 8.1 g/dL — ABNORMAL LOW (ref 13.0–17.0)
Hemoglobin: 7.2 g/dL — ABNORMAL LOW (ref 13.0–17.0)
Hemoglobin: 8.3 g/dL — ABNORMAL LOW (ref 13.0–17.0)
MCH: 30.7 pg (ref 26.0–34.0)
MCH: 30.8 pg (ref 26.0–34.0)
MCH: 29.9 pg (ref 26.0–34.0)
MCH: 30 pg (ref 26.0–34.0)
MCH: 30.6 pg (ref 26.0–34.0)
MCHC: 33.2 g/dL (ref 30.0–36.0)
MCHC: 33.7 g/dL (ref 30.0–36.0)
MCHC: 33.3 g/dL (ref 30.0–36.0)
MCHC: 33.5 g/dL (ref 30.0–36.0)
MCHC: 35.2 g/dL (ref 30.0–36.0)
MCV: 92.3 fL (ref 80.0–100.0)
MCV: 91.5 fL (ref 80.0–100.0)
MCV: 89.7 fL (ref 80.0–100.0)
MCV: 89.6 fL (ref 80.0–100.0)
MCV: 87.1 fL (ref 80.0–100.0)
Platelets: 227 10*3/uL (ref 150–400)
Platelets: 173 10*3/uL (ref 150–400)
Platelets: 174 10*3/uL (ref 150–400)
Platelets: 127 10*3/uL — ABNORMAL LOW (ref 150–400)
Platelets: 120 10*3/uL — ABNORMAL LOW (ref 150–400)
RBC: 2.74 MIL/uL — ABNORMAL LOW (ref 4.22–5.81)
RBC: 2.24 MIL/uL — ABNORMAL LOW (ref 4.22–5.81)
RBC: 2.71 MIL/uL — ABNORMAL LOW (ref 4.22–5.81)
RBC: 2.4 MIL/uL — ABNORMAL LOW (ref 4.22–5.81)
RBC: 2.71 MIL/uL — ABNORMAL LOW (ref 4.22–5.81)
RDW: 13.7 % (ref 11.5–15.5)
RDW: 14.1 % (ref 11.5–15.5)
RDW: 14.3 % (ref 11.5–15.5)
RDW: 14.9 % (ref 11.5–15.5)
RDW: 14.6 % (ref 11.5–15.5)
WBC: 27.1 10*3/uL — ABNORMAL HIGH (ref 4.0–10.5)
WBC: 24.6 10*3/uL — ABNORMAL HIGH (ref 4.0–10.5)
WBC: 23.1 10*3/uL — ABNORMAL HIGH (ref 4.0–10.5)
WBC: 18.5 10*3/uL — ABNORMAL HIGH (ref 4.0–10.5)
WBC: 18.1 10*3/uL — ABNORMAL HIGH (ref 4.0–10.5)
nRBC: 0.7 % — ABNORMAL HIGH (ref 0.0–0.2)
nRBC: 0.5 % — ABNORMAL HIGH (ref 0.0–0.2)
nRBC: 0.5 % — ABNORMAL HIGH (ref 0.0–0.2)
nRBC: 0.6 % — ABNORMAL HIGH (ref 0.0–0.2)
nRBC: 0.7 % — ABNORMAL HIGH (ref 0.0–0.2)

## 2019-07-10 LAB — GLUCOSE, CAPILLARY
Glucose-Capillary: 107 mg/dL — ABNORMAL HIGH (ref 70–99)
Glucose-Capillary: 129 mg/dL — ABNORMAL HIGH (ref 70–99)
Glucose-Capillary: 151 mg/dL — ABNORMAL HIGH (ref 70–99)
Glucose-Capillary: 157 mg/dL — ABNORMAL HIGH (ref 70–99)
Glucose-Capillary: 163 mg/dL — ABNORMAL HIGH (ref 70–99)
Glucose-Capillary: 164 mg/dL — ABNORMAL HIGH (ref 70–99)
Glucose-Capillary: 350 mg/dL — ABNORMAL HIGH (ref 70–99)

## 2019-07-10 LAB — LACTIC ACID, PLASMA: Lactic Acid, Venous: 5.9 mmol/L (ref 0.5–1.9)

## 2019-07-10 LAB — PROTIME-INR
INR: 2.1 — ABNORMAL HIGH (ref 0.8–1.2)
INR: 1.9 — ABNORMAL HIGH (ref 0.8–1.2)
Prothrombin Time: 23.9 seconds — ABNORMAL HIGH (ref 11.4–15.2)
Prothrombin Time: 21.7 seconds — ABNORMAL HIGH (ref 11.4–15.2)

## 2019-07-10 LAB — CULTURE, RESPIRATORY W GRAM STAIN

## 2019-07-10 LAB — MAGNESIUM: Magnesium: 2.2 mg/dL (ref 1.7–2.4)

## 2019-07-10 LAB — BASIC METABOLIC PANEL
Anion gap: 19 — ABNORMAL HIGH (ref 5–15)
BUN: 45 mg/dL — ABNORMAL HIGH (ref 8–23)
CO2: 24 mmol/L (ref 22–32)
Calcium: 7.7 mg/dL — ABNORMAL LOW (ref 8.9–10.3)
Chloride: 102 mmol/L (ref 98–111)
Creatinine, Ser: 2.22 mg/dL — ABNORMAL HIGH (ref 0.61–1.24)
GFR calc Af Amer: 31 mL/min — ABNORMAL LOW (ref >60)
GFR calc non Af Amer: 27 mL/min — ABNORMAL LOW (ref >60)
Glucose, Bld: 172 mg/dL — ABNORMAL HIGH (ref 70–99)
Potassium: 5 mmol/L (ref 3.5–5.1)
Sodium: 145 mmol/L (ref 135–145)

## 2019-07-10 LAB — PREPARE RBC (CROSSMATCH)

## 2019-07-10 LAB — PHOSPHORUS: Phosphorus: 4.7 mg/dL — ABNORMAL HIGH (ref 2.5–4.6)

## 2019-07-10 IMAGING — CT CT HEAD W/O CM
3 series · 15 of 47 positions shown, 18 images · non-contrast
Comparison: Brain MRI [DATE]

CLINICAL DATA: Stroke follow-up

EXAM:
CT HEAD WITHOUT CONTRAST
TECHNIQUE: Contiguous axial images were obtained from the base of the skull
through the vertex without intravenous contrast.

[Series 3: head 5.0 h30s · axial · 0.45mm/px · z∈[-110,+30]mm · 9 of 34 slices shown, 12 images]
[im 3/34  brain]
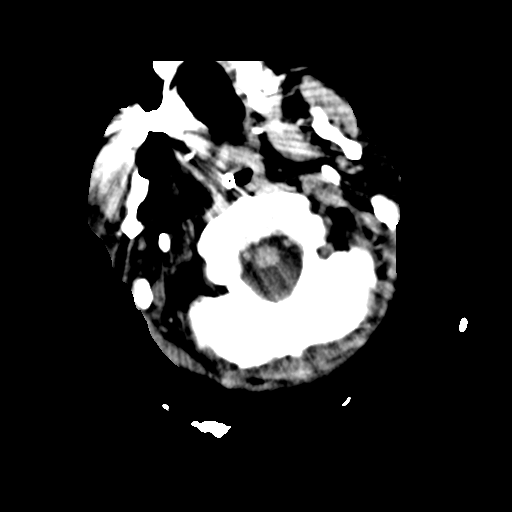
[im 3/34  bone]
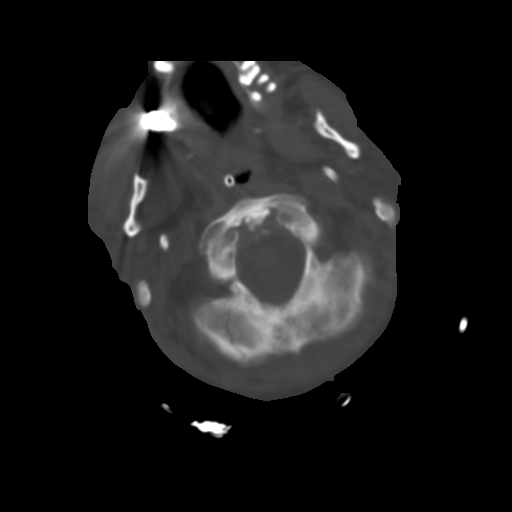
[im 6/34  brain]
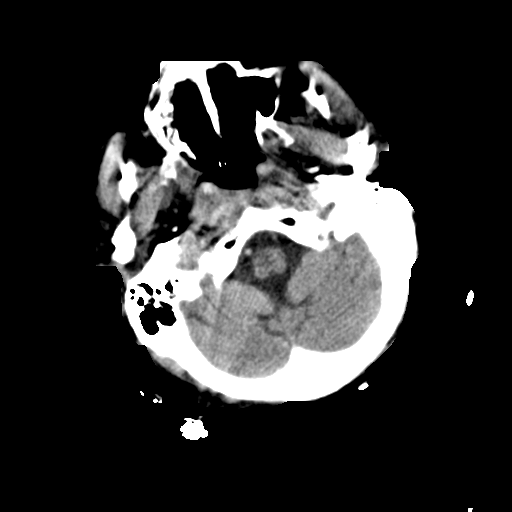
[im 10/34  brain]
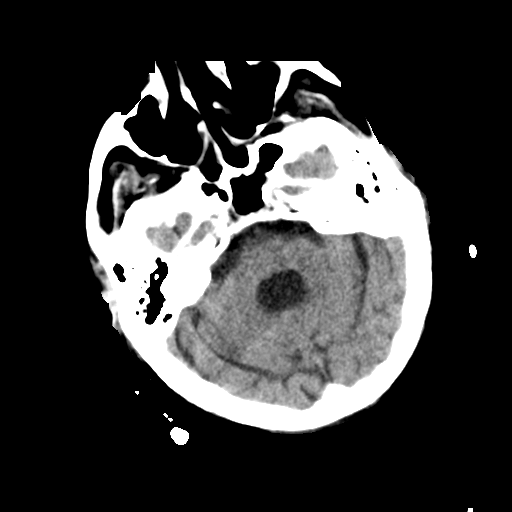
[im 13/34  brain]
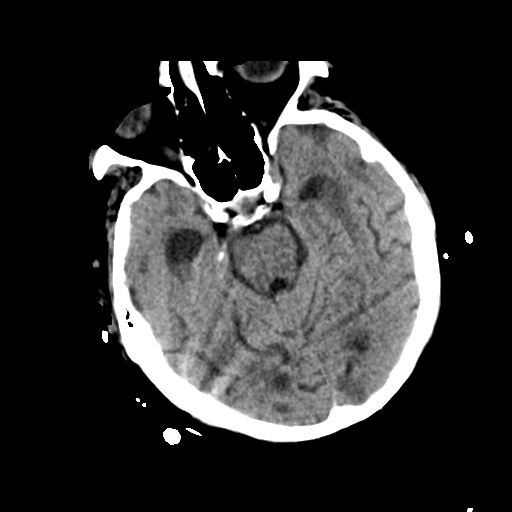
[im 18/34  brain]
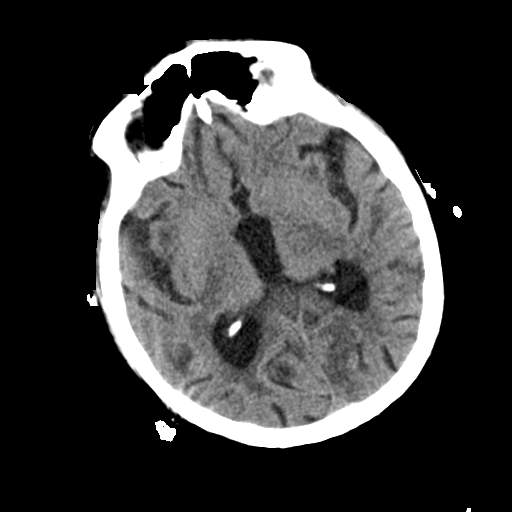
[im 18/34  bone]
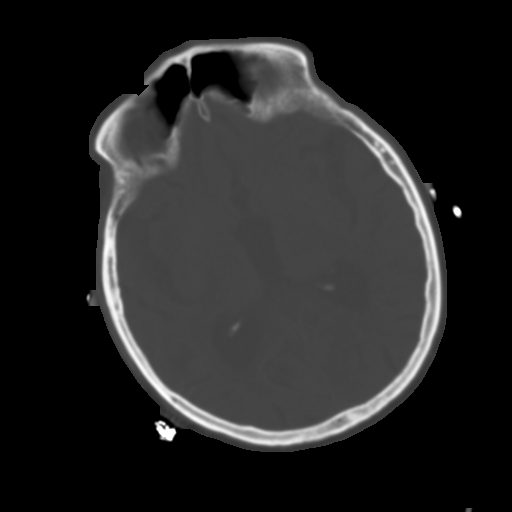
[im 21/34  brain]
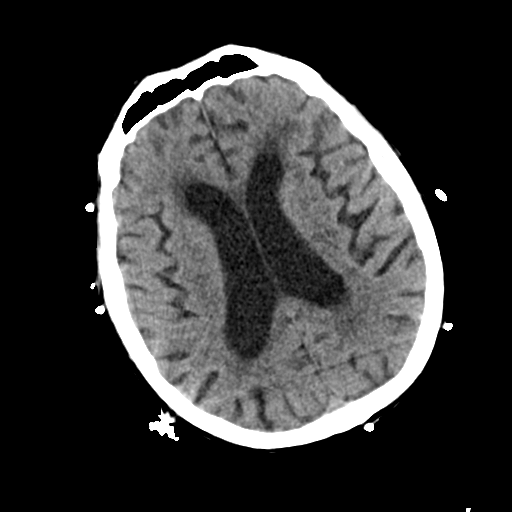
[im 24/34  brain]
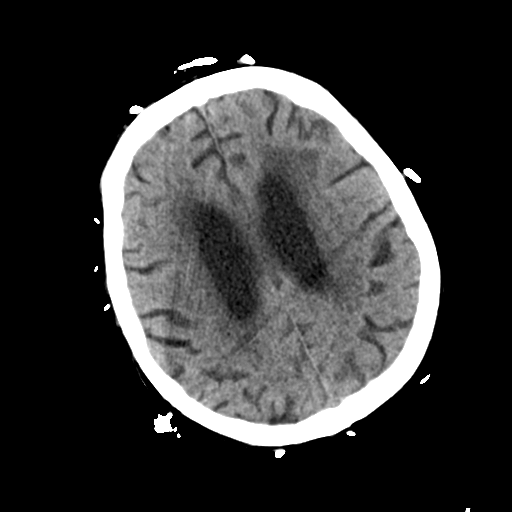
[im 28/34  brain]
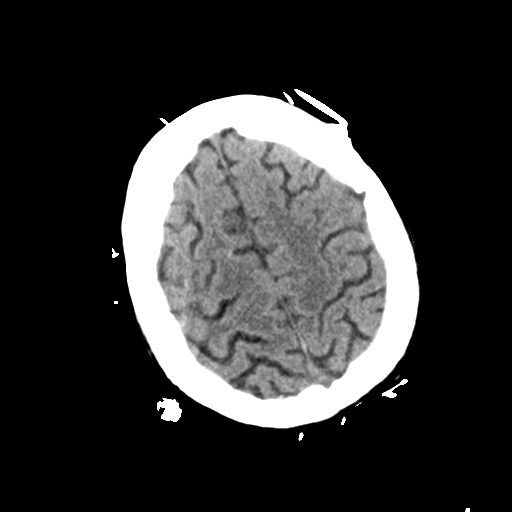
[im 31/34  brain]
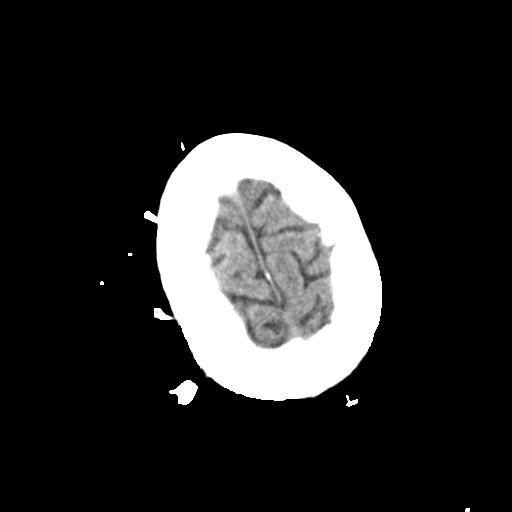
[im 31/34  bone]
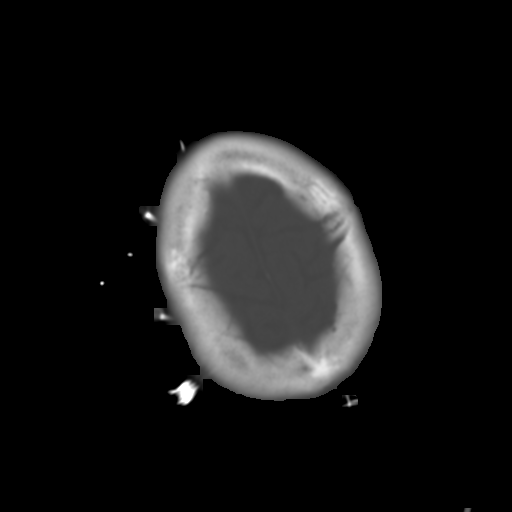

[Series 5: head 3.0 mpr cor · coronal · 0.36mm/px · 3 of 65 slices shown]
[im 22/65  brain]
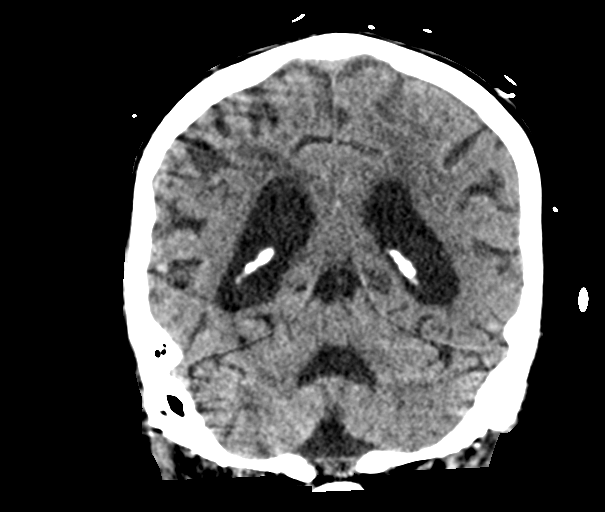
[im 29/65  brain]
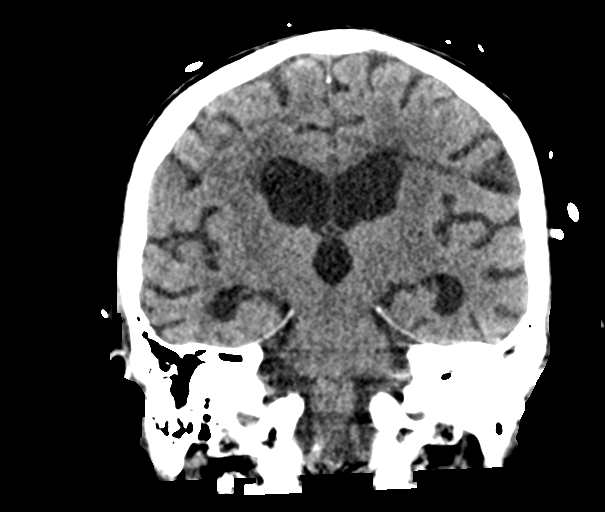
[im 36/65  brain]
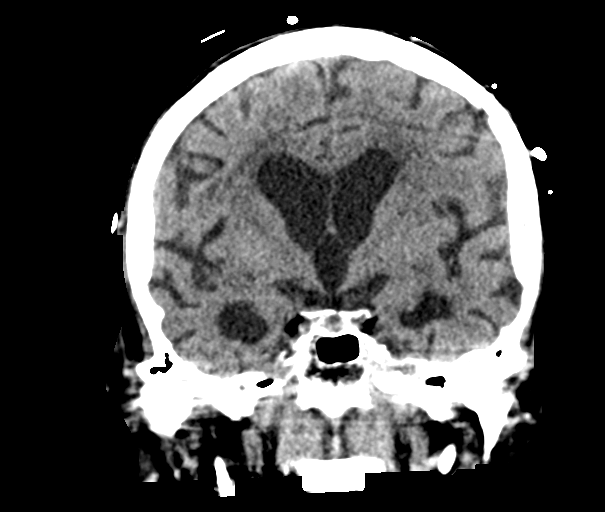

[Series 6: head 3.0 mpr sag · sagittal · 0.36mm/px · 3 of 57 slices shown]
[im 19/57  brain]
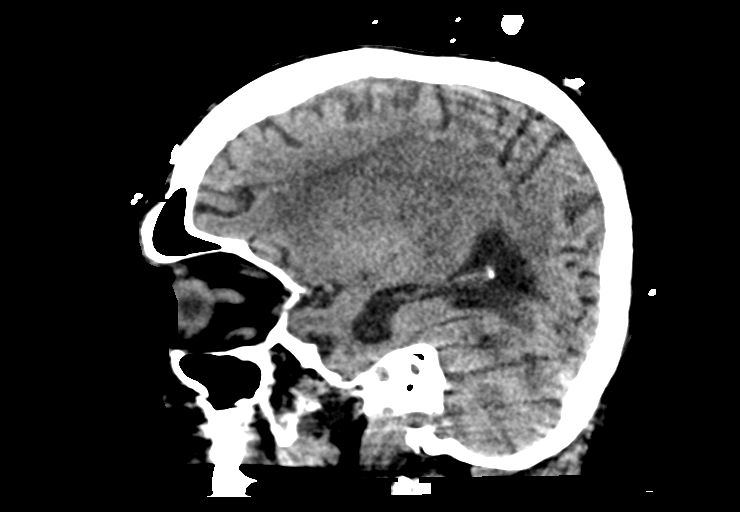
[im 29/57  brain]
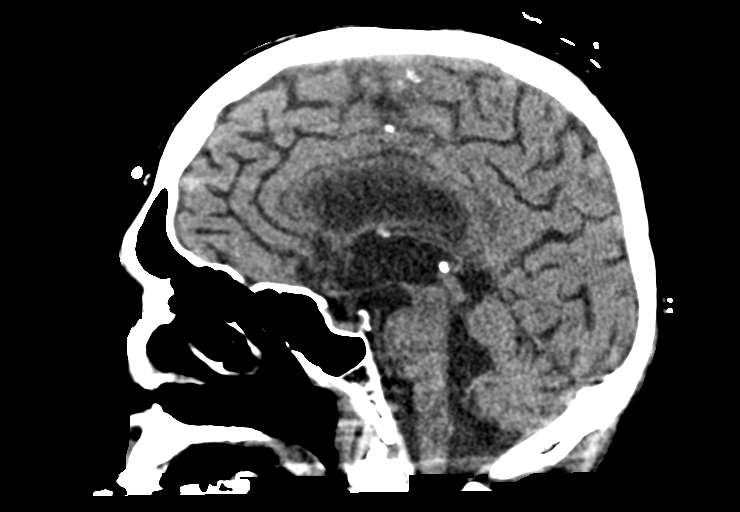
[im 38/57  brain]
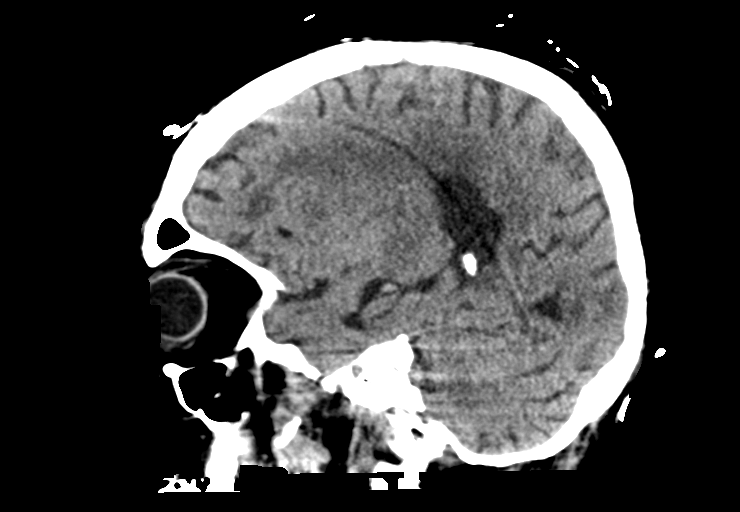

[15 of 47 positions shown; findings below may reference images not displayed]

FINDINGS: Brain: There are EEG leads in place the cause significant streak
artifact. There is hypoattenuation in the left occipital lobe that
is new compared to the CT of [DATE]. No acute hemorrhage. There
is generalized atrophy and periventricular hypoattenuation
consistent with chronic small vessel disease.

Vascular: Atherosclerotic calcification of the internal carotid
arteries at the skull base.

Skull: Normal. Negative for fracture or focal lesion.

Sinuses/Orbits: Left mastoid opacification

Other: None.
IMPRESSION: 1. Acute to early subacute infarct of the left occipital lobe, new
since [DATE].
[DATE]. No acute hemorrhage.

## 2019-07-10 IMAGING — CT CT ABD-PELV W/O CM
2 of 5 series · 16 of 46 positions shown, 18 images · non-contrast
Comparison: [DATE]

CLINICAL DATA: Anemia, shock

EXAM:
CT ABDOMEN AND PELVIS WITHOUT CONTRAST
TECHNIQUE: Multidetector CT imaging of the abdomen and pelvis was performed
following the standard protocol without IV contrast.

[Series 4: thins · axial · 0.88mm/px · z∈[-854,-411]mm · 13 of 686 slices shown, 15 images]
[im 27/686  soft-tissue]
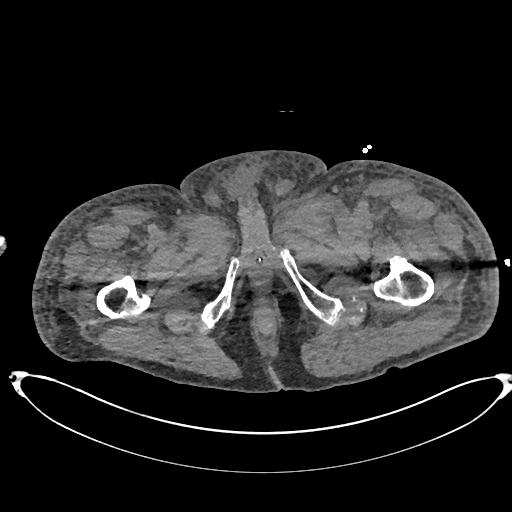
[im 27/686  bone]
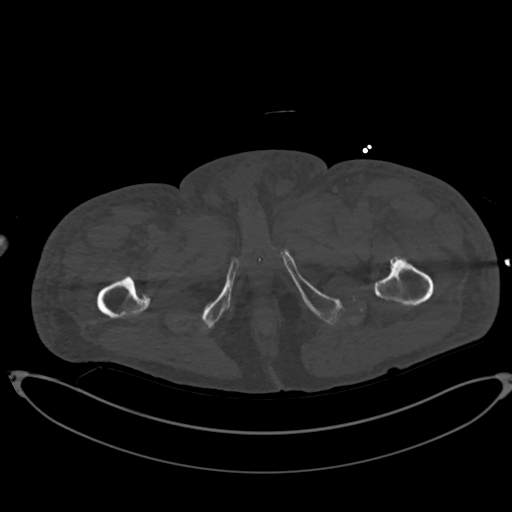
[im 80/686  soft-tissue]
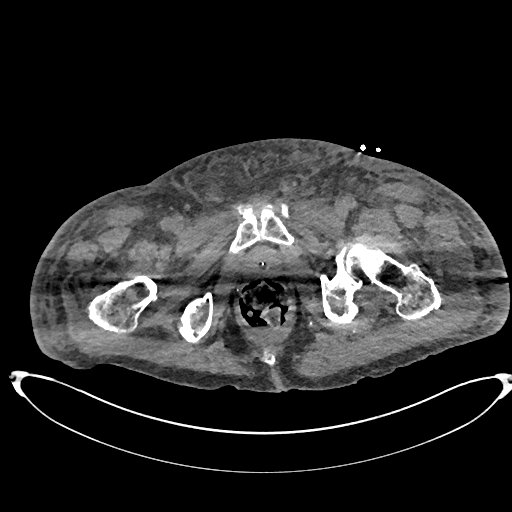
[im 132/686  soft-tissue]
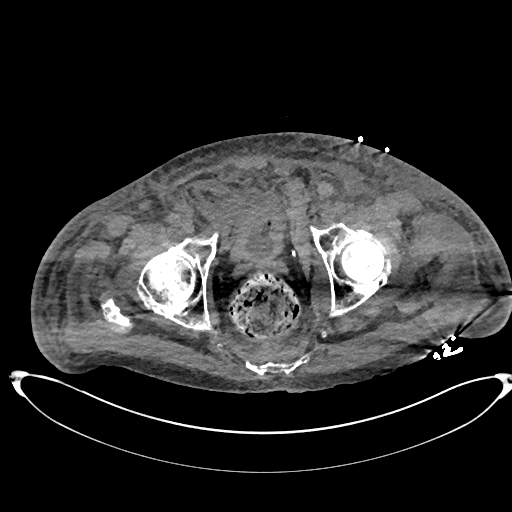
[im 185/686  soft-tissue]
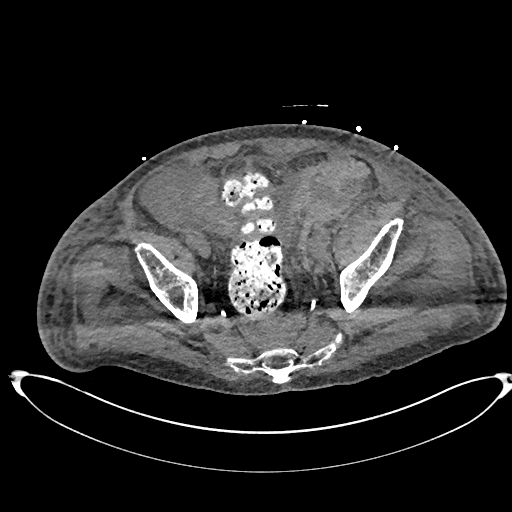
[im 238/686  soft-tissue]
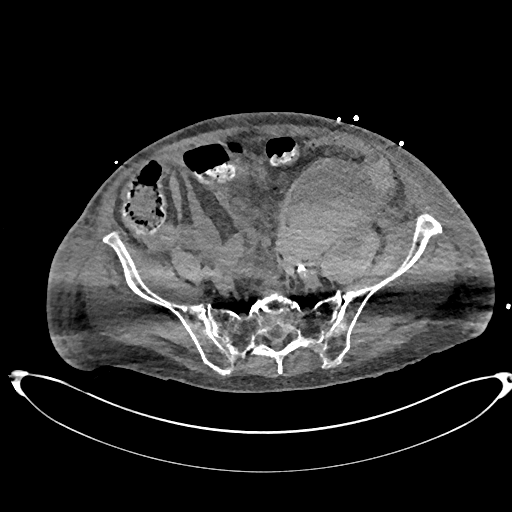
[im 290/686  soft-tissue]
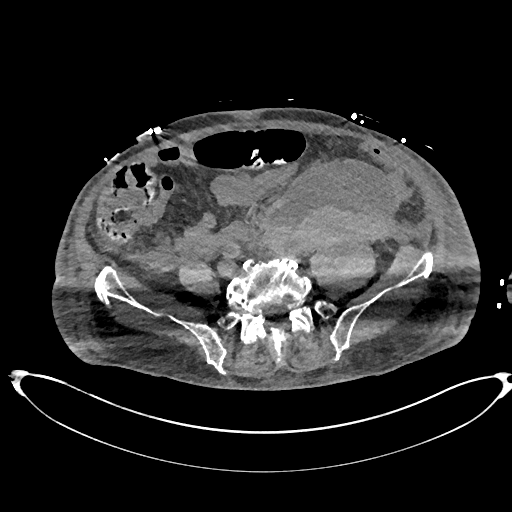
[im 343/686  soft-tissue]
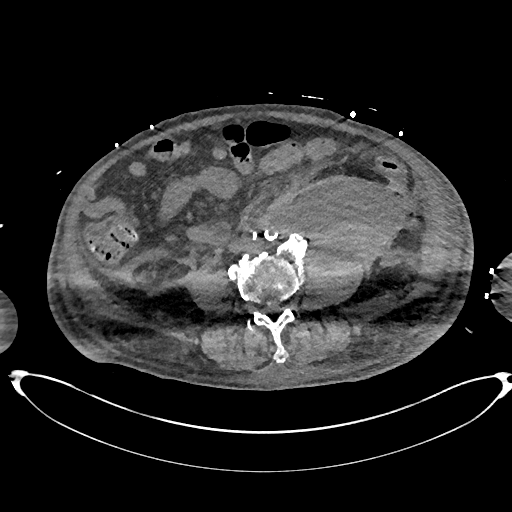
[im 396/686  soft-tissue]
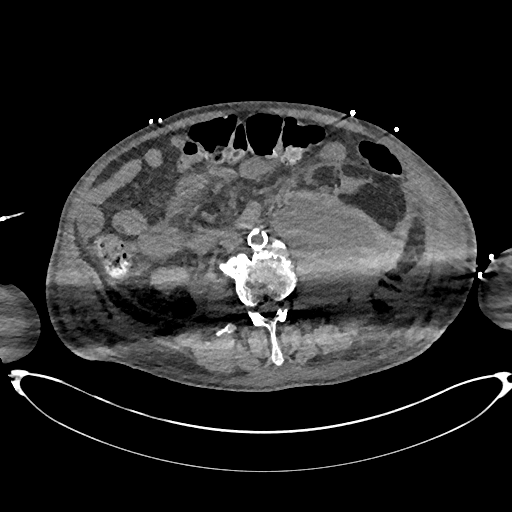
[im 448/686  soft-tissue]
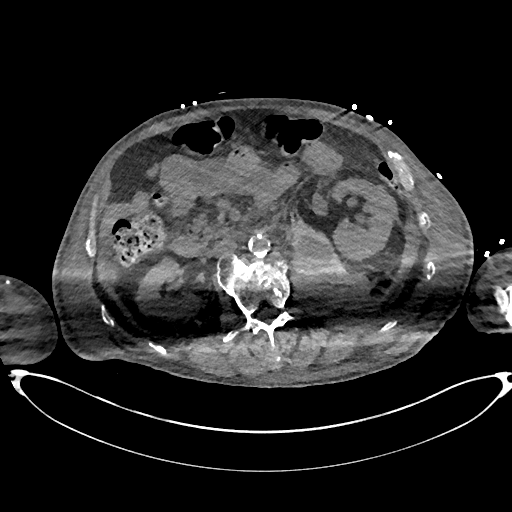
[im 448/686  bone]
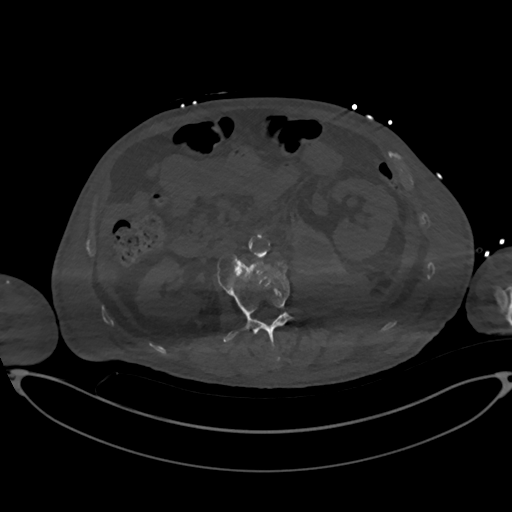
[im 501/686  soft-tissue]
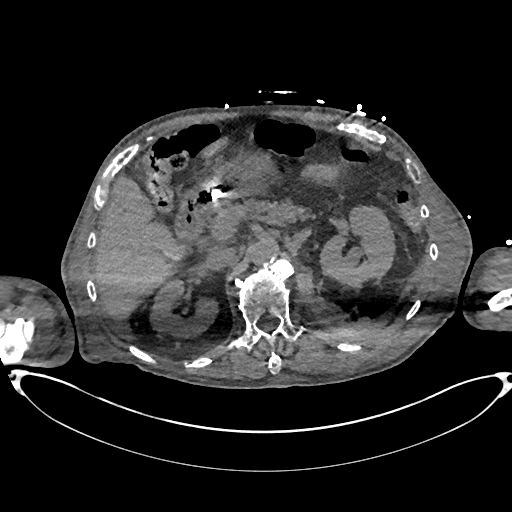
[im 554/686  soft-tissue]
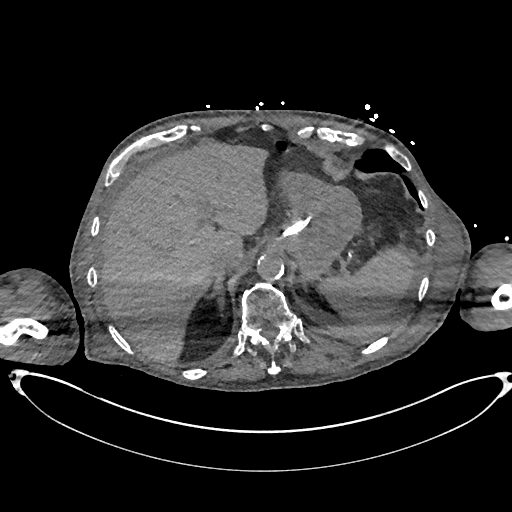
[im 606/686  soft-tissue]
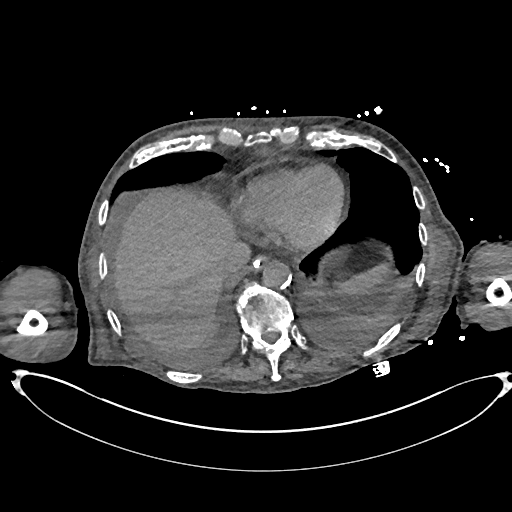
[im 659/686  soft-tissue]
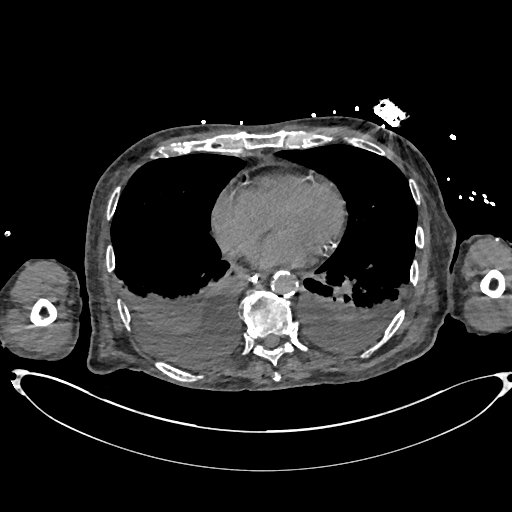

[Series 6: cor st · coronal · 0.90mm/px · 3 of 85 slices shown]
[im 29/85  soft-tissue]
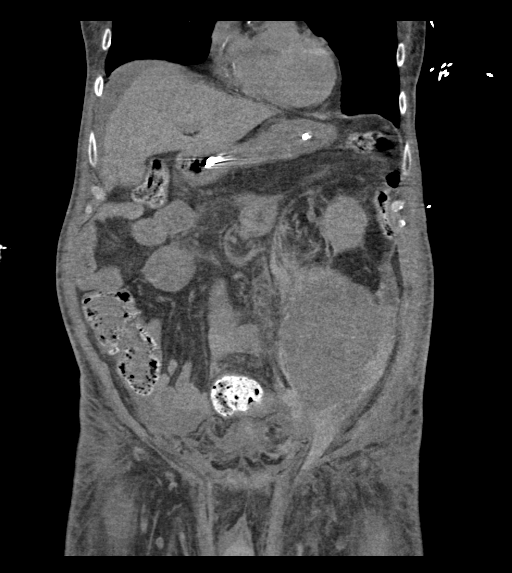
[im 38/85  soft-tissue]
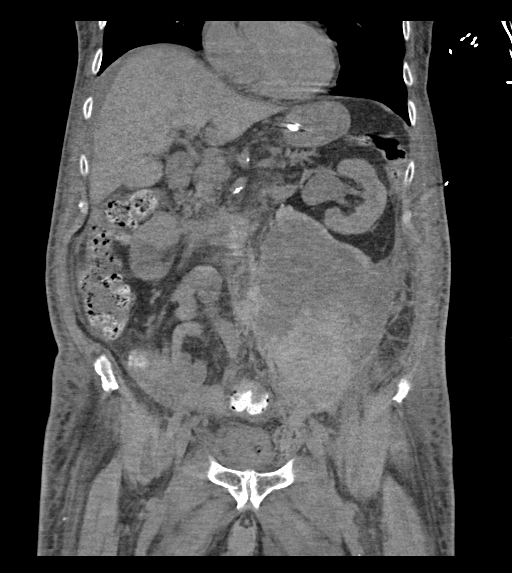
[im 47/85  soft-tissue]
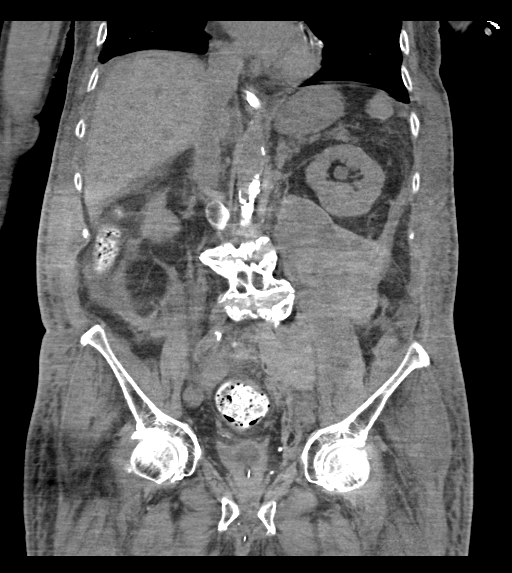

[16 of 46 positions shown; findings below may reference images not displayed]

FINDINGS: Lower chest: There are small bilateral pleural effusions estimated
less than 1 L each. Compressive atelectasis within the bilateral
lower lobes.

Hepatobiliary: Calcified gallstones are identified without
cholecystitis. Unenhanced imaging of the liver is unremarkable.

Pancreas: Unremarkable. No pancreatic ductal dilatation or
surrounding inflammatory changes.

Spleen: Normal in size without focal abnormality.

Adrenals/Urinary Tract: No urinary tract calculi or obstructive
uropathy. Bladder is decompressed with a Foley catheter. The
adrenals are unremarkable.

Stomach/Bowel: Enteric catheter within the gastric lumen. No bowel
obstruction or ileus.

Vascular/Lymphatic: Significant atherosclerosis of the distal
abdominal aorta. No pathologic adenopathy.

Reproductive: Prostate is unremarkable.

Other: There is a large left retroperitoneal hematoma, which
displaces the left kidney anteriorly. The hematoma measures 7.4 x
12.4 cm in transverse dimension, and extends approximately 22 cm in
craniocaudal length. Hematocrit level is seen within the
retroperitoneal fluid collection. Evaluation is limited without IV
contrast.

There is mild diffuse subcutaneous edema. Trace free fluid within
the right upper quadrant and pelvis.

Musculoskeletal: There are no acute displaced fractures.
Reconstructed images demonstrate no additional findings.
IMPRESSION: 1. Large left retroperitoneal hematoma.
2. Trace ascites.
3. Small bilateral pleural effusions with compressive lower lobe
atelectasis.
4. Cholelithiasis without cholecystitis.

These results were called by telephone at the time of interpretation
on [DATE] at [DATE] to provider I-CHIEH , who verbally
acknowledged these results.

## 2019-07-10 MED ORDER — SODIUM CHLORIDE 0.9% IV SOLUTION: Freq: Once | INTRAVENOUS | Status: AC

## 2019-07-10 MED ORDER — SODIUM CHLORIDE 0.9 % IV SOLN
2.0000 g | INTRAVENOUS | Status: AC
  Administered 2019-07-10 – 2019-07-15 (×6): 2 g via INTRAVENOUS
  Filled 2019-07-10 (×2): qty 2
  Filled 2019-07-10: qty 20
  Filled 2019-07-10 (×3): qty 2

## 2019-07-10 MED ORDER — CALCIUM GLUCONATE-NACL 2-0.675 GM/100ML-% IV SOLN
2.0000 g | Freq: Once | INTRAVENOUS | Status: AC
  Administered 2019-07-10: 2000 mg via INTRAVENOUS
  Filled 2019-07-10: qty 100

## 2019-07-10 MED ORDER — SODIUM CHLORIDE 0.9% IV SOLUTION: Freq: Once | INTRAVENOUS | Status: DC

## 2019-07-10 MED ORDER — VITAMIN K1 10 MG/ML IJ SOLN
10.0000 mg | Freq: Once | INTRAVENOUS | Status: AC
  Administered 2019-07-10: 10 mg via INTRAVENOUS
  Filled 2019-07-10: qty 1

## 2019-07-10 MED FILL — Medication: Qty: 1 | Status: AC

## 2019-07-10 NOTE — Progress Notes (Signed)
Patient transported on ventilator to CT and back to 4N30. Unable to perform CT at this time. Returned patient to room with no complications.

## 2019-07-10 NOTE — Procedures (Addendum)
Patient Name:Seth Boyd IRS:[id number] Epilepsy Attending:Flynt Breeze Annabelle Harman Referring Physician/Provider:Dr. Georgiana Spinner Aroor Duration:07/09/2019 1703 to 07/10/2019 1703  Patient history:84 y.o.malewith PMH DM who presented to ED with c/o aphasia and right side weakness. CTH was negative for hemorrhage.  Has been having waxing and waning mental status. EEG to evaluate for seizure  Level of alertness:comatose  AEDs during EEG study:LEV  Technical aspects: This EEG study was done with scalp electrodes positioned according to the 10-20 International system of electrode placement. Electrical activity was acquired at a sampling rate of 500Hz  and reviewed with a high frequency filter of 70Hz  and a low frequency filter of 1Hz . EEG data were recorded continuously and digitally stored.  DESCRIPTION: EEG showed continuous generalized 3-5Hz  theta-delta slowing. Triphasic waves, generalized maximal bifrontal were also noted. Hyperventilation and photic stimulation were not performeddue to ams.   Of note, around 1735 on 07/09/2019, EEG was showing continuous generalized 3-5Hz  theta-delta slowing as well as triphasic waves. Gradually the slowing became low amplitude to the point of generalized eeg background suppression. Gradually after around 1820 the eeg started to show low amplitude continuous generalized slowing again. On review of patient's vitals in chart, it appears that his BP dropped to 34/26, HR 33.   Event button was pressed multiple times during the study as described below  2134: Unclear reason, on video patient appears to have distressed breathing 0215: right shoulder intermittent twitching followed by face subtle jerks towards left. Then patient is noted to have left shoulder intermittent twitching and bilateral shoulder twitching. RN was noted to place her hand on left shoulder but that didn't stop the jerking.  0307: right shoulder intermittent jerking 0358:  right  shoulder intermittent jerking 0711: LEFT shoulder and RIGHT forearm twitching simultaneously  Concomitant eeg before, during and after all these events did not show any eeg change to suggest seizure.  ABNORMALITY - Continuous slow, generalized -Triphasic waves, generalized, maximal bifrontal  IMPRESSION: This study  issuggestive of moderate diffuse encephalopathy,non specific to etiology but could be secondary to toxic-metabolic causes.    Multiple events of left and right shoulder twitching were recorded as described abovewithout concomitant EEG change and were likely nonepileptic.  Of note, focal motor seizures may not be seen on scalp EEG.  However the semiology of these episodes (the intermittent nature, movement from left to right shoulder, one episode with right forearm and left shoulder twitching simultaneously) make it less suspicious for focal motor seizures.  No epileptiform discharges were seen throughout the recording.  Another event was recorded on 07/09/2019 at 1535 during which EEG initially showed continuous generalized slowing followed by generalized suppression.  Concomitant vital signs showed severe hypertosion and bradycardia.  This was most likely an orthostatic/cardiac/vasogenic event leading to hypoperfusion and subsequent EEG suppression.  No evidence of seizure was noted on video and on EEG before during or after the event.  Dr. 09/06/2019 was notified.  [name] 2135

## 2019-07-10 NOTE — Progress Notes (Signed)
CRITICAL VALUE ALERT  Critical Value:  Hgb 6.9  Date & Time Notied:  2/11 0600  Provider Notified:    Elink RN   Orders Received/Actions taken:  Currently tranfusing 1 PRBC

## 2019-07-10 NOTE — Progress Notes (Signed)
SLP Cancellation Note  Patient Details Name: Deo Mehringer MRN: [id number] DOB: 06-Nov-1934   Cancelled treatment:       Reason Eval/Treat Not Completed: Medical issues which prohibited therapy - remains on vent this morning. Will continue to follow.    Mahala Menghini., M.A. CCC-SLP Acute Rehabilitation Services Pager [phone] Office [phone]  07/10/2019, 7:53 AM

## 2019-07-10 NOTE — Progress Notes (Signed)
PT Cancellation Note  Patient Details Name: Seth Boyd MRN: [id number] DOB: Oct 14, 1934   Cancelled Treatment:    Reason Eval/Treat Not Completed: Medical issues which prohibited therapy; patient with decline, currently intubated and not responsive.  Will sign off for now.  Please order PT again when stable for mobility.     Elray Mcgregor 07/10/2019, 11:01 AM  Sheran Lawless, PT Acute Rehabilitation Services [phone] 07/10/2019

## 2019-07-10 NOTE — Progress Notes (Signed)
Reason for consult: Encephalopathy  Subjective: Patient was in shock yesterday, due to acute blood loss anemia -likely from retroperitoneal hematoma although unclear at this point.   ROS:  Unable to obtain due to poor mental status  Examination  Vital signs in last 24 hours: Temp:  [97.3 F (36.3 C)-99.9 F (37.7 C)] 98.1 F (36.7 C) (02/11 1445) Pulse Rate:  [82-128] 82 (02/11 1529) Resp:  [19-34] 21 (02/11 1529) BP: (34-181)/(24-113) 153/71 (02/11 1529) SpO2:  [82 %-100 %] 99 % (02/11 1529) FiO2 (%):  [30 %-50 %] 40 % (02/11 1529)  General: lying in bed CVS: pulse-normal rate and rhythm RS: breathing comfortably Extremities: normal   Neuro: MS: Unresponsive, not following any commands not tracking examiner CN:  pupils are equal and reactive, doll's reflex present, gag reflex present Motor: Withdraws minimally in all 4 extremities Plantars: mute  Basic Metabolic Panel: Recent Labs  Lab 07/08/19 0404 07/08/19 1143 07/08/19 1520 07/08/19 1612 07/08/19 2219 07/08/19 2219 07/09/19 0630 07/09/19 0630 07/09/19 1241 07/09/19 1802 07/09/19 1820 07/09/19 2255 07/10/19 0500  NA 144   < > 143   < > 143   < > 145   < > 143 143 157* 145 145  K 2.6*   < > 3.1*   < > 4.6   < > 4.5   < > 5.0 5.4* 4.7 4.4 5.0  CL 99  --  103   < > 105  --  106  --  108  --  109  --  102  CO2 31  --  26   < > 28  --  24  --  14*  --  28  --  24  GLUCOSE 221*  --  194*   < > 255*  --  426*  --  487*  --  182*  --  172*  BUN 15  --  15   < > 19  --  29*  --  32*  --  31*  --  45*  CREATININE 0.58*  --  0.62   < > 0.58*  --  1.16  --  1.74*  --  1.65*  --  2.22*  CALCIUM 8.9  --  8.7*   < > 8.5*   < > 8.3*   < > 7.9*  --  7.2*  --  7.7*  MG 1.5*  --   --   --   --   --  1.9  --   --   --  2.4  --  2.2  PHOS 2.6  --  3.4  --   --   --  3.1  --   --   --   --   --  4.7*   < > = values in this interval not displayed.    CBC: Recent Labs  Lab 07/09/19 1813 07/09/19 1813 07/09/19 2230  07/09/19 2255 07/10/19 0500 07/10/19 1000 07/10/19 1500  WBC 26.9*  --  27.1*  --  24.6* 23.1* 18.5*  HGB 5.1*   < > 8.4* 7.8* 6.9* 8.1* 7.2*  HCT 15.9*   < > 25.3* 23.0* 20.5* 24.3* 21.5*  MCV 93.5  --  92.3  --  91.5 89.7 89.6  PLT 289  --  227  --  173 174 127*   < > = values in this interval not displayed.     Coagulation Studies: Recent Labs    07/09/19 1854 07/10/19 0500 07/10/19 1500  LABPROT 28.6*  23.9* 21.7*  INR 2.7* 2.1* 1.9*    EEG IMPRESSION: This study issuggestive of moderate diffuse encephalopathy,non specific to etiology but could be secondary to toxic-metabolic causes.    Multiple events of left and right shoulder twitching were recorded as described abovewithout concomitant EEG change and were likely nonepileptic.  Of note, focal motor seizures may not be seen on scalp EEG.  However the semiology of these episodes (the intermittent nature, movement from left to right shoulder, one episode with right forearm and left shoulder twitching simultaneously) make it less suspicious for focal motor seizures.  No epileptiform discharges were seen throughout the recording.  Another event was recorded on 07/09/2019 at 1535 during which EEG initially showed continuous generalized slowing followed by generalized suppression.  Concomitant vital signs showed severe hypertension and bradycardia.  This was most likely an orthostatic/cardiac/vasogenic event leading to hypoperfusion and subsequent EEG suppression.  No evidence of seizure was noted on video and on EEG before during or after the event.    ASSESSMENT AND PLAN  85 year old male with diabetes mellitus presented with aphasia and flaccid right-sided weakness on 1/31.Wassubsequently intubated due to lethargic state. MRI brain was negative for acute stroke. Initial EEG as well as long-term EEG negative for epileptiform discharges. Patient was extubated on 2/2 and was following simple commands. Patient was  continued on Keppra 500mg  twice daily. Neurology signed off and plan to involve as outpatient.  Since then,patient would have unexplained episodes of hypoxia as well as somnolence. Also developed new onset atrial flutter with episodes of bradycardia.Was reconsulted today after the patient was havingepisodes of apnea when he became somnolent,on my assessment patient was awake and following commands. ABG not suggestive of hypercarbia therefore making myasthenia gravis/hypercarbic respiratory failure unlikely.  Patient was being taken for repeat MRI brain,suddenly hadPEAarrest and was coded. 3 minutes,patient intubated but now following commands.Long-term EEG placed to look for subclinical seizures, none detected so far.  Suspect current neurological deterioration in the setting of hypoxia, worsening shock, patient's hemoglobin actually dropped-improved after PRBC and currently.  CCM suspecting acute blood loss from retroperitoneal hematoma.   Acute metabolic encephalopathy Suspected seizure on presentation Unexplained episodes of apnea PEA arrest Shock - 2/2 acute blood loss anemia Acute kidney injury Elevated liver enzymes due to shock liver Metabolic acidosis Gram-negative pneumonia-Enterobacter in respiratory culture    Recommendations Continue long-term EEG, patient had 1 episode of sudden blood pressure drop:  Cardiogenic versus vagal syncope? No seizures in EEG recording  Stop heparin drip and obtain CT head when possible to r/o hemorrhage for neurological decline although suspect it is reflective of patient currently being in shock Management of shock, hyperglycemia per CCM, agree with evaluation for ischemic bowel   Alik Mawson [facility] Pager Number [phone] For questions after 7pm please refer to AMION to reach the Neurologist on call

## 2019-07-10 NOTE — Progress Notes (Addendum)
Nutrition Follow-up  DOCUMENTATION CODES:   Severe malnutrition in context of chronic illness  INTERVENTION:   Recommend initiate Osmolite 1.2 @ 60 ml/hr via cortrak tube 30 ml Prostat daily  Provides: 1828 kcal, 94 grams protein, and 1167 ml free water.    NUTRITION DIAGNOSIS:   Severe Malnutrition related to (likely chronic illness) as evidenced by severe fat depletion, severe muscle depletion. Ongoing  GOAL:   Patient will meet greater than or equal to 90% of their needs Not met.   MONITOR:   I & O's, TF tolerance  REASON FOR ASSESSMENT:   Ventilator    ASSESSMENT:   Pt with PMH of HTN and DM admitted with acute encephalopathy.    Per MD pt admitted as code stroke and treated for szs which was negative. Possible retroperitoneal bleed. Weaning on pressors. CT of abdomen pending. Per MD may need trach.   2/5 cortrak placed but seemed to be clogged 2/6 small bore feeding tube placed by IR 2/8 extubated 2/9 cortrak placed, tip gastric; cardiac arrest in MRI, intubated   Patient is currently intubated on ventilator support MV: 10.9 L/min Temp (24hrs), Avg:99.2 F (37.3 C), Min:97.3 F (36.3 C), Max:99.9 F (37.7 C) MAP: 82  Medications reviewed: 5 units levemir BID, vitamin B12 Levo @ 6 mcg, neo stopped  Labs reviewed:  CBG's: 129-151    Diet Order:   Diet Order            Diet NPO time specified  Diet effective now              EDUCATION NEEDS:   No education needs have been identified at this time  Skin:  Skin Assessment: Reviewed RN Assessment  Last BM:  unknown  Height:   Ht Readings from Last 1 Encounters:  06/16/2019 5' 10"  (1.778 m)    Weight:   Wt Readings from Last 1 Encounters:  07/09/19 68.3 kg    Ideal Body Weight:  75.4 kg  BMI:  Body mass index is 21.61 kg/m.  Estimated Nutritional Needs:   Kcal:  1761  Protein:  90-120 grams  Fluid:  2 L/day  Lockie Pares., RD, LDN, CNSC See AMiON for contact information

## 2019-07-10 NOTE — Progress Notes (Signed)
Pharmacy Antibiotic Note  Seth Boyd is a 85 y.o. male admitted on 06/17/2019 with pneumonia.  Pharmacy has been consulted for Rocephin dosing.  Trach aspirate from 2/8 growing enterobacter - pan - susceptible   Plan: D/C Vanc and Zosyn Start Rocephin 2 gm IV q24hr  Height: 5\' 10"  (177.8 cm) Weight: 150 lb 9.2 oz (68.3 kg) IBW/kg (Calculated) : 73  Temp (24hrs), Avg:99.3 F (37.4 C), Min:97.3 F (36.3 C), Max:99.9 F (37.7 C)  Recent Labs  Lab 07/08/19 0404 07/08/19 0404 07/08/19 1520 07/08/19 1520 07/08/19 2219 07/09/19 0630 07/09/19 1241 07/09/19 1630 07/09/19 1813 07/09/19 1820 07/09/19 2144 07/09/19 2230 07/10/19 0500  WBC 6.0  --  11.0*  --   --   --   --   --  26.9*  --   --  27.1* 24.6*  CREATININE 0.58*   < > 0.62   < > 0.58* 1.16 1.74*  --   --  1.65*  --   --  2.22*  LATICACIDVEN  --   --   --   --   --   --  >11.0* >11.0*  --   --  >11.0*  --   --    < > = values in this interval not displayed.    Estimated Creatinine Clearance: 24.8 mL/min (A) (by C-G formula based on SCr of 2.22 mg/dL (H)).    No Known Allergies  Antimicrobials this admission: Zosyn 2/10 >>2/11 Vanc 2/10>>2/11 Rocephin 2/11>>  Microbiology results:  2/8 Sputum: few enterobacter - pan susceptible   Thank you for allowing pharmacy to be a part of this patient's care.  4/8, PharmD, [facility] Clinical Pharmacist Please see AMION for all Pharmacists' Contact Phone Numbers 07/10/2019, 9:14 AM

## 2019-07-10 NOTE — Progress Notes (Signed)
Patient was transported to CT & back to 4N30 without any complications.

## 2019-07-10 NOTE — Plan of Care (Signed)
Pt event button pushed around 0215 for left arm twitching. EEG reviewed - no electrographic seizure seen during the event - final read in the AM.   No need for benzo or AEDs at this time.  Notify neurology should this recur or any new symptoms/neurochange.  -- Milon Dikes, MD [facility]

## 2019-07-10 NOTE — Progress Notes (Addendum)
The patient has been seen in conjunction with Cecilie Kicks, NP. All aspects of care have been considered and discussed. The patient has been personally interviewed, examined, and all clinical data has been reviewed.   Unfortunately, Mr. Turay continues to have 1 significant event after another.  Hemorrhagic shock now being treated.  No new cardiac recommendations.  We will continue to follow electronically and are available if needed.  [facility] will sign off.   Medication Recommendations: None. Other recommendations (labs, testing, etc): None Follow up as an outpatient: To be determined    Progress Note  Patient Name: Seth Boyd Date of Encounter: 07/10/2019  Primary Cardiologist: Mertie Moores, MD   Subjective   Sedated on Vent tube feedings on hold for CT abd  Inpatient Medications    Scheduled Meds: . sodium chloride   Intravenous Once  . sodium chloride   Intravenous Once  . sodium chloride   Intravenous Once  . aspirin  81 mg Per Tube Daily  . atorvastatin  40 mg Per Tube QHS  . chlorhexidine gluconate (MEDLINE KIT)  15 mL Mouth Rinse BID  . Chlorhexidine Gluconate Cloth  6 each Topical Daily  . insulin aspart  2-6 Units Subcutaneous Q4H  . insulin detemir  5 Units Subcutaneous Q12H  . mouth rinse  15 mL Mouth Rinse 10 times per day  . pantoprazole sodium  40 mg Per Tube Q1200  . sodium chloride flush  10-40 mL Intracatheter Q12H  . vitamin B-12  2,000 mcg Per Tube Daily   Continuous Infusions: . sodium chloride Stopped (07/10/19 0240)  . sodium chloride    . sodium chloride    . cefTRIAXone (ROCEPHIN)  IV 2 g (07/10/19 0940)  . levETIRAcetam 500 mg (07/10/19 1130)  . norepinephrine (LEVOPHED) Adult infusion 6 mcg/min (07/10/19 0700)  . phenylephrine (NEO-SYNEPHRINE) Adult infusion Stopped (07/09/19 2242)  . phytonadione (VITAMIN K) IV 10 mg (07/10/19 1250)   PRN Meds: sodium chloride, acetaminophen, acetaminophen, bisacodyl, dextrose,  docusate, fentaNYL (SUBLIMAZE) injection, fentaNYL (SUBLIMAZE) injection, hydrALAZINE, sodium chloride flush   Vital Signs    Vitals:   07/10/19 1200 07/10/19 1220 07/10/19 1235 07/10/19 1245  BP:  (!) 143/62 (!) 154/65   Pulse: (!) 105 98 91 (!) 101  Resp: (!) 23 (!) 21 (!) 23 (!) 23  Temp: 98.2 F (36.8 C) 98.4 F (36.9 C) 98.4 F (36.9 C) 98.4 F (36.9 C)  TempSrc:      SpO2: 100% (!) 82% 100% 100%  Weight:      Height:        Intake/Output Summary (Last 24 hours) at 07/10/2019 1323 Last data filed at 07/10/2019 0850 Gross per 24 hour  Intake 6097.27 ml  Output 150 ml  Net 5947.27 ml   Last 3 Weights 07/09/2019 07/08/2019 07/07/2019  Weight (lbs) 150 lb 9.2 oz 158 lb 8.2 oz 154 lb 15.7 oz  Weight (kg) 68.3 kg 71.9 kg 70.3 kg      Telemetry    ST today, last night with acute hypotension HR to 122, difficult to see P waves but believe ST with PACs, he has had runs of SVT today as well about 6 secs.   - Personally Reviewed  ECG    No new - Personally Reviewed  Physical Exam   GEN: No acute distress. Sedated on vent with ET tube orally nad feeding tube clamped   Neck: No JVD Cardiac: RRR, no murmurs, rubs, or gallops.  Respiratory: Clear to auscultation bilaterally. GI:  Soft, nontender, non-distended  MS: tr to 1+ edema; No deformity. Neuro:  Nonfocal  Psych: Normal affect   Labs    High Sensitivity Troponin:   Recent Labs  Lab 07/08/19 1520  TROPONINIHS 18*      Chemistry Recent Labs  Lab 07/09/19 0630 07/09/19 0630 07/09/19 1241 07/09/19 1802 07/09/19 1820 07/09/19 2255 07/10/19 0500  NA 145   < > 143   < > 157* 145 145  K 4.5   < > 5.0   < > 4.7 4.4 5.0  CL 106   < > 108  --  109  --  102  CO2 24   < > 14*  --  28  --  24  GLUCOSE 426*   < > 487*  --  182*  --  172*  BUN 29*   < > 32*  --  31*  --  45*  CREATININE 1.16   < > 1.74*  --  1.65*  --  2.22*  CALCIUM 8.3*   < > 7.9*  --  7.2*  --  7.7*  PROT  --   --  4.0*  --  3.8*  --   --     ALBUMIN 2.0*  --  1.6*  --  1.6*  --   --   AST  --   --  113*  --  1,074*  --   --   ALT  --   --  95*  --  932*  --   --   ALKPHOS  --   --  79  --  76  --   --   BILITOT  --   --  0.9  --  0.8  --   --   GFRNONAA 58*   < > 36*  --  38*  --  27*  GFRAA >60   < > 41*  --  44*  --  31*  ANIONGAP 15   < > 21*  --  20*  --  19*   < > = values in this interval not displayed.     Hematology Recent Labs  Lab 07/09/19 2230 07/09/19 2230 07/09/19 2255 07/10/19 0500 07/10/19 1000  WBC 27.1*  --   --  24.6* 23.1*  RBC 2.74*  --   --  2.24* 2.71*  HGB 8.4*   < > 7.8* 6.9* 8.1*  HCT 25.3*   < > 23.0* 20.5* 24.3*  MCV 92.3  --   --  91.5 89.7  MCH 30.7  --   --  30.8 29.9  MCHC 33.2  --   --  33.7 33.3  RDW 13.7  --   --  14.1 14.3  PLT 227  --   --  173 174   < > = values in this interval not displayed.    BNPNo results for input(s): BNP, PROBNP in the last 168 hours.   DDimer No results for input(s): DDIMER in the last 168 hours.   Radiology    MR BRAIN WO CONTRAST  Result Date: 07/08/2019 CLINICAL DATA:  Encephalopathy. EXAM: MRI HEAD WITHOUT CONTRAST TECHNIQUE: Multiplanar, multiecho pulse sequences of the brain and surrounding structures were obtained without intravenous contrast. COMPARISON:  MRI of the brain June 30, 2019. FINDINGS: This study is degraded by motion. Brain: No acute infarction, hemorrhage, extra-axial collection or mass lesion. Scattered and confluent foci of T2 hyperintensity are seen within the white matter of the cerebral hemispheres, nonspecific, most likely  related to chronic small vessel ischemia, unchanged from prior MRI. Foci of susceptibility artifact are noted in the right frontal level and left occipital lobe, likely represent hemosiderin deposits, unchanged from prior MRI. There is marked prominence of the ventricular system, cerebral and cerebellar sulci, consistent with parenchymal volume loss. Vascular: Normal flow voids. Skull and upper cervical  spine: Normal marrow signal. Sinuses/Orbits: Bilateral mastoid effusions. Right lens surgery. Other: None. IMPRESSION: 1. No acute intracranial abnormality. 2. Marked parenchymal volume loss. 3. Moderate chronic small vessel ischemia. 4. Bilateral mastoid effusions. Electronically Signed   By: Pedro Earls M.D.   On: 07/08/2019 15:36   DG CHEST PORT 1 VIEW  Result Date: 07/09/2019 CLINICAL DATA:  Acute respiratory failure. EXAM: PORTABLE CHEST 1 VIEW COMPARISON:  Chest radiograph 07/08/2019 FINDINGS: Interval placement of a right central venous catheter with tip projecting over the distal SVC. Otherwise stable support apparatus. Stable cardiomediastinal contours. Normal heart size. The lungs are clear. No pneumothorax or large pleural effusion. No acute finding in the visualized skeleton. IMPRESSION: No acute finding.  Support apparatus in appropriate position. Electronically Signed   By: Audie Pinto M.D.   On: 07/09/2019 15:08   DG Chest Port 1 View  Result Date: 07/08/2019 CLINICAL DATA:  Status post endotracheal intubation EXAM: PORTABLE CHEST 1 VIEW COMPARISON:  Film from earlier in the same day. FINDINGS: Feeding catheter is noted extending towards the stomach. Endotracheal tube is noted in satisfactory position. The lungs are well aerated bilaterally. Mild increased density is noted in the right base consistent with atelectasis similar to that noted on the prior exam. Previously seen effusions are not well appreciated. No bony abnormality is noted. IMPRESSION: Persistent right basilar atelectasis. Tubes and lines in satisfactory position. Electronically Signed   By: Inez Catalina M.D.   On: 07/08/2019 15:44   Overnight EEG with video  Result Date: 07/09/2019 Lora Havens, MD     07/10/2019  9:56 AM Patient Name:Demontez Stuber LNL:[id number] Epilepsy Attending:Priyanka Barbra Sarks Referring Physician/Provider:Dr. Karena Addison Aroor Duration: 07/08/2019 1703 to 07/09/2019 1703   Patient EYCXKGY:85 y.o.malewith PMH DM who presented to ED with c/o aphasia and right side weakness. CTH was negative for hemorrhage.  Has been having waxing and waning mental status. EEG to evaluate for seizure  Level of alertness:awake, asleep  AEDs during EEG study:LEV  Technical aspects: This EEG study was done with scalp electrodes positioned according to the 10-20 International system of electrode placement. Electrical activity was acquired at a sampling rate of 500Hz  and reviewed with a high frequency filter of 70Hz  and a low frequency filter of 1Hz . EEG data were recorded continuously and digitally stored.  DESCRIPTION: During awake state, no clear posterior dominant rhythm was seen.  Sleep was characterized by vertex waves, sleep spindles (13 to 15 Hz), maximal frontocentral.  EEG showed continuous generalized rhythmic 2 to 3 Hz delta slowing as well as intermittent generalized 5 to 6 Hz delta slowing.  Hyperventilation and photic stimulation were not performeddue to ams. Ventilator artifact was also noted at times.  ABNORMALITY -Continuous rhythmic slow, generalized  IMPRESSION: This study  issuggestive of moderate diffuse encephalopathy,non specific to etiology.No seizures or epileptiform discharges were seen throughout the recording.  Lora Havens    Cardiac Studies   ECHO: 07/04/2019 1. Left ventricular ejection fraction, by visual estimation, is 65 to  70%. The left ventricle has hyperdynamic function. There is no left  ventricular hypertrophy.  2. Left ventricular diastolic parameters are indeterminate.  3.  Global right ventricle has normal systolic function.The right  ventricular size is normal.  4. Left atrial size was normal.  5. Right atrial size was normal.  6. The mitral valve is normal in structure. No evidence of mitral valve  regurgitation.  7. The tricuspid valve is normal in structure. Tricuspid valve  regurgitation is not demonstrated.  8. The  aortic valve was not well visualized. Aortic valve regurgitation  is not visualized. No evidence of aortic valve stenosis.  9. The pulmonic valve was not well visualized. Pulmonic valve  regurgitation is not visualized.   Patient Profile     85 y.o. male with hx HTN, DM, admitted 01/31 with stroke-like sx (MRI neg CVA), encephalopathy possibly 2nd Sz, 02/04 had new onset atrial fibrillation/atrial flutter.Was sent to MRI 02/09 because MS was deteriorating>>cardiac arrest in MRI.  Assessment & Plan    1. Atrial fib/flutter: - was in SR till after code, then fib>>flutter - now back in SR to ST at times may have a flutter difficult to see P wave vs flutter wave -no BB or Ca+ channel markers  - on heparin then hemorrhagic shock.  CT abd pending no anticoagulation now - possible spontaneous retroperitoneal bleed.   2. Encephalopathy - cause unknown - MRI repeated today w/out acute abnormality - ?Sz, but none witnessed - concern for toxic encephalopathy, but no toxins found so far - Neuro seeing no seizures on 24 hour EEG - had been extubated post arrest, then re-intubated for the 2nd time after CPR  3. Cardiac arrest - pt was off the monitor for the MRI - according to the notes, he became bradycardic>>PEA at 14:50, ROSC at 15:00 - pt lost consciousness, was in junctional brady when put back on the monitor - a great deal of vent ectopy seen, short runs NSVT - Afib>>Aflutter >>SR to ST with PACs - as he had had problems maintaining his airway and MS had deteriorated today, suspect hypoxia as cause of cardiac arrest - Also concern for hypokalemia as a cause, possible VT or torsades. Now 07/10/19  K+ 5.0 Mg+ 1.9  - However, would not expect the presenting rhythm to be junctional brady when placed back on the monitor if he had been in VT/Torsades/VF - Will continue to follow - BP soft at times.     4.  Respiratory failure on Vent and sedated, managed by CCM   5. Hemorrhagic  shock 05/07/20 with Hgb to 5.1 presumed retroperitoneal bleed.  IV heparin reversed.  Yesterday pm.  Neo synephrine added  Transfused 3 units PRBCs and 2 units FFP  And now weaning pressors to off and Hgb today 8.1  BP now 140/67 to 126/92     6.  LFTs  Elevated today.    AST 1074 and ALT 932 due to #5 yesterday INR at 2.1  For questions or updates, please contact [facility] Please consult [email or web address] for contact info under        Signed, Cecilie Kicks, NP  07/10/2019, 1:23 PM

## 2019-07-10 NOTE — Progress Notes (Signed)
Late entry: LTM maint complete - no skin breakdown under:  FP1,A2,P3,F3,

## 2019-07-10 NOTE — Progress Notes (Signed)
eLink Physician-Brief Progress Note Patient Name: Seth Boyd DOB: 09-17-1934 MRN: [id number]   Date of Service  07/10/2019  HPI/Events of Note  Pt has maintained blood pressure but repeat Lactate is 11.0 and his last hemoglobin is 7.8 gm, ionized calcium 1.07.  eICU Interventions  Transfuse an additional unit of PRBC, Calcium gluconate 2 gm iv x 1, follow up PT-INR result        Joshuwa Vecchio U Britni Driscoll 07/10/2019, 3:32 AM

## 2019-07-10 NOTE — Progress Notes (Signed)
NAME:  Seth Boyd, MRN:  [id number], DOB:  Nov 13, 1934, LOS: 35 ADMISSION DATE:  04-Jul-2019, CONSULTATION DATE:  07/04/2019 REFERRING MD:  Lorin Mercy, CHIEF COMPLAINT:  Encephalopathy   Brief History   85 y.o. male, goes by Seth Boyd, with prior hx HTN/ DM presenting after being found on floor with acute encephalopathy.  Presented as code stroke.  Noted to have initial left gaze.  LSW 1/30 ~4 pm.  Rule out for stroke with negative CTH/ CTA.  Concern for seizure +/- toxic/ metabolic encephalopathy.    Past Medical History  HTN, DM  Significant Hospital Events   1/31 Admit 2/02 extubated 2/03 difficulty with agitation and airway protection, weak cough 2/04 reintubated early am, a flutter New onset a fib/flutter - on heparin 2/5 issues with foley leaking  Started back on propofol  2/6 stable Apneic with PSV  2/8 Extubated; PEA arrest in MRI 3 mins duration. Re-intuabted 2/9 shock developed, hemorrhagic. Suspect RP bleed    Consults:  Neurology Cardiology  Procedures:  1/31 ETT >> 2/02 2/04 ETT >> 2/8 2/8 ETT >>  Significant Diagnostic Tests:  1/31 CT head/neck >> 30% ICA stenosis on the right, 20% on the left, 30-50% narrowing in both carotid siphon regions but no correctable proximal stenosis. 1/31 EEG >> moderate diffuse encephalopathy, no epileptiform activity 2/01 brain MRI >> Moderate cerebral atrophy with chronic microvascular ischemic disease, Soft tissue edema within the right occipital/suboccipital scalp. 2/01 LTM  >> moderate diffuse encephalopathy 2/05 Echo >> LVEF 65-70%. Otherwise, unimpressive cxr 07/04/19 Bibasilar infiltrates consistent with pneumonia or aspiration, RIGHT greater than LEFT, slightly increased on RIGHT since prior study MRI 2/8 > No acute intracranial abnormality. Marked parenchymal volume loss. Moderate chronic small vessel ischemia. EEG 2/8 >>> no seizure identified  Micro Data:  1/31 SARS 2/ flu A/B >> neg 1/31 MRSA PCR >> negative 2/8 resp >>  Few enterobacter  Antimicrobials:  Unasyn 2/02 >> 2/8 Zosyn 2/9 > 2/10 Vanco 2/9 > 2/10  Ceftriaxone 2/10 >   Interim history/subjective:  Worsening shock yesterday. Hemoglobin 5.1 Transfused 3 units PRBC, 1 FFP, heparin stopped and given protamine.    Objective   Blood pressure (!) 150/68, pulse (!) 102, temperature 98.6 F (37 C), resp. rate (!) 24, height 5\' 10"  (1.778 m), weight 68.3 kg, SpO2 99 %.    Vent Mode: PSV;CPAP FiO2 (%):  [30 %-50 %] 40 % Set Rate:  [12 bmp-24 bmp] 24 bmp Vt Set:  [510 mL] 510 mL PEEP:  [5 cmH20] 5 cmH20 Pressure Support:  [10 cmH20] 10 cmH20 Plateau Pressure:  [17 cmH20-20 cmH20] 17 cmH20   Intake/Output Summary (Last 24 hours) at 07/10/2019 0842 Last data filed at 07/10/2019 0700 Gross per 24 hour  Intake 5741.27 ml  Output 125 ml  Net 5616.27 ml   Filed Weights   07/07/19 0354 07/08/19 0254 07/09/19 0351  Weight: 70.3 kg 71.9 kg 68.3 kg   Examination:  General:  Elderly male on vent HEENT: [place]/AT, PERRL, no JVD Neuro: Not following commands this morning. R and L arm twitching.  CV: RRR, no MRG PULM: Coarse bilateral breath sounds GI: soft, bs+, condom cath  Extremities: warm/dry, no LE edema, atrophy  Skin: grossly intact   Resolved Hospital Problem list   Rhabdomyolysis  Assessment & Plan:   Acute hypoxic respiratory failure with compromised airway: he continues to fail extubation most recently suffering cardiac arrest while in MRI 2/9. Reasoning not well understood. Seems to be having central apneas that are not well  explained.  - Full vent support - Wean as tolerated - Will need to do some more investigation prior to re-attempting extubation  - VAP bundle - Neuro now following again   Hemorrhagic shock: source not well defined. Concern for RP bleed. Has received 3 units PRBC and 1 unit FFP. Heparin reversed.  - Serial CBC - Scan abdomen/pelvis if stable - Levo to keep MAP >  Aspiration pneumonia - completed 7  days of Unasyn 2/8. Culture now growing enterobacter pan sensitive - Narrow to CTX - Await susceptibilities - follow trach aspirate from 2/8  Acute metabolic encephalopathy: thus far, thought possibly related to seizure but since hospitalization, no clinical seizures observed, EEG negative thus far, imaging negative, some arrhythmias - aflutter/ Afib (initial event could have been a syncope), really unclear this at this point.  - continue keppra.  - Neuro following.  - PRN sedation for RASS goal 0 to -1.  - CT head pending  New onset Atrial flutter 2/4-spontaneous converted to sinus rhythm Episodes of bradycardia.  CHADSVASc =4 HTN, HLD. - cardiology following - remains in sinus, continue to monitor for arrhythmias that may contribute his encephalopathy - continue lipitor when able - prn IV hydralazine for SBP > 170 - heparin on hold  Dysphagia with difficulty placing OG/NG tube. Severe protein calorie malnutrition. - TF - SLP when encephalopathy improved  DM type 2 poorly controlled. - insulin infusion  AKI, hopeful this will improve with shock improvement.  - Follow BMP - May need renal involvement if worsens.   Hypokalemia with hypomagnesemia - monitor with TF given malnutrition, phos remains ok  - goal K >4, Mag > 2 - recheck in am    Concern for airway edema  - s/p decadron course, no stridor   Best practice:  Diet: tube feeds DVT prophylaxis: heparin gtt  GI prophylaxis: PPI  Glucose control: monitor Mobility: bed Code Status: full Disposition: ICU   Critical care time 50 minutes   Joneen Roach, AGACNP-BC [facility]  See Amion for personal pager PCCM on call pager [phone]  07/10/2019 8:42 AM

## 2019-07-11 DIAGNOSIS — N179 Acute kidney failure, unspecified: Secondary | ICD-10-CM | POA: Diagnosis present

## 2019-07-11 DIAGNOSIS — I495 Sick sinus syndrome: Secondary | ICD-10-CM

## 2019-07-11 DIAGNOSIS — R7401 Elevation of levels of liver transaminase levels: Secondary | ICD-10-CM | POA: Diagnosis present

## 2019-07-11 LAB — PROTIME-INR
INR: 1.7 — ABNORMAL HIGH (ref 0.8–1.2)
Prothrombin Time: 20 seconds — ABNORMAL HIGH (ref 11.4–15.2)

## 2019-07-11 LAB — BPAM FFP
Blood Product Expiration Date: 202102142359
Blood Product Expiration Date: 202102142359
Blood Product Expiration Date: 202102142359
Blood Product Expiration Date: 202102142359
Blood Product Expiration Date: 202102152359
Blood Product Expiration Date: 202102152359
Blood Product Expiration Date: 202102162359
ISSUE DATE / TIME: 202102102200
ISSUE DATE / TIME: 202102102317
ISSUE DATE / TIME: 202102110043
ISSUE DATE / TIME: 202102110208
ISSUE DATE / TIME: 202102111213
ISSUE DATE / TIME: 202102111213
ISSUE DATE / TIME: 202102111743
Unit Type and Rh: 6200
Unit Type and Rh: 6200
Unit Type and Rh: 6200
Unit Type and Rh: 6200
Unit Type and Rh: 600
Unit Type and Rh: 6200
Unit Type and Rh: 600

## 2019-07-11 LAB — CBC
HCT: 24.7 % — ABNORMAL LOW (ref 39.0–52.0)
HCT: 25.8 % — ABNORMAL LOW (ref 39.0–52.0)
Hemoglobin: 8.3 g/dL — ABNORMAL LOW (ref 13.0–17.0)
Hemoglobin: 8.7 g/dL — ABNORMAL LOW (ref 13.0–17.0)
MCH: 29.7 pg (ref 26.0–34.0)
MCH: 29.9 pg (ref 26.0–34.0)
MCHC: 33.6 g/dL (ref 30.0–36.0)
MCHC: 33.7 g/dL (ref 30.0–36.0)
MCV: 88.5 fL (ref 80.0–100.0)
MCV: 88.7 fL (ref 80.0–100.0)
Platelets: 143 10*3/uL — ABNORMAL LOW (ref 150–400)
Platelets: 149 10*3/uL — ABNORMAL LOW (ref 150–400)
RBC: 2.79 MIL/uL — ABNORMAL LOW (ref 4.22–5.81)
RBC: 2.91 MIL/uL — ABNORMAL LOW (ref 4.22–5.81)
RDW: 15 % (ref 11.5–15.5)
RDW: 15.2 % (ref 11.5–15.5)
WBC: 19.4 10*3/uL — ABNORMAL HIGH (ref 4.0–10.5)
WBC: 20.2 10*3/uL — ABNORMAL HIGH (ref 4.0–10.5)
nRBC: 0.6 % — ABNORMAL HIGH (ref 0.0–0.2)
nRBC: 0.7 % — ABNORMAL HIGH (ref 0.0–0.2)

## 2019-07-11 LAB — PREPARE FRESH FROZEN PLASMA
Unit division: 0
Unit division: 0
Unit division: 0

## 2019-07-11 LAB — GLUCOSE, CAPILLARY
Glucose-Capillary: 120 mg/dL — ABNORMAL HIGH (ref 70–99)
Glucose-Capillary: 123 mg/dL — ABNORMAL HIGH (ref 70–99)
Glucose-Capillary: 131 mg/dL — ABNORMAL HIGH (ref 70–99)
Glucose-Capillary: 134 mg/dL — ABNORMAL HIGH (ref 70–99)
Glucose-Capillary: 136 mg/dL — ABNORMAL HIGH (ref 70–99)
Glucose-Capillary: 147 mg/dL — ABNORMAL HIGH (ref 70–99)
Glucose-Capillary: 147 mg/dL — ABNORMAL HIGH (ref 70–99)

## 2019-07-11 LAB — COMPREHENSIVE METABOLIC PANEL
ALT: 1975 U/L — ABNORMAL HIGH (ref 0–44)
AST: 1884 U/L — ABNORMAL HIGH (ref 15–41)
Albumin: 2.4 g/dL — ABNORMAL LOW (ref 3.5–5.0)
Alkaline Phosphatase: 110 U/L (ref 38–126)
Anion gap: 15 (ref 5–15)
BUN: 79 mg/dL — ABNORMAL HIGH (ref 8–23)
CO2: 28 mmol/L (ref 22–32)
Calcium: 7.8 mg/dL — ABNORMAL LOW (ref 8.9–10.3)
Chloride: 104 mmol/L (ref 98–111)
Creatinine, Ser: 3.11 mg/dL — ABNORMAL HIGH (ref 0.61–1.24)
GFR calc Af Amer: 21 mL/min — ABNORMAL LOW (ref >60)
GFR calc non Af Amer: 18 mL/min — ABNORMAL LOW (ref >60)
Glucose, Bld: 121 mg/dL — ABNORMAL HIGH (ref 70–99)
Potassium: 3.9 mmol/L (ref 3.5–5.1)
Sodium: 147 mmol/L — ABNORMAL HIGH (ref 135–145)
Total Bilirubin: 1.2 mg/dL (ref 0.3–1.2)
Total Protein: 5.1 g/dL — ABNORMAL LOW (ref 6.5–8.1)

## 2019-07-11 LAB — MAGNESIUM: Magnesium: 2.2 mg/dL (ref 1.7–2.4)

## 2019-07-11 LAB — PHOSPHORUS: Phosphorus: 4.4 mg/dL (ref 2.5–4.6)

## 2019-07-11 LAB — AMMONIA: Ammonia: 14 umol/L (ref 9–35)

## 2019-07-11 MED ORDER — OSMOLITE 1.2 CAL PO LIQD
1000.0000 mL | ORAL | Status: DC
  Administered 2019-07-12: 1000 mL
  Filled 2019-07-11 (×2): qty 1000

## 2019-07-11 MED ORDER — VITAMIN K1 10 MG/ML IJ SOLN
10.0000 mg | Freq: Once | INTRAVENOUS | Status: AC
  Administered 2019-07-11: 10 mg via INTRAVENOUS
  Filled 2019-07-11: qty 1

## 2019-07-11 MED ORDER — FUROSEMIDE 10 MG/ML IJ SOLN
80.0000 mg | Freq: Once | INTRAMUSCULAR | Status: AC
  Administered 2019-07-11: 80 mg via INTRAVENOUS
  Filled 2019-07-11: qty 8

## 2019-07-11 NOTE — Progress Notes (Signed)
LTM EEG discontinued -Skin breakdown at P4,F8 notified nurse on the floor pt RN busy.

## 2019-07-11 NOTE — Progress Notes (Signed)
SLP Cancellation Note  Patient Details Name: Jamir Rone MRN: [id number] DOB: Apr 05, 1935   Cancelled treatment:       Reason Eval/Treat Not Completed: Patient not medically ready. SLP to sign off for now. Please reorder when ready.   Mahala Menghini., M.A. CCC-SLP Acute Rehabilitation Services Pager [phone] Office [phone]  07/11/2019, 7:39 AM

## 2019-07-11 NOTE — Progress Notes (Signed)
NAME:  Seth Boyd, MRN:  [id number], DOB:  11-Apr-1935, LOS: 12 ADMISSION DATE:  06/07/2019, CONSULTATION DATE:  06/28/2019 REFERRING MD:  Ophelia Charter, CHIEF COMPLAINT:  Encephalopathy   Brief History   85 y.o. male, goes by Seth Boyd, with prior hx HTN/ DM presenting after being found on floor with acute encephalopathy.  Presented as code stroke.  Noted to have initial left gaze.  LSW 1/30 ~4 pm.  Rule out for stroke with negative CTH/ CTA.  Concern for seizure +/- toxic/ metabolic encephalopathy.    Past Medical History  HTN, DM  Significant Hospital Events   1/31 Admit 2/02 extubated 2/03 difficulty with agitation and airway protection, weak cough 2/04 reintubated early am, a flutter New onset a fib/flutter - on heparin 2/5 issues with foley leaking  Started back on propofol  2/6 stable Apneic with PSV  2/8 Extubated; PEA arrest in MRI 3 mins duration. Re-intuabted 2/9 shock developed, hemorrhagic. Suspect RP bleed    Consults:  Neurology Cardiology  Procedures:  1/31 ETT >> 2/02 2/04 ETT >> 2/8 2/8 ETT >>  Significant Diagnostic Tests:  1/31 CT head/neck >> 30% ICA stenosis on the right, 20% on the left, 30-50% narrowing in both carotid siphon regions but no correctable proximal stenosis. 1/31 EEG >> moderate diffuse encephalopathy, no epileptiform activity 2/01 brain MRI >> Moderate cerebral atrophy with chronic microvascular ischemic disease, Soft tissue edema within the right occipital/suboccipital scalp. 2/01 LTM  >> moderate diffuse encephalopathy 2/05 Echo >> LVEF 65-70%. Otherwise, unimpressive cxr 07/04/19 Bibasilar infiltrates consistent with pneumonia or aspiration, RIGHT greater than LEFT, slightly increased on RIGHT since prior study MRI 2/8 > No acute intracranial abnormality. Marked parenchymal volume loss. Moderate chronic small vessel ischemia. EEG 2/8 >>> no seizure identified EEG 2/11 > moderate diffuse encephalopathy CT head 2/11 > acute to early subacute  infarct of the left occipital lobe CT A / P 2/11 > large left RP hematoma, trace ascites, small b/l effusions with compressive atelectasis   Micro Data:  1/31 SARS 2/ flu A/B >> neg 1/31 MRSA PCR >> negative 2/8 resp >> Few enterobacter  Antimicrobials:  Unasyn 2/02 >> 2/8 Zosyn 2/9 > 2/10 Vanco 2/9 > 2/10  Ceftriaxone 2/10 >  Interim history/subjective:  BP improved. CTH with new left occipital infarct.  Objective   Blood pressure (!) 148/79, pulse 90, temperature 98.4 F (36.9 C), resp. rate 14, height 5\' 10"  (1.778 m), weight 83 kg, SpO2 100 %.    Vent Mode: PSV;CPAP FiO2 (%):  [30 %-40 %] 30 % Set Rate:  [24 bmp] 24 bmp Vt Set:  [510 mL] 510 mL PEEP:  [5 cmH20] 5 cmH20 Pressure Support:  [10 cmH20] 10 cmH20 Plateau Pressure:  [16 cmH20-19 cmH20] 16 cmH20   Intake/Output Summary (Last 24 hours) at 07/11/2019 0841 Last data filed at 07/11/2019 0800 Gross per 24 hour  Intake 1814.48 ml  Output 495 ml  Net 1319.48 ml   Filed Weights   07/08/19 0254 07/09/19 0351 07/11/19 0500  Weight: 71.9 kg 68.3 kg 83 kg   Examination:  General:  Elderly male on vent, critically ill. HEENT: [place]/AT, PERRL, no JVD Neuro: Sedated, not following commands this morning CV: RRR, no MRG PULM: Coarse bilateral breath sounds GI: soft, bs+, condom cath  Extremities: warm/dry, no LE edema, atrophy  Skin: grossly intact   Assessment & Plan:   Acute hypoxic respiratory failure with compromised airway: he continues to fail extubation most recently suffering cardiac arrest while in MRI 2/9.  Reasoning not well understood. Seems to be having central apneas that are not well explained.  - Full vent support - Wean as tolerated though mental status remains barrier - VAP bundle - Neuro now following again   Hemorrhagic shock: 2/2 RP bleed. Has received 3 units PRBC and 1 unit FFP. Heparin reversed.  - Serial CBC - Levo as needed to keep MAP > 54mmHg  Aspiration pneumonia - completed 7 days  of Unasyn 2/8. Culture now growing enterobacter pan sensitive - Continue CTX - Await susceptibilities - follow trach aspirate from 2/8  Acute metabolic encephalopathy: initially thought possibly related to seizure but since hospitalization, no clinical seizures observed and EEG negative thus far.  CT head from 2/11 with new left occipital infarct. Left occipital infarct - presumed 2/2 a.flutter / arrhythmia. - Neuro following. - continue keppra.  - PRN sedation for RASS goal 0 to -1.  - Stroke workup / management per neuro  New onset Atrial flutter 2/4-spontaneous converted to sinus rhythm Episodes of bradycardia.  CHADSVASc =4 HTN, HLD. - cardiology no longer following - prn IV hydralazine for SBP > 170 - heparin on hold with RP bleed  Dysphagia with difficulty placing OG/NG tube. Severe protein calorie malnutrition. - TFs  DM type 2 poorly controlled. - SSI and levemir  AKI, hopeful this will gradually improve given that UOP slowly picking up - Follow BMP - May need renal involvement if worsens.   Hypokalemia with hypomagnesemia - resolved - monitor with TF given malnutrition - goal K >4, Mag > 2  Shock liver - D/c atorvastatin, acetaminophen. - Trend LFT's.   Best practice:  Diet: tube feeds DVT prophylaxis: heparin gtt on hold GI prophylaxis: PPI  Glucose control: monitor Mobility: bed Code Status: DNR Disposition: ICU  CC time: 27 min   Montey Hora, [facility] 07/11/2019, 8:55 AM

## 2019-07-11 NOTE — Progress Notes (Signed)
   I spoke with a neurology colleague this morning.  He expressed concern about an episode of bradycardia that coincided with flattening of EEG signal.  He raised the concern of autonomic dysfunction that could be causing cerebral hypoperfusion.  I have carefully reviewed all rhythm data stored within the telemetry system.  The patient does have intermittent episodes of junctional rhythm but heart rates are not significantly slow for any period of time greater than 15 to 20 seconds.  I am unable to find an actionable rhythm disturbance that would explain the patient's current encephalopathic state.  I do note he had PEA arrest in hospital, and certainly if this type event occurred at home, it could be the basis for global CNS ischemia to cause encephalopathy.  This however is conjecture.  Recommend that we continue monitoring to see if there is actionable rhythm disturbance associated with hypotension and slowing of CNS activity.

## 2019-07-11 NOTE — Procedures (Addendum)
Patient Name:Seth Boyd TMB:[id number] Epilepsy Attending:Rohan Juenger Annabelle Harman Referring Physician/Provider:Dr. Georgiana Spinner Aroor Duration:2/11/20211703 to 2/12/20211703  Patient history:84 y.o.malewith PMH DM who presented to ED with c/o aphasia and right side weakness. CTH was negative for hemorrhage.Has been having waxing and waning mental status.EEG to evaluate for seizure  Level of alertness:comatose  AEDs during EEG study:LEV  Technical aspects: This EEG study was done with scalp electrodes positioned according to the 10-20 International system of electrode placement. Electrical activity was acquired at a sampling rate of 500Hz  and reviewed with a high frequency filter of 70Hz  and a low frequency filter of 1Hz . EEG data were recorded continuously and digitally stored.  DESCRIPTION: EEG showed continuous generalized3-5Hz  theta-delta slowing.  EEG was reactive to tactile and verbal stimulation.  Hyperventilation and photic stimulation were not performeddue to ams.   ABNORMALITY -Continuous slow, generalized  IMPRESSION: This study issuggestive of moderate to severe diffuse encephalopathy,non specific to etiology. No seizures or epileptiform discharges were seen during the study.     Peola Joynt 

## 2019-07-11 NOTE — Progress Notes (Signed)
OT Cancellation and Discharge Note  Patient Details Name: Seth Boyd MRN: [id number] DOB: 1934-10-15   Cancelled Treatment:    Reason Eval/Treat Not Completed: Medical issues which prohibited therapy.  Events of past couple of days noted.  Pt now intubated and sedated.  OT will sign off at this time as he is not appropriate for therapies at this time.  Please reorder if he becomes appropriate.  [name]., OTR/L Acute Rehabilitation Services Pager [phone] Office [phone]   [name] M 07/11/2019, 6:22 AM

## 2019-07-11 NOTE — Progress Notes (Signed)
Reason for consult: Encephalopathy  Subjective: Patient more awake.  Hemodynamically stable.  Has quasi rhythmic choreform movements in both upper extremities, usually brought upon by stimulation.  No EEG correlate.  ROS: negative except above  Examination  Vital signs in last 24 hours: Temp:  [97.2 F (36.2 C)-98.6 F (37 C)] 97.3 F (36.3 C) (02/12 2000) Pulse Rate:  [68-106] 106 (02/12 2031) Resp:  [11-28] 28 (02/12 2031) BP: (130-220)/(60-174) 167/63 (02/12 2031) SpO2:  [98 %-100 %] 100 % (02/12 2031) FiO2 (%):  [30 %] 30 % (02/12 2031) Weight:  [83 kg] 83 kg (02/12 0500)  General: lying in bed HEENT: Neck rigid+  CVS: pulse-normal rate and rhythm RS: breathing comfortably Extremities: normal   Neuro: MS: Eyes open spontaneously, does not track examiner.  Grimaces to pain.  Does not verbalize or follow any commands CN: pupils equal and reactive,  EOMI,  Motor: Withdraws antigravity in all 4 extremities, has choreiform movements in bilateral upper extremities Reflexes: plantars: flexor Coordination: Not tested Gait: not tested  Basic Metabolic Panel: Recent Labs  Lab 07/08/19 0404 07/08/19 1143 07/08/19 1520 07/08/19 1612 07/09/19 0630 07/09/19 0630 07/09/19 1241 07/09/19 1802 07/09/19 1820 07/09/19 1820 07/09/19 2255 07/10/19 0500 07/10/19 1833 07/11/19 0300 07/11/19 0802  NA 144   < > 143   < > 145   < > 143   < > 157*  --  145 145 146*  --  147*  K 2.6*   < > 3.1*   < > 4.5   < > 5.0   < > 4.7  --  4.4 5.0 4.5  --  3.9  CL 99  --  103   < > 106   < > 108  --  109  --   --  102 101  --  104  CO2 31  --  26   < > 24   < > 14*  --  28  --   --  24 27  --  28  GLUCOSE 221*  --  194*   < > 426*   < > 487*  --  182*  --   --  172* 164*  --  121*  BUN 15  --  15   < > 29*   < > 32*  --  31*  --   --  45* 63*  --  79*  CREATININE 0.58*  --  0.62   < > 1.16   < > 1.74*  --  1.65*  --   --  2.22* 2.67*  --  3.11*  CALCIUM 8.9  --  8.7*   < > 8.3*   < > 7.9*  --   7.2*   < >  --  7.7* 8.0*  --  7.8*  MG 1.5*  --   --   --  1.9  --   --   --  2.4  --   --  2.2  --  2.2  --   PHOS 2.6  --  3.4  --  3.1  --   --   --   --   --   --  4.7*  --  4.4  --    < > = values in this interval not displayed.    CBC: Recent Labs  Lab 07/10/19 1000 07/10/19 1500 07/10/19 2050 07/11/19 0300 07/11/19 0902  WBC 23.1* 18.5* 18.1* 19.4* 20.2*  HGB 8.1* 7.2* 8.3* 8.3* 8.7*  HCT  24.3* 21.5* 23.6* 24.7* 25.8*  MCV 89.7 89.6 87.1 88.5 88.7  PLT 174 127* 120* 143* 149*     Coagulation Studies: Recent Labs    07/09/19 1854 07/10/19 0500 07/10/19 1500 07/11/19 0300  LABPROT 28.6* 23.9* 21.7* 20.0*  INR 2.7* 2.1* 1.9* 1.7*    Imaging Reviewed: CT head: Shows small left PCA infarct, new from MRI done 2 days ago   ASSESSMENT AND PLAN  85 year old male with diabetes mellitus presented with aphasia and flaccid right-sided weakness on 1/31.Wassubsequently intubated due to lethargic state. MRI brain was negative for acute stroke. Initial EEG as well as long-term EEG negative for epileptiform discharges.  Patient was extubated on 2/2 and was following simple commands. Patient was continued on Keppra 500mg  twice daily. Neurology signed off and plan to involve as outpatient. Reintuabted   Since then,patient would have unexplained episodes of hypoxia as well as somnolence associated with bradycardia.  Re-intubated on 2/4.  Also developed new onset atrial flutter with episodes of bradycardia. 2/8, patient was extubated.  2/9 : Continued  havingepisodes of apnea, somnolence and bradycardia for which neurology was consulted. ABG not suggestive of hypercarbia.  Patient was being taken for repeat MRI brain ( which showed no acute findings).Suddenly hadPEAarrest and was coded. ROSC in 3 min. Patient intubated, but following commands. Long-term EEG placed to look for subclinical seizures.  Overnight patient went into shock, initially unclear as to reason-  later found to be hemorrhagic.  2/10 -acute drop in Hb, heparin drip stopped patient received 3 PRBCs and Protamine. LTM EEG shows no seizures, however captured 1 episode :  At 1535 during which EEG initially showed continuous generalized slowing followed by generalized suppression.  Concomitant vital signs showed severe hypertosion and bradycardia. On review of patient's vitals in chart, it appears that his BP dropped to 34/26, HR 33.  2/11: CT abdomen confirmed retroperitoneal hematoma and CT head shows moderate L subacute PCA infarction new from MRI Brain on 2/9  2/12: Patient opens eyes, withdraws in all 4 extremities.  Has bilateral upper extremity tremors/chorea -with no EEG correlate.   Acute metabolic encephalopathy Suspected seizure on presentation Unexplained episodes of apnea PEA arrest Shock  Acute kidney injury Elevated liver enzymes due to shock liver Metabolic acidosis Gram-negative pneumonia-Enterobacter in respiratory culture Acute/subacute left PCA stroke B/L UE tremors/chorea- ? Sequelae of hypoxia   Plan:  #D/C LTM EEG- no seizures noted.  Focal seizures are not cause for patient's autonomic disturbance as he had 1 event of significant bradycardia, hypotension without preceding epileptiform discharges. ?  Whether Keppra needs to be continued.  #Discuss case with cardiology- Reviewed telemetry, do not feel patient's heart rate rhythm is responsible for sudden drop in blood pressure/apnea.  #Acute left PCA infarct-likely embolic, may have occurred following CPR vs atrial fibrillation/flutter and coagulopathy, currently not a candidate for [facility] due to hemorrhage. #Unexplained apnea: We will defer to CCM, no secondary CNS cause for central sleep apnea  #Persistent encephalopathy: Unfortunately, because the patient's multiple metabolic issues, etiology is confounded.  LP considered-however contraindicated in the setting of coagulopathy, also low yield- (low suspicion of CNS  infection as patient had no neck rigidity on presentation, afebrile and was clinically improving and patient has also been on antibiotics.  Checking for inflammatory conditions will be confounded since he had acute stroke).    CRITICAL CARE Performed by: Lanice Schwab Zoriah Pulice   Total critical care time: 35 minutes  Critical care time was exclusive of separately billable procedures and  treating other patients.  Critical care was necessary to treat or prevent imminent or life-threatening deterioration.  Critical care was time spent personally by me on the following activities: development of treatment plan with patient and/or surrogate as well as nursing, discussions with consultants, evaluation of patient's response to treatment, examination of patient, obtaining history from patient or surrogate, ordering and performing treatments and interventions, ordering and review of laboratory studies, ordering and review of radiographic studies, pulse oximetry and re-evaluation of patient's condition.    Georgiana Spinner Antionne Enrique [facility] Pager Number [phone] For questions after 7pm please refer to AMION to reach the Neurologist on call

## 2019-07-12 ENCOUNTER — Inpatient Hospital Stay (HOSPITAL_COMMUNITY): Payer: Medicare HMO

## 2019-07-12 DIAGNOSIS — E43 Unspecified severe protein-calorie malnutrition: Secondary | ICD-10-CM

## 2019-07-12 DIAGNOSIS — G459 Transient cerebral ischemic attack, unspecified: Secondary | ICD-10-CM

## 2019-07-12 LAB — GLUCOSE, CAPILLARY
Glucose-Capillary: 161 mg/dL — ABNORMAL HIGH (ref 70–99)
Glucose-Capillary: 174 mg/dL — ABNORMAL HIGH (ref 70–99)
Glucose-Capillary: 239 mg/dL — ABNORMAL HIGH (ref 70–99)
Glucose-Capillary: 254 mg/dL — ABNORMAL HIGH (ref 70–99)
Glucose-Capillary: 245 mg/dL — ABNORMAL HIGH (ref 70–99)

## 2019-07-12 LAB — PHOSPHORUS: Phosphorus: 5.3 mg/dL — ABNORMAL HIGH (ref 2.5–4.6)

## 2019-07-12 LAB — HEPATIC FUNCTION PANEL
ALT: 1674 U/L — ABNORMAL HIGH (ref 0–44)
AST: 858 U/L — ABNORMAL HIGH (ref 15–41)
Albumin: 2.2 g/dL — ABNORMAL LOW (ref 3.5–5.0)
Alkaline Phosphatase: 115 U/L (ref 38–126)
Bilirubin, Direct: 0.4 mg/dL — ABNORMAL HIGH (ref 0.0–0.2)
Indirect Bilirubin: 0.9 mg/dL (ref 0.3–0.9)
Total Bilirubin: 1.3 mg/dL — ABNORMAL HIGH (ref 0.3–1.2)
Total Protein: 5.1 g/dL — ABNORMAL LOW (ref 6.5–8.1)

## 2019-07-12 LAB — BASIC METABOLIC PANEL
Anion gap: 15 (ref 5–15)
BUN: 88 mg/dL — ABNORMAL HIGH (ref 8–23)
CO2: 27 mmol/L (ref 22–32)
Calcium: 7.8 mg/dL — ABNORMAL LOW (ref 8.9–10.3)
Chloride: 104 mmol/L (ref 98–111)
Creatinine, Ser: 2.84 mg/dL — ABNORMAL HIGH (ref 0.61–1.24)
GFR calc Af Amer: 23 mL/min — ABNORMAL LOW (ref >60)
GFR calc non Af Amer: 20 mL/min — ABNORMAL LOW (ref >60)
Glucose, Bld: 165 mg/dL — ABNORMAL HIGH (ref 70–99)
Potassium: 3.1 mmol/L — ABNORMAL LOW (ref 3.5–5.1)
Sodium: 146 mmol/L — ABNORMAL HIGH (ref 135–145)

## 2019-07-12 LAB — CBC
HCT: 29 % — ABNORMAL LOW (ref 39.0–52.0)
Hemoglobin: 9.6 g/dL — ABNORMAL LOW (ref 13.0–17.0)
MCH: 30 pg (ref 26.0–34.0)
MCHC: 33.1 g/dL (ref 30.0–36.0)
MCV: 90.6 fL (ref 80.0–100.0)
Platelets: 207 K/uL (ref 150–400)
RBC: 3.2 MIL/uL — ABNORMAL LOW (ref 4.22–5.81)
RDW: 15.1 % (ref 11.5–15.5)
WBC: 18.7 K/uL — ABNORMAL HIGH (ref 4.0–10.5)
nRBC: 0.4 % — ABNORMAL HIGH (ref 0.0–0.2)

## 2019-07-12 LAB — MAGNESIUM: Magnesium: 2.3 mg/dL (ref 1.7–2.4)

## 2019-07-12 IMAGING — DX DG CHEST 1V PORT
1 series · 1 of 1 positions shown · non-contrast
Comparison: [DATE]

CLINICAL DATA: Respiratory failure.

EXAM:
PORTABLE CHEST 1 VIEW

[chest ap]
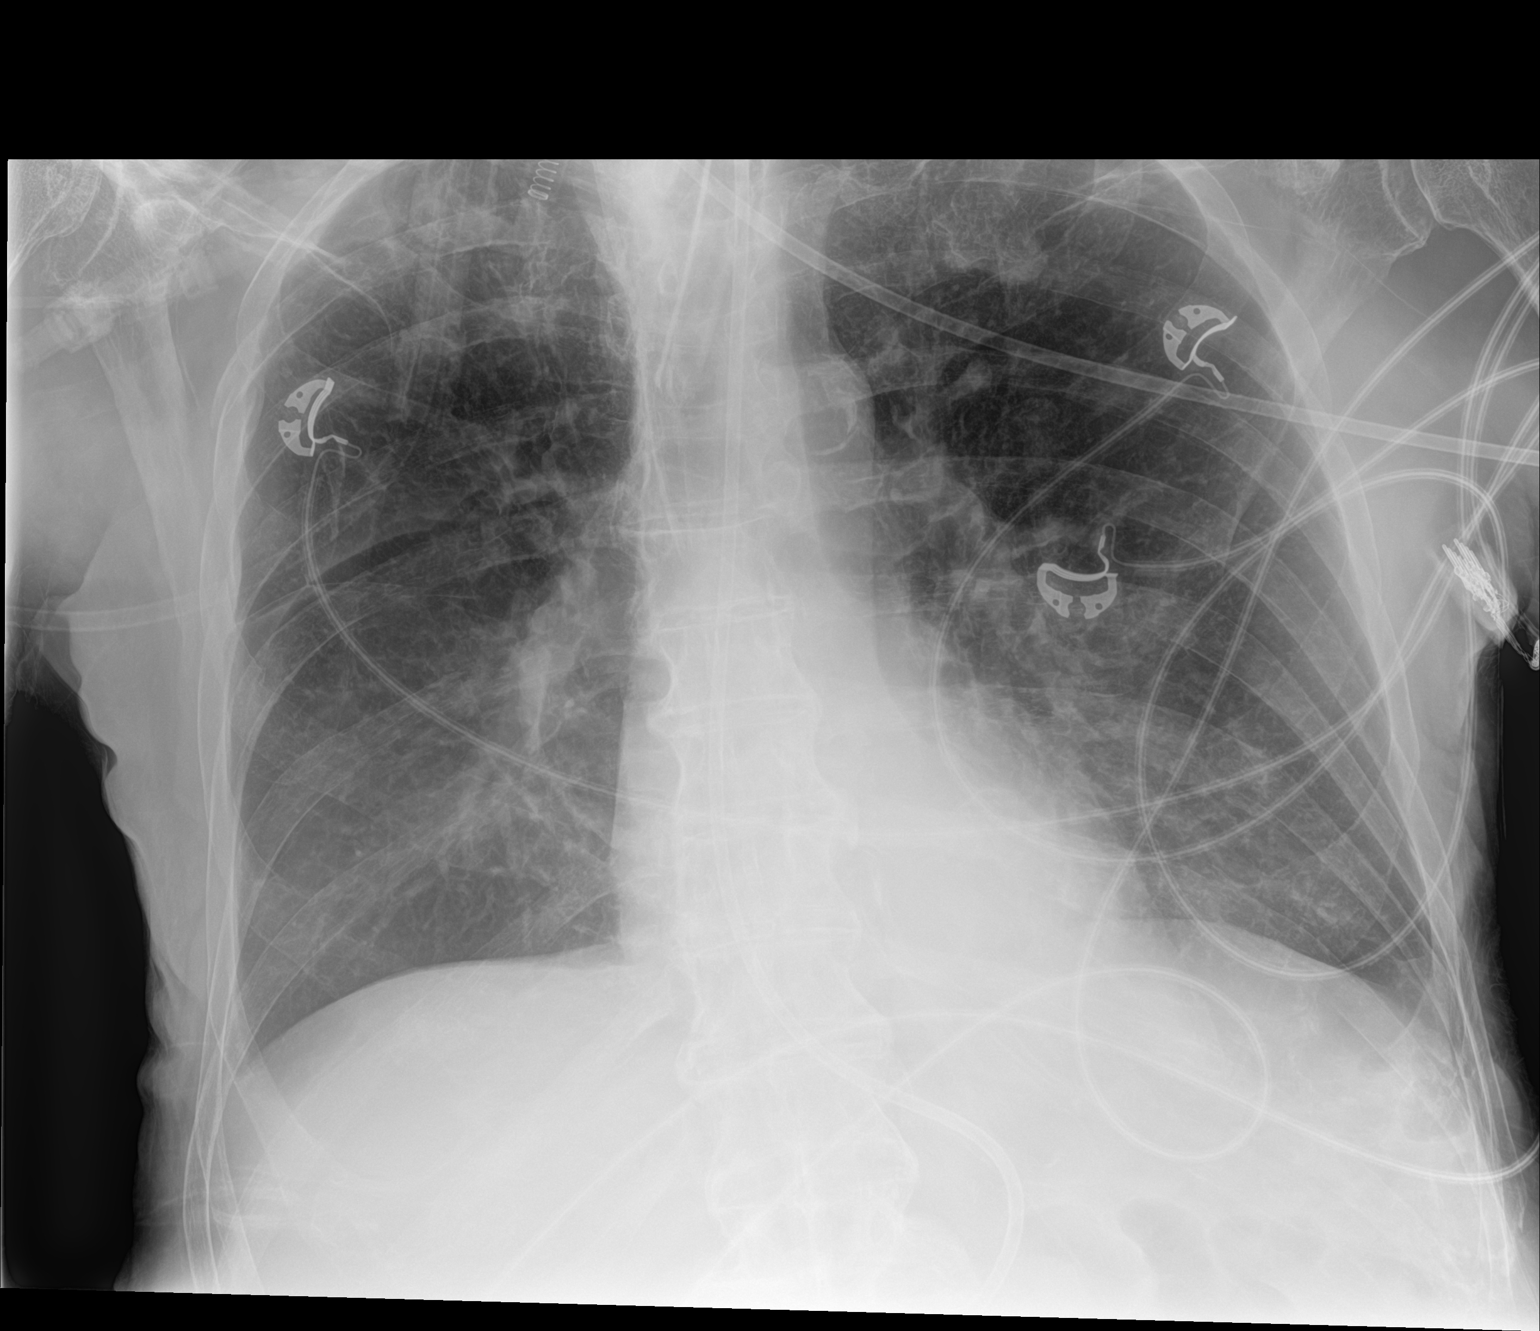

[1 of 1 positions shown; findings below may reference images not displayed]

FINDINGS: Endotracheal tube terminates 3.5 cm above the carina. Feeding tube
courses into the abdomen with tip not imaged. Right jugular catheter
terminates over the SVC. The cardiomediastinal silhouette is
unchanged with normal heart size. There is new retrocardiac opacity
in the left lower lobe. Minimal hazy right infrahilar opacity is
unchanged. No large pleural effusion or pneumothorax is identified.
IMPRESSION: New left lower lobe atelectasis versus pneumonia.

## 2019-07-12 MED ORDER — POTASSIUM CHLORIDE 20 MEQ/15ML (10%) PO SOLN
40.0000 meq | ORAL | Status: AC
  Administered 2019-07-12 (×2): 40 meq
  Filled 2019-07-12 (×2): qty 30

## 2019-07-12 MED ORDER — HYDRALAZINE HCL 20 MG/ML IJ SOLN
10.0000 mg | INTRAMUSCULAR | Status: DC | PRN
  Administered 2019-07-12 – 2019-07-14 (×6): 10 mg via INTRAVENOUS
  Filled 2019-07-12 (×5): qty 1

## 2019-07-12 NOTE — Progress Notes (Signed)
eLink Physician-Brief Progress Note Patient Name: Seth Boyd DOB: 11-26-1934 MRN: [id number]   Date of Service  07/12/2019  HPI/Events of Note  Hypertension - BP = 179/69.   eICU Interventions  Will order: Increasse frequency of Hydralazine 10 mg IV to Q 4 hours PRN SBP > 170 or DBP > 100.     Intervention Category Major Interventions: Hypertension - evaluation and management  Janecia Palau Dennard Nip 07/12/2019, 12:33 AM

## 2019-07-12 NOTE — Progress Notes (Signed)
NAME:  Seth Boyd, MRN:  [id number], DOB:  Sep 27, 1934, LOS: 13 ADMISSION DATE:  06/12/2019, CONSULTATION DATE:  06/08/2019 REFERRING MD:  Ophelia Charter, CHIEF COMPLAINT:  Encephalopathy   Brief History   85 y.o. male, goes by Seth Boyd, with prior hx HTN/ DM presenting after being found on floor with acute encephalopathy.  Presented as code stroke.  Noted to have initial left gaze.  LSW 1/30 ~4 pm.  Rule out for stroke with negative CTH/ CTA.  Concern for seizure +/- toxic/ metabolic encephalopathy.    Past Medical History  HTN, DM  Significant Hospital Events   1/31 Admit 2/02 extubated 2/03 difficulty with agitation and airway protection, weak cough 2/04 reintubated early am, a flutter New onset a fib/flutter - on heparin 2/5 issues with foley leaking  Started back on propofol  2/6 stable Apneic with PSV  2/8 Extubated; PEA arrest in MRI 3 mins duration. Re-intuabted 2/9 shock developed, hemorrhagic. Suspect RP bleed    Consults:  Neurology Cardiology  Procedures:  1/31 ETT >> 2/02 2/04 ETT >> 2/8 2/8 ETT >>  Significant Diagnostic Tests:  1/31 CT head/neck >> 30% ICA stenosis on the right, 20% on the left, 30-50% narrowing in both carotid siphon regions but no correctable proximal stenosis. 1/31 EEG >> moderate diffuse encephalopathy, no epileptiform activity 2/01 brain MRI >> Moderate cerebral atrophy with chronic microvascular ischemic disease, Soft tissue edema within the right occipital/suboccipital scalp. 2/01 LTM  >> moderate diffuse encephalopathy 2/05 Echo >> LVEF 65-70%. Otherwise, unimpressive cxr 07/04/19 Bibasilar infiltrates consistent with pneumonia or aspiration, RIGHT greater than LEFT, slightly increased on RIGHT since prior study MRI 2/8 > No acute intracranial abnormality. Marked parenchymal volume loss. Moderate chronic small vessel ischemia. EEG 2/8 >>> no seizure identified EEG 2/11 > moderate diffuse encephalopathy CT head 2/11 > acute to early subacute  infarct of the left occipital lobe CT A / P 2/11 > large left RP hematoma, trace ascites, small b/l effusions with compressive atelectasis   Micro Data:  1/31 SARS 2/ flu A/B >> neg 1/31 MRSA PCR >> negative 2/8 resp >> Few enterobacter  Antimicrobials:  Unasyn 2/02 >> 2/8 Zosyn 2/9 > 2/10 Vanco 2/9 > 2/10  Ceftriaxone 2/10 >  Interim history/subjective:  BP improved. CTH with new left occipital infarct. A little bit responsive this morning  Objective   Blood pressure (!) 164/59, pulse 93, temperature 99 F (37.2 C), resp. rate 15, height 5\' 10"  (1.778 m), weight 78.7 kg, SpO2 99 %.    Vent Mode: CPAP;PSV FiO2 (%):  [30 %] 30 % Set Rate:  [24 bmp] 24 bmp Vt Set:  [510 mL] 510 mL PEEP:  [5 cmH20] 5 cmH20 Pressure Support:  [8 cmH20-10 cmH20] 8 cmH20 Plateau Pressure:  [16 cmH20-17 cmH20] 17 cmH20   Intake/Output Summary (Last 24 hours) at 07/12/2019 1022 Last data filed at 07/12/2019 0600 Gross per 24 hour  Intake 442.66 ml  Output 4550 ml  Net -4107.34 ml   Filed Weights   07/09/19 0351 07/11/19 0500 07/12/19 0500  Weight: 68.3 kg 83 kg 78.7 kg   Examination:  General: Elderly gentleman, on vent HEENT: Moist oral mucosa, pupils reacting Neuro: Does attempt to open eyes with name calling CV: S1-S2 appreciated, no murmur PULM: Coarse breath sounds, reduced at the bases GI: soft, bs+, condom cath  Extremities: Warm/dry Skin: grossly intact   Assessment & Plan:  Acute hypoxic respiratory failure with compromised airway Cardiac arrest an MRI on 2/9 Concern for central apneas -  Continue full vent support -Wean as tolerated -VAP bundle in place -Neurology following  -Persistent encephalopathy  Hemorrhagic shock secondary to retroperitoneal bleed -Trend CBC  Aspiration pneumonia -Completed antibiotics  Acute metabolic encephalopathy -No seizures on EEG so far -New left occipital infarct -Infarct presumed secondary to a flutter/arrhythmia -Neurology  following, on Keppra -Stroke work-up neurology  New onset atrial flutter -Occasional episodes of bradycardia -Not on anticoagulation secondary to recent retroperitoneal bleed  Dysphagia -Continue tube feeds  Diabetes, type II -SSI and Levemir  Acute kidney injury -Trend electrolytes   Dysphagia with difficulty placing OG/NG tube. Severe protein calorie malnutrition. - TFs  Electrolyte derangement -Being repleted  Shock liver -Trend LFTs   Best practice:  Diet: tube feeds DVT prophylaxis: heparin gtt on hold GI prophylaxis: PPI  Glucose control: monitor Mobility: bed Code Status: DNR Disposition: ICU  The patient is critically ill with multiple organ systems failure and requires high complexity decision making for assessment and support, frequent evaluation and titration of therapies, application of advanced monitoring technologies and extensive interpretation of multiple databases. Critical Care Time devoted to patient care services described in this note independent of APP/resident time (if applicable)  is 30 minutes.   Sherrilyn Rist MD [facility] Personal pager: [phone] If unanswered, please page CCM On-call: [phone]

## 2019-07-12 NOTE — Progress Notes (Signed)
Subjective: No significant events Exam: No significant change in exam, patient opens eyes spontaneously.  Moves all 4 extremities to noxious stimulus, continues to have choreiform movements in both upper extremities.  Recommend MRI brain and MRA head when patient is stable.  Continue to hold anticoagulation due to hemorrhagic shock.

## 2019-07-13 LAB — TYPE AND SCREEN
ABO/RH(D): A NEG
Antibody Screen: NEGATIVE
Unit division: 0
Unit division: 0
Unit division: 0
Unit division: 0
Unit division: 0

## 2019-07-13 LAB — BPAM RBC
Blood Product Expiration Date: 202102202359
Blood Product Expiration Date: 202102222359
Blood Product Expiration Date: 202102222359
Blood Product Expiration Date: 202103092359
Blood Product Expiration Date: 202103112359
ISSUE DATE / TIME: 202102101832
ISSUE DATE / TIME: 202102101832
ISSUE DATE / TIME: 202102110543
ISSUE DATE / TIME: 202102111742
Unit Type and Rh: 5100
Unit Type and Rh: 5100
Unit Type and Rh: 600
Unit Type and Rh: 600
Unit Type and Rh: 600

## 2019-07-13 LAB — GLUCOSE, CAPILLARY
Glucose-Capillary: 156 mg/dL — ABNORMAL HIGH (ref 70–99)
Glucose-Capillary: 157 mg/dL — ABNORMAL HIGH (ref 70–99)
Glucose-Capillary: 175 mg/dL — ABNORMAL HIGH (ref 70–99)
Glucose-Capillary: 179 mg/dL — ABNORMAL HIGH (ref 70–99)
Glucose-Capillary: 225 mg/dL — ABNORMAL HIGH (ref 70–99)
Glucose-Capillary: 239 mg/dL — ABNORMAL HIGH (ref 70–99)
Glucose-Capillary: 269 mg/dL — ABNORMAL HIGH (ref 70–99)

## 2019-07-13 MED ORDER — INSULIN ASPART 100 UNIT/ML ~~LOC~~ SOLN
0.0000 [IU] | SUBCUTANEOUS | Status: DC
  Administered 2019-07-13: 2 [IU] via SUBCUTANEOUS
  Administered 2019-07-13: 5 [IU] via SUBCUTANEOUS
  Administered 2019-07-13: 2 [IU] via SUBCUTANEOUS
  Administered 2019-07-13 (×2): 3 [IU] via SUBCUTANEOUS
  Administered 2019-07-13 (×2): 2 [IU] via SUBCUTANEOUS
  Administered 2019-07-14 (×2): 1 [IU] via SUBCUTANEOUS
  Administered 2019-07-14 – 2019-07-15 (×5): 2 [IU] via SUBCUTANEOUS
  Administered 2019-07-15 (×2): 1 [IU] via SUBCUTANEOUS
  Administered 2019-07-15 – 2019-07-16 (×3): 2 [IU] via SUBCUTANEOUS
  Administered 2019-07-16 (×3): 1 [IU] via SUBCUTANEOUS
  Administered 2019-07-17 (×2): 2 [IU] via SUBCUTANEOUS
  Administered 2019-07-17: 1 [IU] via SUBCUTANEOUS
  Administered 2019-07-17: 2 [IU] via SUBCUTANEOUS
  Administered 2019-07-17 (×2): 1 [IU] via SUBCUTANEOUS
  Administered 2019-07-18 (×2): 3 [IU] via SUBCUTANEOUS
  Administered 2019-07-18: 2 [IU] via SUBCUTANEOUS
  Administered 2019-07-18: 3 [IU] via SUBCUTANEOUS
  Administered 2019-07-18: 2 [IU] via SUBCUTANEOUS
  Administered 2019-07-18 – 2019-07-19 (×6): 3 [IU] via SUBCUTANEOUS
  Administered 2019-07-19 (×2): 2 [IU] via SUBCUTANEOUS
  Administered 2019-07-20 (×3): 3 [IU] via SUBCUTANEOUS
  Administered 2019-07-20: 2 [IU] via SUBCUTANEOUS
  Administered 2019-07-20: 3 [IU] via SUBCUTANEOUS
  Administered 2019-07-20: 5 [IU] via SUBCUTANEOUS
  Administered 2019-07-21: 2 [IU] via SUBCUTANEOUS
  Administered 2019-07-21: 1 [IU] via SUBCUTANEOUS
  Administered 2019-07-21: 3 [IU] via SUBCUTANEOUS
  Administered 2019-07-21: 1 [IU] via SUBCUTANEOUS
  Administered 2019-07-21: 2 [IU] via SUBCUTANEOUS
  Administered 2019-07-22: 1 [IU] via SUBCUTANEOUS
  Administered 2019-07-22: 2 [IU] via SUBCUTANEOUS

## 2019-07-13 MED ORDER — INSULIN ASPART 100 UNIT/ML ~~LOC~~ SOLN
4.0000 [IU] | SUBCUTANEOUS | Status: DC
  Administered 2019-07-13 – 2019-07-17 (×26): 4 [IU] via SUBCUTANEOUS

## 2019-07-13 NOTE — Progress Notes (Signed)
eLink Physician-Brief Progress Note Patient Name: Seth Boyd DOB: 1934/10/07 MRN: [id number]   Date of Service  07/13/2019  HPI/Events of Note  Hyperglycemia - Blood glucose = 254 --> 245 --> 269. No insulin coverage.   eICU Interventions  Will order: 1. Q 4 hour sensitive Novolog SSI + Novolog 4 units Q 4 hour tube feeding coverage.      Intervention Category Major Interventions: Hyperglycemia - active titration of insulin therapy  [name] 07/13/2019, 12:52 AM

## 2019-07-13 NOTE — Progress Notes (Signed)
NAME:  Seth Boyd, MRN:  [id number], DOB:  1935-03-23, LOS: 72 ADMISSION DATE:  06-Jul-2019, CONSULTATION DATE:  2019/07/06 REFERRING MD:  Lorin Mercy, CHIEF COMPLAINT:  Encephalopathy   Brief History   85 y.o. male, goes by Seth Boyd, with prior hx HTN/ DM presenting after being found on floor with acute encephalopathy.  Presented as code stroke.  Noted to have initial left gaze.  LSW 1/30 ~4 pm.  Rule out for stroke with negative CTH/ CTA.  Concern for seizure +/- toxic/ metabolic encephalopathy.    Past Medical History  HTN, DM  Significant Hospital Events   1/31 Admit 2/02 extubated 2/03 difficulty with agitation and airway protection, weak cough 2/04 reintubated early am, a flutter New onset a fib/flutter - on heparin 2/5 issues with foley leaking  Started back on propofol  2/6 stable Apneic with PSV  2/8 Extubated; PEA arrest in MRI 3 mins duration. Re-intuabted 2/9 shock developed, hemorrhagic. Suspect RP bleed   Consults:  Neurology Cardiology  Procedures:  1/31 ETT >> 2/02 2/04 ETT >> 2/8 2/8 ETT >>  Significant Diagnostic Tests:  1/31 CT head/neck >> 30% ICA stenosis on the right, 20% on the left, 30-50% narrowing in both carotid siphon regions but no correctable proximal stenosis. 1/31 EEG >> moderate diffuse encephalopathy, no epileptiform activity 2/01 brain MRI >> Moderate cerebral atrophy with chronic microvascular ischemic disease, Soft tissue edema within the right occipital/suboccipital scalp. 2/01 LTM  >> moderate diffuse encephalopathy 2/05 Echo >> LVEF 65-70%. Otherwise, unimpressive cxr 07/04/19 Bibasilar infiltrates consistent with pneumonia or aspiration, RIGHT greater than LEFT, slightly increased on RIGHT since prior study MRI 2/8 > No acute intracranial abnormality. Marked parenchymal volume loss. Moderate chronic small vessel ischemia. EEG 2/8 >>> no seizure identified EEG 2/11 > moderate diffuse encephalopathy CT head 2/11 > acute to early subacute  infarct of the left occipital lobe CT A / P 2/11 > large left RP hematoma, trace ascites, small b/l effusions with compressive atelectasis   Micro Data:  1/31 SARS 2/ flu A/B >> neg 1/31 MRSA PCR >> negative 2/8 resp >> Few enterobacter  Antimicrobials:  Unasyn 2/02 >> 2/8 Zosyn 2/9 > 2/10 Vanco 2/9 > 2/10  Ceftriaxone 2/10 >  Interim history/subjective:  CTH with new left occipital infarct. Mental status unchanged compared to previous Spontaneously opens eyes Withdraws to pain, not following commands  Objective   Blood pressure (!) 159/70, pulse 85, temperature 98.2 F (36.8 C), resp. rate (!) 24, height 5\' 10"  (1.778 m), weight 78.7 kg, SpO2 95 %.    Vent Mode: PRVC FiO2 (%):  [30 %] 30 % Set Rate:  [24 bmp] 24 bmp Vt Set:  [510 mL] 510 mL PEEP:  [5 cmH20] 5 cmH20 Pressure Support:  [8 cmH20] 8 cmH20 Plateau Pressure:  [15 cmH20-16 cmH20] 16 cmH20   Intake/Output Summary (Last 24 hours) at 07/13/2019 1059 Last data filed at 07/13/2019 0600 Gross per 24 hour  Intake 540 ml  Output 1700 ml  Net -1160 ml   Filed Weights   07/09/19 0351 07/11/19 0500 07/12/19 0500  Weight: 68.3 kg 83 kg 78.7 kg   Examination:  General: Elderly gentleman, on vent HEENT: Moist oral mucosa pupils reacting Neuro: Spontaneously opens eyes, not following commands CV: S1-S2 appreciated PULM: Coarse breath sounds, reduced at the bases GI: soft, bs+, condom cath   Assessment & Plan:  Acute hypoxic respiratory failure with compromised airway Cardiac arrest Concern for central apneas -Continue full vent support -Continue weaning as tolerated -  VAP bundle in place -Persistent encephalopathy  Hemorrhagic shock secondary to retroperitoneal bleed -Trend CBC  Aspiration pneumonia -Completed antibiotics  Acute metabolic encephalopathy -No seizures on EEG -New left occipital infarct-stroke work-up per neurology -On Keppra  New onset atrial flutter -Occasional episodes of  bradycardia -Not on anticoagulation secondary to recent retroperitoneal bleed  Dysphagia Protein calorie malnutrition -Continue tube feeds  Diabetes, type II -SSI and Levemir  Acute kidney injury -Trend electrolytes  Shock liver -Trend LFTs   Best practice:  Diet: tube feeds DVT prophylaxis: heparin gtt on hold GI prophylaxis: PPI  Glucose control: monitor Mobility: bed Code Status: DNR Disposition: ICU  The patient is critically ill with multiple organ systems failure and requires high complexity decision making for assessment and support, frequent evaluation and titration of therapies, application of advanced monitoring technologies and extensive interpretation of multiple databases. Critical Care Time devoted to patient care services described in this note independent of APP/resident time (if applicable)  is 32 minutes.   Virl Diamond MD [facility] Personal pager: [phone] If unanswered, please page CCM On-call: [phone]

## 2019-07-14 ENCOUNTER — Inpatient Hospital Stay (HOSPITAL_COMMUNITY): Payer: Medicare HMO

## 2019-07-14 LAB — CBC
HCT: 31.6 % — ABNORMAL LOW (ref 39.0–52.0)
Hemoglobin: 9.6 g/dL — ABNORMAL LOW (ref 13.0–17.0)
MCH: 29.2 pg (ref 26.0–34.0)
MCHC: 30.4 g/dL (ref 30.0–36.0)
MCV: 96 fL (ref 80.0–100.0)
Platelets: 273 10*3/uL (ref 150–400)
RBC: 3.29 MIL/uL — ABNORMAL LOW (ref 4.22–5.81)
RDW: 15.5 % (ref 11.5–15.5)
WBC: 10.7 10*3/uL — ABNORMAL HIGH (ref 4.0–10.5)
nRBC: 0.6 % — ABNORMAL HIGH (ref 0.0–0.2)

## 2019-07-14 LAB — COMPREHENSIVE METABOLIC PANEL
ALT: 773 U/L — ABNORMAL HIGH (ref 0–44)
AST: 152 U/L — ABNORMAL HIGH (ref 15–41)
Albumin: 2.1 g/dL — ABNORMAL LOW (ref 3.5–5.0)
Alkaline Phosphatase: 117 U/L (ref 38–126)
Anion gap: 16 — ABNORMAL HIGH (ref 5–15)
BUN: 70 mg/dL — ABNORMAL HIGH (ref 8–23)
CO2: 29 mmol/L (ref 22–32)
Calcium: 8 mg/dL — ABNORMAL LOW (ref 8.9–10.3)
Chloride: 113 mmol/L — ABNORMAL HIGH (ref 98–111)
Creatinine, Ser: 1.44 mg/dL — ABNORMAL HIGH (ref 0.61–1.24)
GFR calc Af Amer: 52 mL/min — ABNORMAL LOW (ref >60)
GFR calc non Af Amer: 45 mL/min — ABNORMAL LOW (ref >60)
Glucose, Bld: 160 mg/dL — ABNORMAL HIGH (ref 70–99)
Potassium: 2.5 mmol/L — CL (ref 3.5–5.1)
Sodium: 158 mmol/L — ABNORMAL HIGH (ref 135–145)
Total Bilirubin: 1.2 mg/dL (ref 0.3–1.2)
Total Protein: 5.1 g/dL — ABNORMAL LOW (ref 6.5–8.1)

## 2019-07-14 LAB — GLUCOSE, CAPILLARY
Glucose-Capillary: 118 mg/dL — ABNORMAL HIGH (ref 70–99)
Glucose-Capillary: 139 mg/dL — ABNORMAL HIGH (ref 70–99)
Glucose-Capillary: 150 mg/dL — ABNORMAL HIGH (ref 70–99)
Glucose-Capillary: 162 mg/dL — ABNORMAL HIGH (ref 70–99)
Glucose-Capillary: 167 mg/dL — ABNORMAL HIGH (ref 70–99)
Glucose-Capillary: 179 mg/dL — ABNORMAL HIGH (ref 70–99)

## 2019-07-14 IMAGING — MR MR HEAD W/O CM
12 of 13 series · 44 of 48 positions shown · non-contrast
Comparison: Recent head CT [DATE], brain MRI [DATE], and
earlier.

CLINICAL DATA: 84-year-old male with acute to infarct of the left
occipital lobe recently seen on head CT [DATE], after MAGNEA BERGLIND
arrest on [DATE] following a brain MRI.

EXAM:
MRI HEAD WITHOUT CONTRAST
TECHNIQUE: Multiplanar, multiecho pulse sequences of the brain and surrounding
structures were obtained without intravenous contrast.

[Series 5: DWI · axial · 3.0mm · 0.88mm/px · z∈[-44,+111]mm · 8 of 108 slices shown (1 of 4)]
[im 1/108]
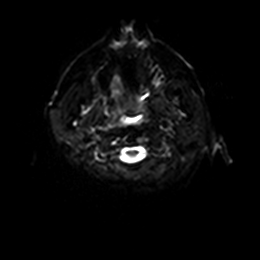
[im 16/108]
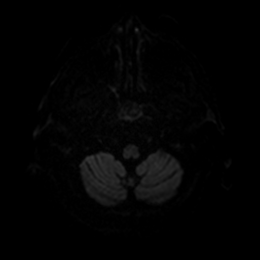
[im 31/108]
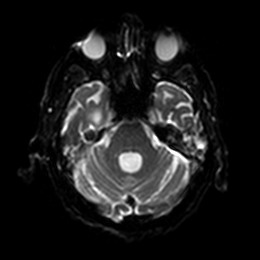
[im 46/108]
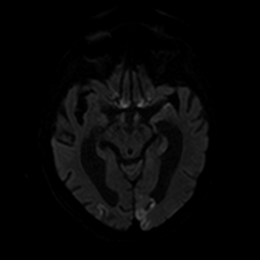
[im 62/108]
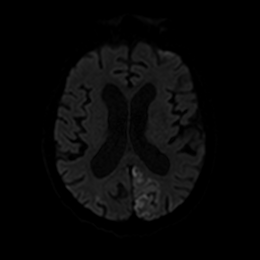
[im 77/108]
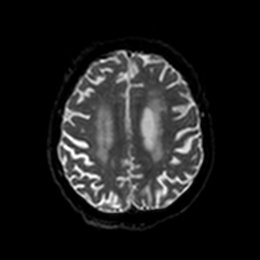
[im 92/108]
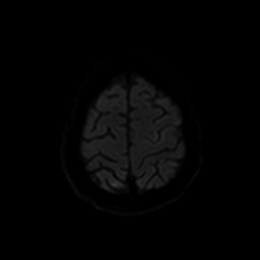
[im 108/108]
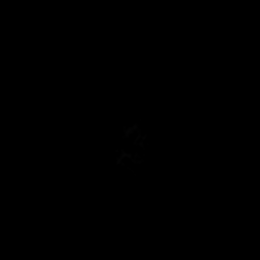

[Series 6: DWI · axial · 3.0mm · 0.88mm/px · z∈[-44,+111]mm · 4 of 54 slices shown (2 of 4)]
[im 1/54]
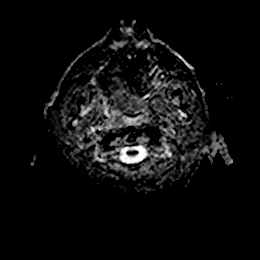
[im 18/54]
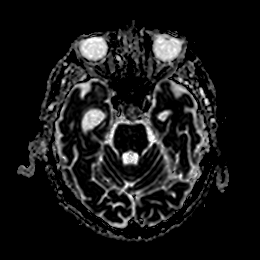
[im 36/54]
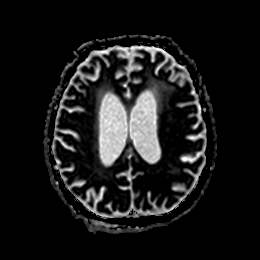
[im 54/54]
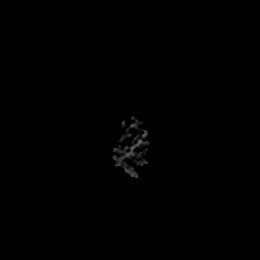

[Series 7: DWI · coronal · 4.0mm · 0.88mm/px · 5 of 76 slices shown (3 of 4)]
[im 1/76]
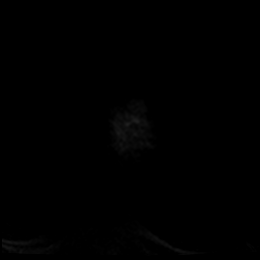
[im 19/76]
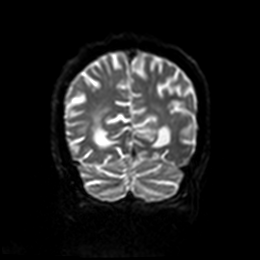
[im 38/76]
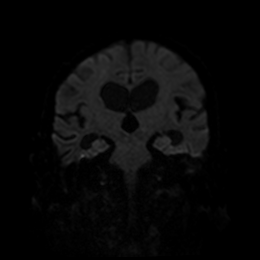
[im 57/76]
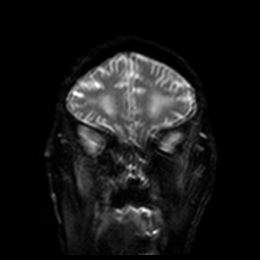
[im 76/76]
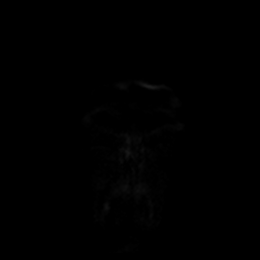

[Series 8: DWI · coronal · 4.0mm · 0.88mm/px · 3 of 38 slices shown (4 of 4)]
[im 1/38]
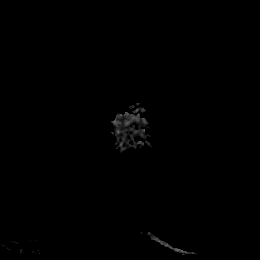
[im 19/38]
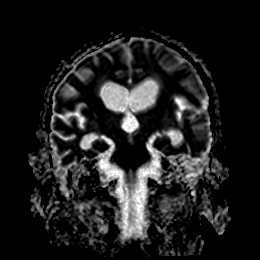
[im 38/38]
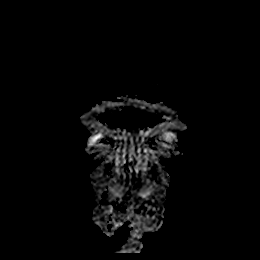

[Series 9: T1 · sagittal · 5.0mm · 0.75mm/px · 2 of 23 slices shown]
[im 1/23]
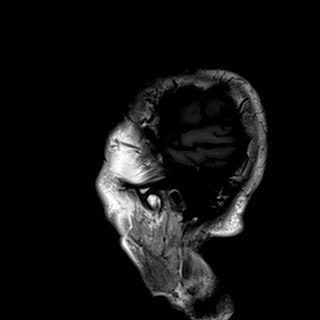
[im 23/23]
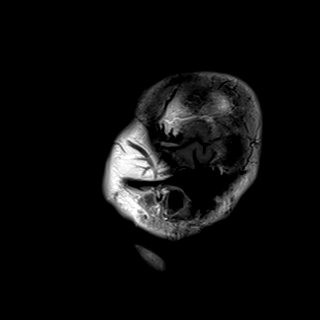

[Series 10: T2 · axial · 5.0mm · 0.72mm/px · z∈[-40,+100]mm · 2 of 24 slices shown (1 of 2)]
[im 1/24]
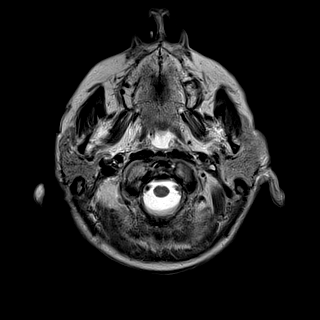
[im 24/24]
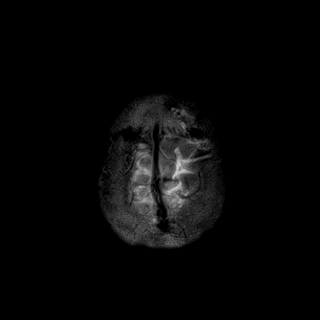

[Series 11: FLAIR · axial · 5.0mm · 0.45mm/px · z∈[-38,+102]mm · 2 of 25 slices shown]
[im 1/25]
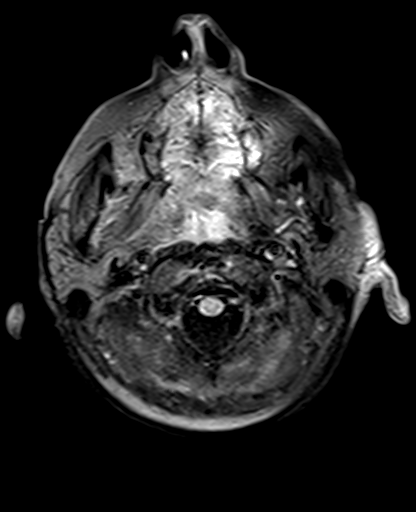
[im 25/25]
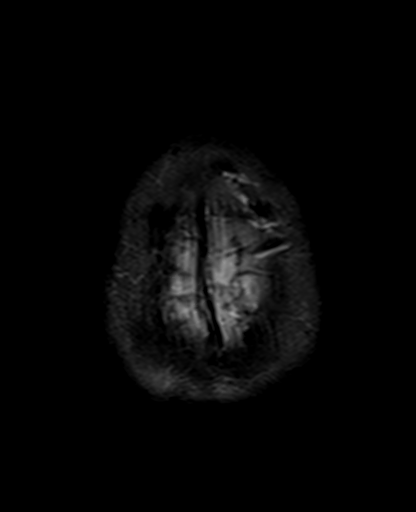

[Series 12: mag_images · axial · 3.0mm · 0.90mm/px · z∈[-51,+121]mm · 4 of 60 slices shown]
[im 1/60]
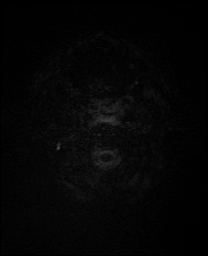
[im 20/60]
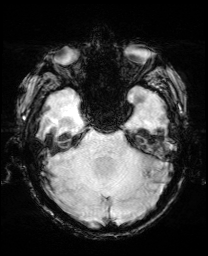
[im 40/60]
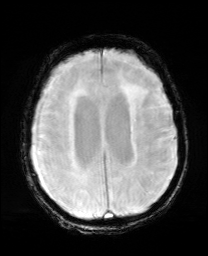
[im 60/60]
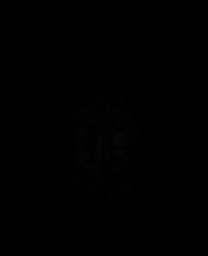

[Series 13: pha_images · axial · 3.0mm · 0.90mm/px · z∈[-51,+118]mm · 4 of 59 slices shown]
[im 1/59]
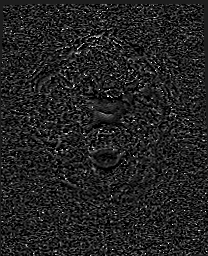
[im 20/59]
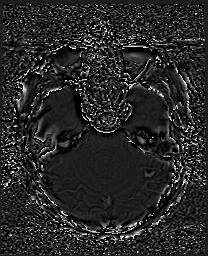
[im 39/59]
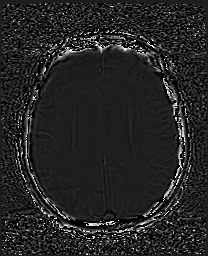
[im 59/59]
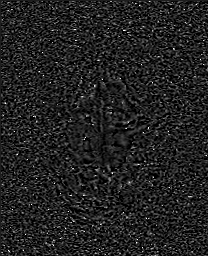

[Series 14: swi_images · axial · 3.0mm · 0.90mm/px · z∈[-51,+121]mm · 4 of 60 slices shown]
[im 1/60]
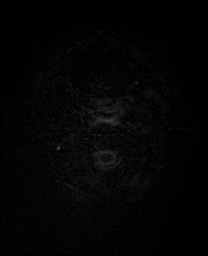
[im 20/60]
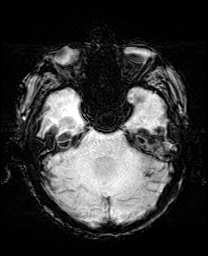
[im 40/60]
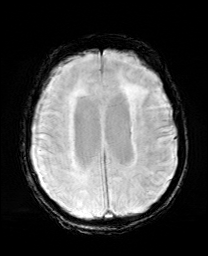
[im 60/60]
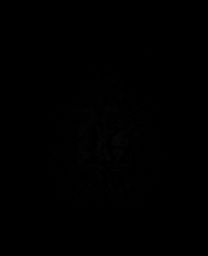

[Series 15: mip_images(sw) · axial · 24.0mm · 0.90mm/px · z∈[-41,+111]mm · 4 of 53 slices shown]
[im 1/53]
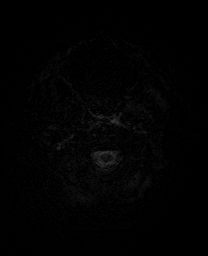
[im 18/53]
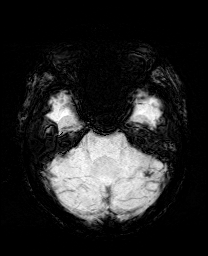
[im 35/53]
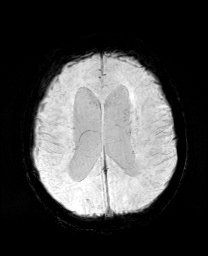
[im 53/53]
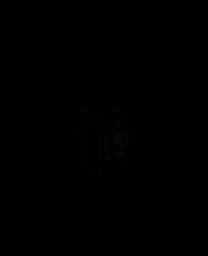

[Series 17: T2 · coronal · 5.0mm · 0.34mm/px · 2 of 29 slices shown (2 of 2)]
[im 1/29]
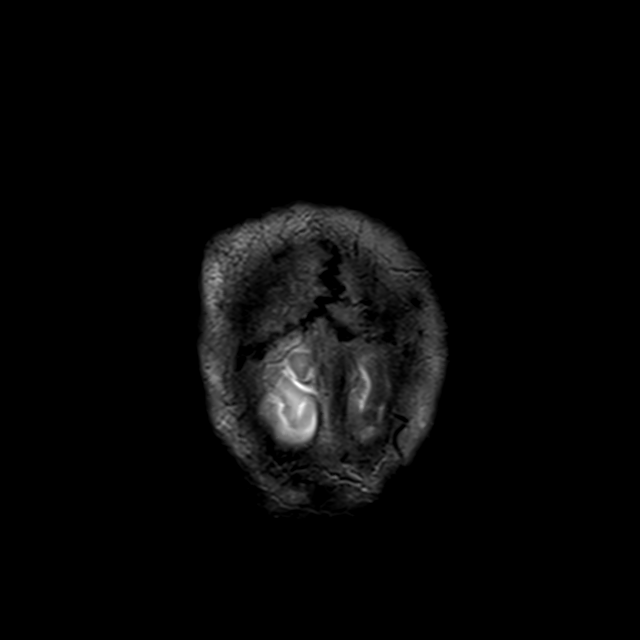
[im 29/29]
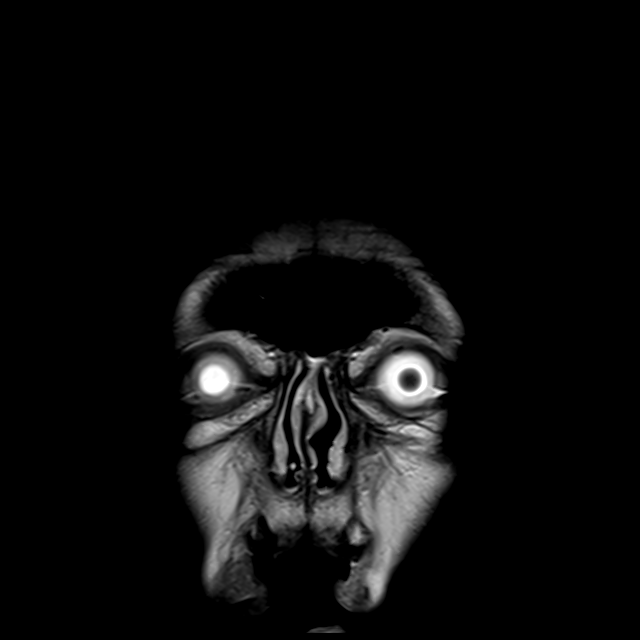

[44 of 48 positions shown; findings below may reference images not displayed]

FINDINGS: Brain: Confluent area of gray and white matter restricted diffusion
in the medial left PCA territory on series 5, image 80 corresponds
to that evident by CT on [DATE]. Additional bilateral parietal
and occipital mostly gyriform abnormal diffusion. However, there are
also a few scattered small foci of anterior circulation restricted
diffusion including suspected frontal lobe cortical and subcortical
white matter involvement seen on series 5. There is a white matter
focus in the left temporal stem.

No other restricted diffusion, the deep gray nuclei, brainstem and
cerebellum remain stable.

Confluent cytotoxic edema in the medial left PCA territory with mild
petechial hemorrhage (series 14, image 30). Gyral T2 and FLAIR
hyperintensity with minimal to mild swelling in the other areas of
abnormal parieto-occipital diffusion. No other acute hemorrhage
identified. There is chronic microhemorrhage in the left occipital
lobe white matter.

Stable cerebral volume. Stable gray and white matter signal outside
of the acute findings. No midline shift, mass effect, evidence of
mass lesion, acute ventriculomegaly, extra-axial collection.
Cervicomedullary junction and pituitary are within normal limits.
Chronic brainstem and cerebellar volume loss.

Vascular: Major intracranial vascular flow voids are stable.

Skull and upper cervical spine: Stable visible cervical spine and
normal bone marrow signal.

Sinuses/Orbits: Stable and negative orbits, paranasal sinuses.

Other: Right side nasoenteric tube and intubated. Small volume fluid
layering in the pharynx. Bilateral mastoid effusions have mildly
increased.
IMPRESSION: 1. Confluent diffusion restriction with cytotoxic edema in the
medial left PCA territory corresponding to the recent CT finding is
compatible with a recent Left PCA infarct. There is mild petechial
hemorrhage but no malignant hemorrhagic transformation or
significant mass effect.

2. But there is other bilateral more nonspecific parieto-occipital
gyral diffusion abnormality.
Anoxia/ischemia and sequelae of seizures are the top differential
considerations in this setting. Superimposed scattered punctate
restricted diffusion in the bilateral anterior circulation favors
ischemic etiology.

3. Otherwise stable noncontrast MRI appearance of the brain since
[DATE].

## 2019-07-14 MED ORDER — HYDRALAZINE HCL 20 MG/ML IJ SOLN
10.0000 mg | INTRAMUSCULAR | Status: DC | PRN
  Administered 2019-07-15 – 2019-07-21 (×2): 20 mg via INTRAVENOUS
  Filled 2019-07-14 (×2): qty 1

## 2019-07-14 MED ORDER — POTASSIUM CHLORIDE 10 MEQ/50ML IV SOLN
10.0000 meq | INTRAVENOUS | Status: DC
  Administered 2019-07-14: 10 meq via INTRAVENOUS
  Filled 2019-07-14: qty 50

## 2019-07-14 MED ORDER — POTASSIUM CHLORIDE 20 MEQ/15ML (10%) PO SOLN: 40.0000 meq | Freq: Once | ORAL | Status: DC

## 2019-07-14 MED ORDER — METOPROLOL TARTRATE 5 MG/5ML IV SOLN
5.0000 mg | Freq: Three times a day (TID) | INTRAVENOUS | Status: DC | PRN
  Administered 2019-07-21: 5 mg via INTRAVENOUS
  Filled 2019-07-14: qty 5

## 2019-07-14 MED ORDER — POTASSIUM CHLORIDE 10 MEQ/50ML IV SOLN
10.0000 meq | INTRAVENOUS | Status: AC
  Administered 2019-07-14 (×3): 10 meq via INTRAVENOUS
  Filled 2019-07-14 (×3): qty 50

## 2019-07-14 MED ORDER — POTASSIUM CHLORIDE 20 MEQ/15ML (10%) PO SOLN
40.0000 meq | Freq: Once | ORAL | Status: AC
  Administered 2019-07-14: 40 meq
  Filled 2019-07-14: qty 30

## 2019-07-14 NOTE — Progress Notes (Signed)
VAST consulted to obtain IV access X2 in order to discontinue IJ central access. Spoke with pt's nurse who stated the patient has 2 IV's which are leaking. IJ is being utilized for infusions at this time. VAST will assess patient's vasculature as soon as able.

## 2019-07-14 NOTE — Progress Notes (Signed)
NAME:  Seth Boyd, MRN:  [id number], DOB:  [birth year], LOS: 69 ADMISSION DATE:  06/27/2019, CONSULTATION DATE:  06/16/2019 REFERRING MD:  Lorin Mercy, CHIEF COMPLAINT:  Encephalopathy   Brief History   85 y.o. male, goes by Seth Boyd, with prior hx HTN/ DM presenting after being found on floor with acute encephalopathy.  Presented as code stroke.  Noted to have initial left gaze.  LSW 1/30 ~4 pm.  Rule out for stroke with negative CTH/ CTA.  Concern for seizure +/- toxic/ metabolic encephalopathy.    Past Medical History  HTN, DM  Significant Hospital Events   1/31 Admit 2/02 extubated 2/03 difficulty with agitation and airway protection, weak cough 2/04 reintubated early am, a flutter New onset a fib/flutter - on heparin 2/5 issues with foley leaking  Started back on propofol  2/6 stable Apneic with PSV  2/8 Extubated; PEA arrest in MRI 3 mins duration. Re-intuabted 2/9 shock developed, hemorrhagic. RP bleed   Consults:  Neurology Cardiology  Procedures:  1/31 ETT >> 2/02 2/04 ETT >> 2/8 2/8 ETT >>  Significant Diagnostic Tests:  1/31 CT head/neck >> 30% ICA stenosis on the right, 20% on the left, 30-50% narrowing in both carotid siphon regions but no correctable proximal stenosis. 1/31 EEG >> moderate diffuse encephalopathy, no epileptiform activity 2/01 brain MRI >> Moderate cerebral atrophy with chronic microvascular ischemic disease, Soft tissue edema within the right occipital/suboccipital scalp. 2/01 LTM  >> moderate diffuse encephalopathy 2/05 Echo >> LVEF 65-70%. Otherwise, unimpressive cxr 07/04/19 Bibasilar infiltrates consistent with pneumonia or aspiration, RIGHT greater than LEFT, slightly increased on RIGHT since prior study MRI 2/8 > No acute intracranial abnormality. Marked parenchymal volume loss. Moderate chronic small vessel ischemia. EEG 2/8 >>> no seizure identified EEG 2/11 > moderate diffuse encephalopathy CT head 2/11 > acute to early subacute infarct of  the left occipital lobe CT A / P 2/11 > large left RP hematoma, trace ascites, small b/l effusions with compressive atelectasis   Micro Data:  1/31 SARS 2/ flu A/B >> neg 1/31 MRSA PCR >> negative 2/8 resp >> Few enterobacter  Antimicrobials:  Unasyn 2/02 >> 2/8 Zosyn 2/9 > 2/10 Vanco 2/9 > 2/10  Ceftriaxone 2/10 >  Interim history/subjective:   Remains critically ill, intubated Has been off pressors over the weekend, improving urine output Afebrile   Objective   Blood pressure (!) 170/63, pulse 95, temperature 98.4 F (36.9 C), resp. rate (!) 24, height 5\' 10"  (1.778 m), weight 78.7 kg, SpO2 96 %.    Vent Mode: PRVC FiO2 (%):  [30 %] 30 % Set Rate:  [24 bmp] 24 bmp Vt Set:  [510 mL] 510 mL PEEP:  [5 cmH20] 5 cmH20 Plateau Pressure:  [16 cmH20-18 cmH20] 16 cmH20   Intake/Output Summary (Last 24 hours) at 07/14/2019 7829 Last data filed at 07/14/2019 0900 Gross per 24 hour  Intake 620 ml  Output 1005 ml  Net -385 ml   Filed Weights   07/09/19 0351 07/11/19 0500 07/12/19 0500  Weight: 68.3 kg 83 kg 78.7 kg   Examination:  General: Elderly gentleman, acutely ill, on vent HEENT: Moist oral mucosa pupils reacting, oral ETT Neuro: Spontaneously opens eyes, not following commands, tremor-like movements CV: S1-S2 appreciated PULM: Coarse breath sounds, reduced at the bases GI: soft, bs+, condom cath  [facility]  Chest x-ray 2/13 personally reviewed left lower lobe atelectasis versus pneumonia  Labs show elevated sugars 2/14-hypernatremia, hypokalemia, improving creatinine, decreasing LFTs, improving leukocytosis  Assessment & Plan:  Acute  hypoxic respiratory failure with compromised airway Cardiac arrest Concern for central apneas -Start spontaneous breathing trials -Will need tracheostomy if we are to push forward -VAP bundle in place  Acute metabolic encephalopathy -No seizures on LTM EEG x 2 -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, stroke work-up  per neurology -On Keppra -Waiting to see if mental status improves as organ dysfunction resolves   New onset atrial flutter Some suspicion for sick sinus syndrome with Occasional episodes of bradycardia -Not on anticoagulation secondary to recent retroperitoneal bleed -Use Lopressor or hydralazine as needed for hypertension  Dysphagia Protein calorie malnutrition -Continue tube feeds  Diabetes, type II -SSI and Levemir  Acute kidney injury , improving -Hoping for full renal recovery here -Trend electrolytes  Shock liver , improving -Trend LFTs  Hemorrhagic shock secondary to retroperitoneal bleed, resolved -Trend CBC  Aspiration pneumonia -Ceftriaxone until 2/16   Best practice:  Diet: tube feeds DVT prophylaxis: SCDs only, heparin gtt on hold GI prophylaxis: PPI  Glucose control: monitor Mobility: bed Code Status: DNR Disposition: ICU  The patient is critically ill with multiple organ systems failure and requires high complexity decision making for assessment and support, frequent evaluation and titration of therapies, application of advanced monitoring technologies and extensive interpretation of multiple databases. Critical Care Time devoted to patient care services described in this note independent of APP/resident  time is 33 minutes.   Cyril Mourning MD. Tonny Bollman. [place] [facility]  If no response to pager , please call [phone]   07/14/2019

## 2019-07-14 NOTE — Progress Notes (Signed)
CRITICAL VALUE STICKER  CRITICAL VALUE: K+ 2.5  RECEIVER (on-site recipient of call): Andreas Ohm RN  DATE & TIME NOTIFIED: 07/14/19 @ 1305   MESSENGER (representative from lab):   MD NOTIFIED: Cyril Mourning, MD  TIME OF NOTIFICATION:1344  RESPONSE: see new orders

## 2019-07-14 NOTE — Progress Notes (Signed)
Spoke with bedside nurse regarding discontinuation of IJ central line as patient required potassium supplemental infusion today and it is not recommended to run via Midline.  Patient has potassium level recheck due at 0100 therefore RN would like to wait until this is resulted in case additional runs are required.

## 2019-07-14 NOTE — Progress Notes (Signed)
Subjective: No significant change   I have reviewed his time course, Initially presented on 1/31 with focal deficits including right hemianopia, left gaze possible right facial droop.  CT A/P did not reveal an explanation, stat EEG just showed generalized encephalopathy.  There was concern that he aspirated while in the ED, and he was not protecting his airway.  He was therefore intubated at that time.  He was following commands the next day.  On 2/2, he was alert, oriented speech without aphasia, essentially nonfocal exam.  Continuous EEG was negative.  He continued to have waxing/waning mental status with episodes of apnea.  A repeat MRI was obtained on 2/9, and shortly following this he had a PEA arrest.  He was reconnected to continuous EEG which again was negative. On 2/10 he developed hemorrhagic shock due to retroperitoneal bleed.  Since that time he has remained encephalopathic, he had a repeat CT on 2/11 which demonstrated an established PCA infarct.      Exam: Vitals:   07/14/19 1010 07/14/19 1100  BP: 105/88 (!) 150/59  Pulse: 93 89  Resp: (!) 24 (!) 24  Temp: 98.4 F (36.9 C) 98.4 F (36.9 C)  SpO2: 97% 98%   Gen: In bed, NAD Resp: non-labored breathing, no acute distress Abd: soft, nt  Neuro: MS: Does not open eyes or follow commands.  He does look away from a bright light. CN: Pupils equal round reactive, corneals are intact, he does look away from Motor: Cortically based withdrawal in all 4 extremities.  He has choreiform movements. Sensory: Response to neck stimulation x4  Pertinent Labs: Hyponatremia, hypokalemia  Impression: 85 year old male with very complicated hospital course.  His initial presentation could have been TIA or postictal state, but he initially returned to essentially baseline status.  I am uncertain what could have been causing his apneic episodes.  He was after his hemorrhagic shock/arrest that he has been more encephalopathic.  The PCA infarct  is not enough to explain his current state, and I do worry that he might of had other smaller emboli that might be playing a role in his mental status.  If due solely to hypoxic injury, with cortically based withdrawal, I would favor continued observation.  An MRI could give more information, and though he is already had 2 with this hospitalization, we would be investigating the change that happened since the second MRI.  Recommendations: 1) repeat MRI brain 2) continue Keppra  Ritta Slot, MD [facility] [phone]  If 7pm- 7am, please page neurology on call as listed in AMION.

## 2019-07-15 ENCOUNTER — Inpatient Hospital Stay (HOSPITAL_COMMUNITY): Payer: Medicare HMO

## 2019-07-15 LAB — BASIC METABOLIC PANEL
Anion gap: 10 (ref 5–15)
Anion gap: 9 (ref 5–15)
BUN: 59 mg/dL — ABNORMAL HIGH (ref 8–23)
BUN: 42 mg/dL — ABNORMAL HIGH (ref 8–23)
CO2: 29 mmol/L (ref 22–32)
CO2: 24 mmol/L (ref 22–32)
Calcium: 8 mg/dL — ABNORMAL LOW (ref 8.9–10.3)
Calcium: 7 mg/dL — ABNORMAL LOW (ref 8.9–10.3)
Chloride: 120 mmol/L — ABNORMAL HIGH (ref 98–111)
Chloride: 122 mmol/L — ABNORMAL HIGH (ref 98–111)
Creatinine, Ser: 1.22 mg/dL (ref 0.61–1.24)
Creatinine, Ser: 1.03 mg/dL (ref 0.61–1.24)
GFR calc Af Amer: >60 mL/min (ref >60)
GFR calc Af Amer: >60 mL/min (ref >60)
GFR calc non Af Amer: 55 mL/min — ABNORMAL LOW (ref >60)
GFR calc non Af Amer: >60 mL/min (ref >60)
Glucose, Bld: 159 mg/dL — ABNORMAL HIGH (ref 70–99)
Glucose, Bld: 195 mg/dL — ABNORMAL HIGH (ref 70–99)
Potassium: 2.7 mmol/L — CL (ref 3.5–5.1)
Potassium: 3.2 mmol/L — ABNORMAL LOW (ref 3.5–5.1)
Sodium: 159 mmol/L — ABNORMAL HIGH (ref 135–145)
Sodium: 155 mmol/L — ABNORMAL HIGH (ref 135–145)

## 2019-07-15 LAB — CBC
HCT: 31.2 % — ABNORMAL LOW (ref 39.0–52.0)
Hemoglobin: 9.6 g/dL — ABNORMAL LOW (ref 13.0–17.0)
MCH: 30.1 pg (ref 26.0–34.0)
MCHC: 30.8 g/dL (ref 30.0–36.0)
MCV: 97.8 fL (ref 80.0–100.0)
Platelets: 273 10*3/uL (ref 150–400)
RBC: 3.19 MIL/uL — ABNORMAL LOW (ref 4.22–5.81)
RDW: 15.6 % — ABNORMAL HIGH (ref 11.5–15.5)
WBC: 9.7 10*3/uL (ref 4.0–10.5)
nRBC: 0.4 % — ABNORMAL HIGH (ref 0.0–0.2)

## 2019-07-15 LAB — GLUCOSE, CAPILLARY
Glucose-Capillary: 119 mg/dL — ABNORMAL HIGH (ref 70–99)
Glucose-Capillary: 129 mg/dL — ABNORMAL HIGH (ref 70–99)
Glucose-Capillary: 130 mg/dL — ABNORMAL HIGH (ref 70–99)
Glucose-Capillary: 186 mg/dL — ABNORMAL HIGH (ref 70–99)
Glucose-Capillary: 164 mg/dL — ABNORMAL HIGH (ref 70–99)
Glucose-Capillary: 169 mg/dL — ABNORMAL HIGH (ref 70–99)

## 2019-07-15 LAB — HEPATIC FUNCTION PANEL
ALT: 579 U/L — ABNORMAL HIGH (ref 0–44)
AST: 95 U/L — ABNORMAL HIGH (ref 15–41)
Albumin: 2.1 g/dL — ABNORMAL LOW (ref 3.5–5.0)
Alkaline Phosphatase: 119 U/L (ref 38–126)
Bilirubin, Direct: 0.3 mg/dL — ABNORMAL HIGH (ref 0.0–0.2)
Indirect Bilirubin: 1.3 mg/dL — ABNORMAL HIGH (ref 0.3–0.9)
Total Bilirubin: 1.6 mg/dL — ABNORMAL HIGH (ref 0.3–1.2)
Total Protein: 5.2 g/dL — ABNORMAL LOW (ref 6.5–8.1)

## 2019-07-15 LAB — PHOSPHORUS: Phosphorus: 2.5 mg/dL (ref 2.5–4.6)

## 2019-07-15 LAB — MAGNESIUM: Magnesium: 2.1 mg/dL (ref 1.7–2.4)

## 2019-07-15 LAB — POTASSIUM: Potassium: 2.8 mmol/L — ABNORMAL LOW (ref 3.5–5.1)

## 2019-07-15 IMAGING — DX DG CHEST 1V PORT
1 series · 1 of 1 positions shown · non-contrast
Comparison: [DATE]

CLINICAL DATA: Respiratory failure

EXAM:
PORTABLE CHEST 1 VIEW

[chest ap]
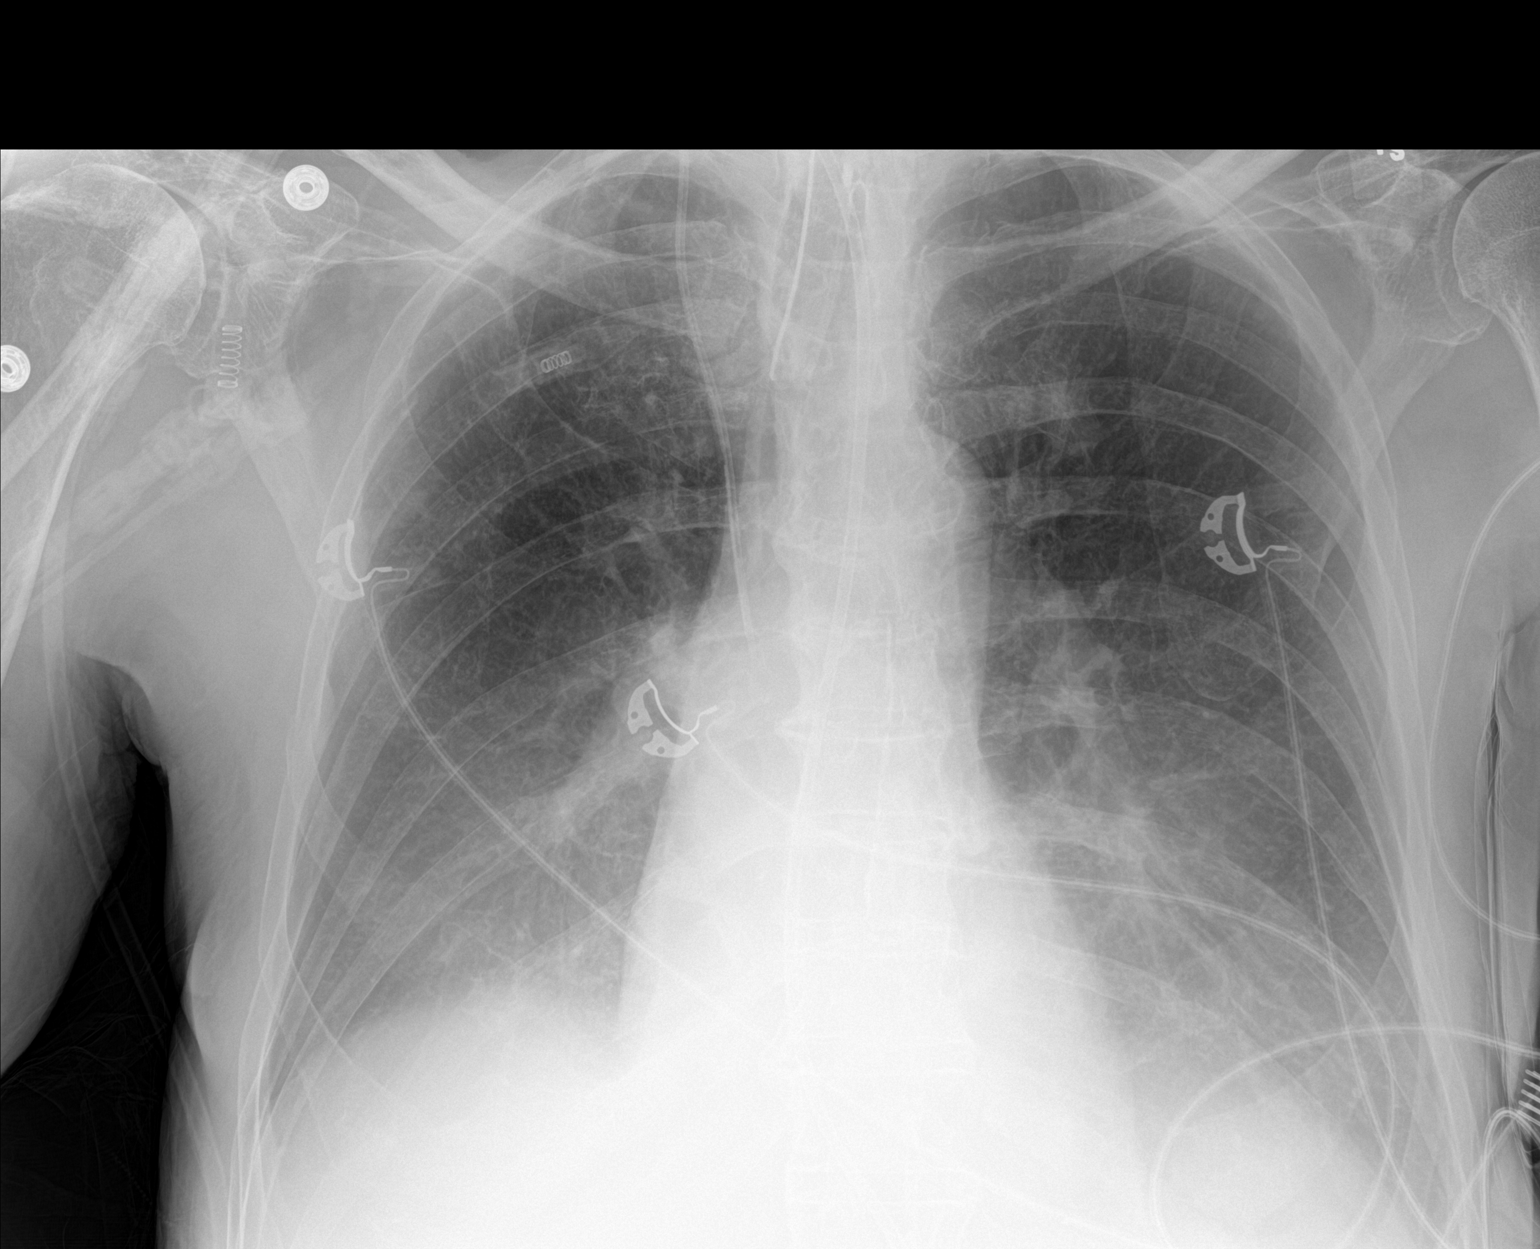

[1 of 1 positions shown; findings below may reference images not displayed]

FINDINGS: Endotracheal tube, right central line and feeding tube remain in
place, unchanged. Suspect layering pleural effusions. Bibasilar
atelectasis. Findings of worsened since prior study. Heart is normal
size. No acute bony abnormality.
IMPRESSION: Suspect layering pleural effusions with bibasilar atelectasis.

## 2019-07-15 MED ORDER — DEXTROSE 5 % IV SOLN: INTRAVENOUS | Status: DC

## 2019-07-15 MED ORDER — OSMOLITE 1.2 CAL PO LIQD
1000.0000 mL | ORAL | Status: DC
  Administered 2019-07-15 – 2019-07-16 (×2): 1000 mL
  Filled 2019-07-15 (×6): qty 1000

## 2019-07-15 MED ORDER — POTASSIUM CHLORIDE 20 MEQ/15ML (10%) PO SOLN
40.0000 meq | ORAL | Status: AC
  Administered 2019-07-15 (×3): 40 meq
  Filled 2019-07-15 (×3): qty 30

## 2019-07-15 MED ORDER — POTASSIUM PHOSPHATES 15 MMOLE/5ML IV SOLN
20.0000 mmol | Freq: Once | INTRAVENOUS | Status: AC
  Administered 2019-07-15: 20 mmol via INTRAVENOUS
  Filled 2019-07-15: qty 6.67

## 2019-07-15 MED ORDER — FREE WATER
300.0000 mL | Status: DC
  Administered 2019-07-15 – 2019-07-21 (×37): 300 mL

## 2019-07-15 NOTE — Progress Notes (Signed)
Subjective: No significant change     Exam: Vitals:   07/15/19 0900 07/15/19 1000  BP: (!) 167/146 (!) 168/121  Pulse: 83 (!) 58  Resp: (!) 24 (!) 24  Temp: 98.1 F (36.7 C) 98.2 F (36.8 C)  SpO2: 95% 93%   Gen: In bed, NAD Resp: non-labored breathing, no acute distress Abd: soft, nt  Neuro: MS: Eyes open to noxious stimulation.  Does not follow commands CN: Pupils equal round reactive, corneals are intact, appears to fixate briefly, but does not clearly follow with smooth pursuit.  Motor: withdrawal in all 4 extremities.  He has choreiform movements and some triple flexion to plantar stimulation in the left leg.  Sensory: Response to neck stimulation x4  Pertinent Labs: Hyponatremia, hypokalemia  Impression: 85 year old male with very complicated hospital course.  His initial presentation could have been TIA or postictal state, but he initially returned to essentially baseline status.  I am uncertain what could have been causing his apneic episodes.  It was after his hemorrhagic shock/arrest that he has been more encephalopathic and MRI shows changes that are most consistent with hypoxic brain injury.   Given that area has some subtle findings of consciousness (appears to fixate and has withdrawal to noxious stimulation) there is a possibility that he could have some continued improvement, though I do not expect that he would ever return to baseline given the changes on MRI.  In someone who is young with relatively few medical problems, I would think aggressive care would still be prudent in his situation, but in an 85 year old with other medical comorbidities I think the decision to pursue tracheostomy/long-term care in someone with brain injury on MRI has to be carefully considered as to what the patient's wishes would be in this type of situation.   Recommendations: 1) continue Keppra 2) continue family discussions    Ritta Slot, MD [facility] [phone]  If 7pm- 7am, please page neurology on call as listed in AMION.

## 2019-07-15 NOTE — Progress Notes (Signed)
eLink Physician-Brief Progress Note Patient Name: Seth Boyd DOB: 03-29-1935 MRN: [id number]   Date of Service  07/15/2019  HPI/Events of Note  K 2.8  eICU Interventions  Ordered K 40 meqs per tube x 3 doses     Intervention Category Major Interventions: Electrolyte abnormality - evaluation and management  Darl Pikes 07/15/2019, 5:39 AM

## 2019-07-15 NOTE — Progress Notes (Signed)
Spoke with RN Osborne Casco about IV consult, at this time the central line is still being used and RN states will replace IV team consult if a PIV is further needed

## 2019-07-15 NOTE — Progress Notes (Signed)
NAME:  Seth Boyd, MRN:  [id number], DOB:  06-20-1934, LOS: 16 ADMISSION DATE:  2019-07-07, CONSULTATION DATE:  07/07/2019 REFERRING MD:  Ophelia Charter, CHIEF COMPLAINT:  Encephalopathy   Brief History   85 y.o. male, goes by Seth Boyd, with prior hx HTN/ DM presenting after being found on floor with acute encephalopathy.  Presented as code stroke.  Noted to have initial left gaze.  LSW 1/30 ~4 pm.  Rule out for stroke with negative CTH/ CTA.  Concern for seizure +/- toxic/ metabolic encephalopathy.    Past Medical History  HTN, DM  Significant Hospital Events   1/31 Admit 2/02 extubated 2/03 difficulty with agitation and airway protection, weak cough 2/04 reintubated early am, a flutter New onset a fib/flutter - on heparin 2/5 issues with foley leaking  Started back on propofol  2/6 stable Apneic with PSV  2/8 Extubated; PEA arrest in MRI 3 mins duration. Re-intuabted 2/9 shock developed, hemorrhagic. RP bleed  2/13 off pressors  Consults:  Neurology Cardiology  Procedures:  1/31 ETT >> 2/02 2/04 ETT >> 2/8 2/8 ETT >>  Significant Diagnostic Tests:  1/31 CT head/neck >> 30% ICA stenosis on the right, 20% on the left, 30-50% narrowing in both carotid siphon regions but no correctable proximal stenosis. 1/31 EEG >> moderate diffuse encephalopathy, no epileptiform activity 2/01 brain MRI >> Moderate cerebral atrophy with chronic microvascular ischemic disease, Soft tissue edema within the right occipital/suboccipital scalp. 2/01 LTM  >> moderate diffuse encephalopathy 2/05 Echo >> LVEF 65-70%. Otherwise, unimpressive cxr 07/04/19 Bibasilar infiltrates consistent with pneumonia or aspiration, RIGHT greater than LEFT, slightly increased on RIGHT since prior study MRI 2/8 > No acute intracranial abnormality. Marked parenchymal volume loss. Moderate chronic small vessel ischemia. EEG 2/8 >>> no seizure identified EEG 2/11 > moderate diffuse encephalopathy CT head 2/11 > acute to early  subacute infarct of the left occipital lobe CT A / P 2/11 > large left RP hematoma, trace ascites, small b/l effusions with compressive atelectasis  MRI brain 2/15 >>recent Left PCA infarct, bilateral more nonspecific parieto-occipital gyral diffusion abnormality ? Anoxia/ischemia and sequelae of seizures    Micro Data:  1/31 SARS 2/ flu A/B >> neg 1/31 MRSA PCR >> negative 2/8 resp >> Few enterobacter  Antimicrobials:  Unasyn 2/02 >> 2/8 Zosyn 2/9 > 2/10 Vanco 2/9 > 2/10  Ceftriaxone 2/10 > 2/16   Interim history/subjective:   Remains intubated More awake , mains off sedation Afebrile   Objective   Blood pressure 139/68, pulse 89, temperature 98.1 F (36.7 C), resp. rate (!) 24, height 5\' 10"  (1.778 m), weight 78.7 kg, SpO2 99 %.    Vent Mode: PRVC FiO2 (%):  [30 %-40 %] 40 % Set Rate:  [24 bmp] 24 bmp Vt Set:  [510 mL] 510 mL PEEP:  [5 cmH20] 5 cmH20 Plateau Pressure:  [14 cmH20-17 cmH20] 16 cmH20   Intake/Output Summary (Last 24 hours) at 07/15/2019 0857 Last data filed at 07/15/2019 07/17/2019 Gross per 24 hour  Intake 937.46 ml  Output 1670 ml  Net -732.54 ml   Filed Weights   07/09/19 0351 07/11/19 0500 07/12/19 0500  Weight: 68.3 kg 83 kg 78.7 kg   Examination:  General: Elderly gentleman, acutely ill, on vent HEENT: Moist oral mucosa pupils reacting, oral ETT Neuro: Spontaneously opens eyes, seem like he grip my right hand but not consistent, does not follow other commands, tremor-like movements CV: S1-S2 appreciated PULM: Coarse breath sounds, reduced at the bases GI: soft, bs+, condom cath  Skin-anasarca  Chest x-ray 2/16 shows bilateral lower lobe layering effusions and?  Infiltrate  Labs show hypernatremia, severe hypokalemia, improving renal function, decreasing LFTs, no leukocytosis and stable anemia  Assessment & Plan:  Acute hypoxic respiratory failure with compromised airway Concern for central apneas -Spontaneous breathing trials as  tolerated -Will need tracheostomy if we are to push forward , but await neurological prognosis before pursuing this -VAP bundle in place  Acute metabolic encephalopathy -hypernatremia and MRI not showing signs of anoxic injury -No seizures on LTM EEG x 2 -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 -Ct Keppra -Waiting to see if mental status improves now that other organs are recovering  PEA Cardiac arrest New onset atrial flutter Some suspicion for sick sinus syndrome with Occasional episodes of bradycardia -Not on anticoagulation secondary to recent retroperitoneal bleed -Use Lopressor or hydralazine as needed for hypertension  Dysphagia Protein calorie malnutrition -Continue tube feeds  Diabetes, type II -SSI and Levemir  Acute kidney injury , resolving -Hypokalemia and hypophosphatemia will be aggressively repleted and recheck -For hypernatremia, start D5W at 75/hour and free water 300 every 4 -Hold off diuresis until metabolic abnormalities corrected  Shock liver , improving -Trend LFTs  Acute blood loss anemia secondary to retroperitoneal bleed -Trend hemoglobin  Resolved problems Hemorrhagic shock   Aspiration pneumonia -Ceftriaxone until 2/16   Best practice:  Diet: tube feeds DVT prophylaxis: SCDs only, heparin gtt on hold GI prophylaxis: PPI  Glucose control: monitor Mobility: bed Code Status: DNR Family : son, [name] Disposition: ICU  The patient is critically ill with multiple organ systems failure and requires high complexity decision making for assessment and support, frequent evaluation and titration of therapies, application of advanced monitoring technologies and extensive interpretation of multiple databases. Critical Care Time devoted to patient care services described in this note independent of APP/resident  time is 35 minutes.     Kara Mead MD. Shade Flood. [place] [facility]  If no response to pager , please call [phone]    07/15/2019

## 2019-07-15 NOTE — Progress Notes (Signed)
Pt's son wanted a phone called in the evening on how pt is doing. I called him and gave him an update. Pt is currently stable and  I hasn't seen any changes from when he was here earlier today.

## 2019-07-16 LAB — CBC
HCT: 30.5 % — ABNORMAL LOW (ref 39.0–52.0)
Hemoglobin: 9.2 g/dL — ABNORMAL LOW (ref 13.0–17.0)
MCH: 29.6 pg (ref 26.0–34.0)
MCHC: 30.2 g/dL (ref 30.0–36.0)
MCV: 98.1 fL (ref 80.0–100.0)
Platelets: 256 10*3/uL (ref 150–400)
RBC: 3.11 MIL/uL — ABNORMAL LOW (ref 4.22–5.81)
RDW: 15.6 % — ABNORMAL HIGH (ref 11.5–15.5)
WBC: 8.6 10*3/uL (ref 4.0–10.5)
nRBC: 0 % (ref 0.0–0.2)

## 2019-07-16 LAB — GLUCOSE, CAPILLARY
Glucose-Capillary: 135 mg/dL — ABNORMAL HIGH (ref 70–99)
Glucose-Capillary: 137 mg/dL — ABNORMAL HIGH (ref 70–99)
Glucose-Capillary: 141 mg/dL — ABNORMAL HIGH (ref 70–99)
Glucose-Capillary: 143 mg/dL — ABNORMAL HIGH (ref 70–99)
Glucose-Capillary: 164 mg/dL — ABNORMAL HIGH (ref 70–99)
Glucose-Capillary: 165 mg/dL — ABNORMAL HIGH (ref 70–99)

## 2019-07-16 LAB — BASIC METABOLIC PANEL
Anion gap: 10 (ref 5–15)
BUN: 41 mg/dL — ABNORMAL HIGH (ref 8–23)
CO2: 26 mmol/L (ref 22–32)
Calcium: 7.9 mg/dL — ABNORMAL LOW (ref 8.9–10.3)
Chloride: 118 mmol/L — ABNORMAL HIGH (ref 98–111)
Creatinine, Ser: 0.97 mg/dL (ref 0.61–1.24)
GFR calc Af Amer: >60 mL/min (ref >60)
GFR calc non Af Amer: >60 mL/min (ref >60)
Glucose, Bld: 199 mg/dL — ABNORMAL HIGH (ref 70–99)
Potassium: 3.2 mmol/L — ABNORMAL LOW (ref 3.5–5.1)
Sodium: 154 mmol/L — ABNORMAL HIGH (ref 135–145)

## 2019-07-16 LAB — PHOSPHORUS: Phosphorus: 3 mg/dL (ref 2.5–4.6)

## 2019-07-16 LAB — MAGNESIUM: Magnesium: 1.9 mg/dL (ref 1.7–2.4)

## 2019-07-16 MED ORDER — CHLORHEXIDINE GLUCONATE CLOTH 2 % EX PADS
6.0000 | MEDICATED_PAD | Freq: Every day | CUTANEOUS | Status: DC
  Administered 2019-07-16 – 2019-07-21 (×6): 6 via TOPICAL

## 2019-07-16 MED ORDER — FUROSEMIDE 10 MG/ML IJ SOLN
40.0000 mg | Freq: Once | INTRAMUSCULAR | Status: AC
  Administered 2019-07-16: 40 mg via INTRAVENOUS
  Filled 2019-07-16: qty 4

## 2019-07-16 MED ORDER — DOXAZOSIN MESYLATE 1 MG PO TABS
1.0000 mg | ORAL_TABLET | Freq: Every day | ORAL | Status: DC
  Administered 2019-07-16 – 2019-07-21 (×6): 1 mg
  Filled 2019-07-16 (×8): qty 1

## 2019-07-16 MED ORDER — POTASSIUM CHLORIDE 20 MEQ/15ML (10%) PO SOLN
40.0000 meq | ORAL | Status: AC
  Administered 2019-07-16 (×2): 40 meq
  Filled 2019-07-16 (×2): qty 30

## 2019-07-16 NOTE — Progress Notes (Addendum)
Subjective: No significant changes    Exam: Vitals:   07/16/19 1530 07/16/19 1600  BP: (!) 142/81 (!) 148/89  Pulse: 92 (!) 101  Resp: 15 15  Temp: 98.8 F (37.1 C) 99 F (37.2 C)  SpO2: 100% 100%   Gen: In bed, NAD Resp: non-labored breathing, no acute distress Abd: soft, nt  Neuro: MS: Eyes open spontaneously.  When asked to stick out his tongue, he opens his mouth.  When asked to wiggle his toes he wiggles his foot, and when asked to show thumb he grips his hand.  These types of responses are concerning for some degree of aphasia, but it is evidence of consciousness.  CN: Pupils equal round reactive, corneals are intact, fixates and tracks across midline in both directions Motor: withdrawal in all 4 extremities.  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  His initial presentation could have been TIA or postictal state, but he initially returned to essentially baseline status.  I am uncertain what could have been causing his apneic episodes.  It was after his hemorrhagic shock/arrest that he has been more encephalopathic and MRI shows changes that are most consistent with hypoxic brain injury.   Given that he is showing signs of improvement, I think that there is a chance this improvement could continue, though there is no guarantee of a good outcome, with him now fopllowing commands, I think there is a significant chance of continued improvement.   It is still unclear to me if he needs long term AED therapy, but Keppra could be helpful for the myoclonus as well.  I am not sure I would aggressively target the abnormal movements and less it is interfering with his rehab.  At this point, I think supportive care is the mainstay of therapy.  Any recovery that he has will be over the  intermediate to long-term, and may be incomplete, but I do think there is potential for recovery.  Recommendations: 1) continue Keppra 2) neurology will continue to be available on an as-needed basis, will check in later this week   Ritta Slot, MD [facility] [phone]  If 7pm- 7am, please page neurology on call as listed in AMION.

## 2019-07-16 NOTE — Progress Notes (Signed)
NAME:  Seth Boyd, MRN:  [id number], DOB:  1934/07/06, LOS: 17 ADMISSION DATE:  2019-07-22, CONSULTATION DATE:  07/22/19 REFERRING MD:  Seth Boyd, CHIEF COMPLAINT:  Encephalopathy   Brief History   85 y.o. male, goes by Seth Boyd, with prior hx HTN/ DM presenting after being found on floor with acute encephalopathy.  Presented as code stroke.  Noted to have initial left gaze.  LSW 1/30 ~4 pm.  Rule out for stroke with negative CTH/ CTA.  Concern for seizure +/- toxic/ metabolic encephalopathy.    Past Medical History  HTN, DM  Significant Hospital Events   1/31 Admit 2/02 extubated 2/03 difficulty with agitation and airway protection, weak cough 2/04 reintubated early am, a flutter New onset a fib/flutter - on heparin 2/5 issues with foley leaking  Started back on propofol  2/6 stable Apneic with PSV  2/8 Extubated; PEA arrest in MRI 3 mins duration. Re-intuabted 2/9 shock developed, hemorrhagic. RP bleed  2/13 off pressors  Consults:  Neurology Cardiology  Procedures:  1/31 ETT >> 2/02 2/04 ETT >> 2/8 2/8 ETT >> 2/10 RIJ CVL >>  Significant Diagnostic Tests:  1/31 CT head/neck >> 30% ICA stenosis on the right, 20% on the left, 30-50% narrowing in both carotid siphon regions but no correctable proximal stenosis. 1/31 EEG >> moderate diffuse encephalopathy, no epileptiform activity 2/01 brain MRI >> Moderate cerebral atrophy with chronic microvascular ischemic disease, Soft tissue edema within the right occipital/suboccipital scalp. 2/01 LTM  >> moderate diffuse encephalopathy 2/05 Echo >> LVEF 65-70%. Otherwise, unimpressive cxr 07/04/19 Bibasilar infiltrates consistent with pneumonia or aspiration, RIGHT greater than LEFT, slightly increased on RIGHT since prior study MRI 2/8 > No acute intracranial abnormality. Marked parenchymal volume loss. Moderate chronic small vessel ischemia. EEG 2/8 >>> no seizure identified EEG 2/11 > moderate diffuse encephalopathy CT head 2/11 >  acute to early subacute infarct of the left occipital lobe CT A / P 2/11 > large left RP hematoma, trace ascites, small b/l effusions with compressive atelectasis  MRI brain 2/15 >>recent Left PCA infarct, bilateral more nonspecific parieto-occipital gyral diffusion abnormality ? Anoxia/ischemia and sequelae of seizures    Micro Data:  1/31 SARS 2/ flu A/B >> neg 1/31 MRSA PCR >> negative 2/8 resp >> Few enterobacter  Antimicrobials:  Unasyn 2/02 >> 2/8 Zosyn 2/9 > 2/10 Vanco 2/9 > 2/10  Ceftriaxone 2/10 > 2/16   Interim history/subjective:   More awake Good urine output Afebrile, Remains critically ill, intubated   Objective   Blood pressure (!) 159/65, pulse 79, temperature 99.1 F (37.3 C), resp. rate 13, height 5\' 10"  (1.778 m), weight 78.7 kg, SpO2 100 %.    Vent Mode: SIMV;PRVC;PSV FiO2 (%):  [40 %] 40 % Set Rate:  [8 bmp-24 bmp] 8 bmp Vt Set:  [510 mL] 510 mL PEEP:  [5 cmH20] 5 cmH20 Pressure Support:  [10 cmH20] 10 cmH20 Plateau Pressure:  [15 cmH20-17 cmH20] 17 cmH20   Intake/Output Summary (Last 24 hours) at 07/16/2019 1058 Last data filed at 07/16/2019 1000 Gross per 24 hour  Intake 4075.27 ml  Output 1490 ml  Net 2585.27 ml   Filed Weights   07/09/19 0351 07/11/19 0500 07/12/19 0500  Weight: 68.3 kg 83 kg 78.7 kg   Examination:  General: Elderly gentleman, acutely ill, on vent HEENT: Moist oral mucosa pupils reacting, oral ETT, mild pallor, no icterus Neuro: Awake, follows simple commands, tremors?  Myoclonic jerks of both hands and shoulder CV: S1-S2 distant PULM: Coarse breath sounds,  reduced at the bases GI: soft, bs+, condom cath  [facility]  Chest x-ray 2/16 shows bilateral lower lobe layering effusions and?  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 -Ct Keppra   PEA Cardiac arrest New onset atrial flutter Some suspicion for sick sinus syndrome with Occasional episodes of bradycardia -Not on anticoagulation secondary to recent retroperitoneal bleed -Use Lopressor or hydralazine as needed for hypertension  Dysphagia Protein calorie malnutrition -Continue tube feeds  Diabetes, type II -SSI and tube feed coverage -CBG upto 180 acceptable  Acute kidney injury , resolving -Hypokalemia and hypophosphatemia will be aggressively repleted and recheck Hypernatremia -improving, continue D5W at 75/hour and free water 300 every 4 -Lasix 40x1 today  Shock liver , improving -Trend LFTs intermittently  Acute blood loss anemia secondary to retroperitoneal bleed -Stable, transfuse for hemoglobin 7 or lower  Resolved problems Hemorrhagic shock   Aspiration pneumonia -Ceftriaxone until 2/16  Summary -initial presentation thought to be TIA or postictal state .  Each time after extubation returned to his baseline state.  Not clear what was causing a central apneas.  Encephalopathy after hemorrhagic shock due to retroperitoneal bleed seems to be related to hypoxic brain injury.  Since this has improved, seems reasonable to push forward with tracheostomy and see if he would be rehab-able   Best practice:  Diet: tube feeds DVT prophylaxis: SCDs only, heparin gtt on hold GI prophylaxis: PPI  Glucose control: monitor Mobility: bed Code Status: DNR Family : son, Seth Boyd updated daily Disposition: ICU   The patient is critically ill with multiple organ systems failure and requires high complexity decision  making for assessment and support, frequent evaluation and titration of therapies, application of advanced monitoring technologies and extensive interpretation of multiple databases. Critical Care Time devoted to patient care services described in this note independent of APP/resident  time is 35 minutes.     Kara Mead MD. Shade Flood. [facility]  If no response to pager , please call [phone]   07/16/2019

## 2019-07-17 DIAGNOSIS — R58 Hemorrhage, not elsewhere classified: Secondary | ICD-10-CM

## 2019-07-17 LAB — CBC
HCT: 29 % — ABNORMAL LOW (ref 39.0–52.0)
Hemoglobin: 8.6 g/dL — ABNORMAL LOW (ref 13.0–17.0)
MCH: 29.4 pg (ref 26.0–34.0)
MCHC: 29.7 g/dL — ABNORMAL LOW (ref 30.0–36.0)
MCV: 99 fL (ref 80.0–100.0)
Platelets: 241 10*3/uL (ref 150–400)
RBC: 2.93 MIL/uL — ABNORMAL LOW (ref 4.22–5.81)
RDW: 14.6 % (ref 11.5–15.5)
WBC: 9 10*3/uL (ref 4.0–10.5)
nRBC: 0 % (ref 0.0–0.2)

## 2019-07-17 LAB — GLUCOSE, CAPILLARY
Glucose-Capillary: 123 mg/dL — ABNORMAL HIGH (ref 70–99)
Glucose-Capillary: 130 mg/dL — ABNORMAL HIGH (ref 70–99)
Glucose-Capillary: 159 mg/dL — ABNORMAL HIGH (ref 70–99)
Glucose-Capillary: 159 mg/dL — ABNORMAL HIGH (ref 70–99)
Glucose-Capillary: 163 mg/dL — ABNORMAL HIGH (ref 70–99)
Glucose-Capillary: 171 mg/dL — ABNORMAL HIGH (ref 70–99)

## 2019-07-17 LAB — MAGNESIUM: Magnesium: 1.7 mg/dL (ref 1.7–2.4)

## 2019-07-17 LAB — BASIC METABOLIC PANEL
Anion gap: 9 (ref 5–15)
BUN: 33 mg/dL — ABNORMAL HIGH (ref 8–23)
CO2: 26 mmol/L (ref 22–32)
Calcium: 7.8 mg/dL — ABNORMAL LOW (ref 8.9–10.3)
Chloride: 111 mmol/L (ref 98–111)
Creatinine, Ser: 0.85 mg/dL (ref 0.61–1.24)
GFR calc Af Amer: >60 mL/min (ref >60)
GFR calc non Af Amer: >60 mL/min (ref >60)
Glucose, Bld: 154 mg/dL — ABNORMAL HIGH (ref 70–99)
Potassium: 3.6 mmol/L (ref 3.5–5.1)
Sodium: 146 mmol/L — ABNORMAL HIGH (ref 135–145)

## 2019-07-17 LAB — PHOSPHORUS: Phosphorus: 3 mg/dL (ref 2.5–4.6)

## 2019-07-17 MED ORDER — FUROSEMIDE 10 MG/ML IJ SOLN
40.0000 mg | Freq: Once | INTRAMUSCULAR | Status: AC
  Administered 2019-07-17: 40 mg via INTRAVENOUS
  Filled 2019-07-17: qty 4

## 2019-07-17 MED ORDER — OSMOLITE 1.2 CAL PO LIQD: 1000.0000 mL | ORAL | Status: DC

## 2019-07-17 MED ORDER — OSMOLITE 1.2 CAL PO LIQD
1000.0000 mL | ORAL | Status: DC
  Administered 2019-07-17: 17:00:00 1000 mL
  Filled 2019-07-17 (×2): qty 1000

## 2019-07-17 MED ORDER — OSMOLITE 1.2 CAL PO LIQD
1000.0000 mL | ORAL | Status: DC
  Filled 2019-07-17: qty 1000

## 2019-07-17 NOTE — Progress Notes (Signed)
Nutrition Follow-up  DOCUMENTATION CODES:   Severe malnutrition in context of chronic illness  INTERVENTION:   Recommend increase Osmolite 1.2 to goal of 60 ml/hr via cortrak tube 30 ml Prostat daily  Provides: 1828 kcal, 94 grams protein, and 1167 ml free water.    NUTRITION DIAGNOSIS:   Severe Malnutrition related to (likely chronic illness) as evidenced by severe fat depletion, severe muscle depletion. Ongoing  GOAL:   Patient will meet greater than or equal to 90% of their needs Meeting with TF.   MONITOR:   I & O's, TF tolerance  REASON FOR ASSESSMENT:   Ventilator    ASSESSMENT:   Pt with PMH of HTN and DM admitted with acute encephalopathy.    Per MD pt admitted as code stroke and treated for szs which was negative. Developed retroperitoneal bleed. Per neuro pt's acute metabolic encephalopathy likely anoxic injury which seems to be improving. Pt now following commands. Per CCM will need trach.   2/5 cortrak placed but seemed to be clogged 2/6 small bore feeding tube placed by IR 2/8 extubated 2/9 cortrak placed, tip gastric; cardiac arrest in MRI, intubated   Patient is currently intubated on ventilator support MV: 10.9 L/min Temp (24hrs), Avg:99 F (37.2 C), Min:98.8 F (37.1 C), Max:99.3 F (37.4 C)  Medications reviewed: vitamin B12 300 ml free water every 4 hours = 1800 ml  Labs reviewed: Na 146 (H) CBG's: 163-123   TF: Osmolite @ 20 providing 576 kcal and 26 grams protein  Diet Order:   Diet Order            Diet NPO time specified  Diet effective now              EDUCATION NEEDS:   No education needs have been identified at this time  Skin:  Skin Assessment: Reviewed RN Assessment  Last BM:  2/15  Height:   Ht Readings from Last 1 Encounters:  16-Jul-2019 5\' 10"  (1.778 m)    Weight:   Wt Readings from Last 1 Encounters:  07/17/19 77.6 kg    Ideal Body Weight:  75.4 kg  BMI:  Body mass index is 24.55  kg/m.  Estimated Nutritional Needs:   Kcal:  1761  Protein:  90-120 grams  Fluid:  2 L/day  07/19/19., RD, LDN, CNSC See AMiON for contact information

## 2019-07-17 NOTE — Progress Notes (Signed)
NAME:  George Haggart, MRN:  [id number], DOB:  08/12/1934, LOS: 37 ADMISSION DATE:  22-Jul-2019, CONSULTATION DATE:  07/22/19 REFERRING MD:  Lorin Mercy, CHIEF COMPLAINT:  Encephalopathy   Brief History   85 y.o. male, goes by [name], with prior hx HTN/ DM presenting after being found on floor with acute encephalopathy.  Presented as code stroke.  Noted to have initial left gaze.  LSW 1/30 ~4 pm.  Rule out for stroke with negative CTH/ CTA.  Concern for seizure +/- toxic/ metabolic encephalopathy.    Past Medical History  HTN, DM  Significant Hospital Events   1/31 Admit 2/02 extubated 2/03 difficulty with agitation and airway protection, weak cough 2/04 reintubated early am, a flutter New onset a fib/flutter - on heparin 2/5 issues with foley leaking  Started back on propofol  2/6 stable Apneic with PSV  2/8 Extubated; PEA arrest in MRI 3 mins duration. Re-intuabted 2/9 shock developed, hemorrhagic. RP bleed  2/13 off pressors 2/17 follows commands   Consults:  Neurology Cardiology  Procedures:  1/31 ETT >> 2/02 2/04 ETT >> 2/8 2/8 ETT >> 2/10 RIJ CVL >>  Significant Diagnostic Tests:  1/31 CT head/neck >> 30% ICA stenosis on the right, 20% on the left, 30-50% narrowing in both carotid siphon regions but no correctable proximal stenosis. 1/31 EEG >> moderate diffuse encephalopathy, no epileptiform activity 2/01 brain MRI >> Moderate cerebral atrophy with chronic microvascular ischemic disease, Soft tissue edema within the right occipital/suboccipital scalp. 2/01 LTM  >> moderate diffuse encephalopathy 2/05 Echo >> LVEF 65-70%. Otherwise, unimpressive cxr 07/04/19 Bibasilar infiltrates consistent with pneumonia or aspiration, RIGHT greater than LEFT, slightly increased on RIGHT since prior study MRI 2/8 > No acute intracranial abnormality. Marked parenchymal volume loss. Moderate chronic small vessel ischemia. EEG 2/8 >>> no seizure identified EEG 2/11 > moderate diffuse  encephalopathy CT head 2/11 > acute to early subacute infarct of the left occipital lobe CT A / P 2/11 > large left RP hematoma, trace ascites, small b/l effusions with compressive atelectasis  MRI brain 2/15 >>recent Left PCA infarct, bilateral more nonspecific parieto-occipital gyral diffusion abnormality ? Anoxia/ischemia and sequelae of seizures    Micro Data:  1/31 SARS 2/ flu A/B >> neg 1/31 MRSA PCR >> negative 2/8 resp >> Few enterobacter  Antimicrobials:  Unasyn 2/02 >> 2/8 Zosyn 2/9 > 2/10 Vanco 2/9 > 2/10  Ceftriaxone 2/10 > 2/16   Interim history/subjective:   Afebrile Critically ill, appears deconditioned, intubated Good urine output with Lasix  Objective   Blood pressure (!) 116/104, pulse 87, temperature 99.1 F (37.3 C), resp. rate 15, height 5\' 10"  (1.778 m), weight 77.6 kg, SpO2 100 %.    Vent Mode: PSV;CPAP FiO2 (%):  [40 %] 40 % Set Rate:  [8 bmp] 8 bmp Vt Set:  [510 mL] 510 mL PEEP:  [5 cmH20] 5 cmH20 Pressure Support:  [10 cmH20] 10 cmH20 Plateau Pressure:  [14 cmH20-16 cmH20] 16 cmH20   Intake/Output Summary (Last 24 hours) at 07/17/2019 0906 Last data filed at 07/17/2019 0800 Gross per 24 hour  Intake 3244.27 ml  Output 3020 ml  Net 224.27 ml   Filed Weights   07/11/19 0500 07/12/19 0500 07/17/19 0446  Weight: 83 kg 78.7 kg 77.6 kg   Examination:  General: Elderly gentleman, acutely ill, on vent HEENT: Moist oral mucosa pupils reacting, oral ETT, mild pallor, no icterus Neuro: Awake, follows simple commands, decreased myoclonic jerks of both hands and shoulder CV: S1-S2 irregular PULM: Coarse  breath sounds, reduced at the bases GI: soft, bs+, condom cath  [facility]  Chest x-ray 2/16 shows bilateral lower lobe layering effusions and?  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 -Ct Keppra   PEA Cardiac arrest New onset atrial flutter Some suspicion for sick sinus syndrome with Occasional episodes of bradycardia -Not on anticoagulation secondary to recent retroperitoneal bleed -Use Lopressor or hydralazine as needed for hypertension  Dysphagia Protein calorie malnutrition -Continue tube feeds  Diabetes, type II -SSI , dc tube feed coverage -CBG upto 180 acceptable   -Hypokalemia and hypophosphatemia  repleted  Hypernatremia -improving, dc D5W, ct free water 300 every 4 -repeat Lasix 40x1 today  Shock liver , improving -Trend LFTs intermittently  Acute blood loss anemia secondary to retroperitoneal bleed -Stable, transfuse for hemoglobin 7 or lower  Resolved problems Hemorrhagic shock   Aspiration pneumonia -Enterobacter -treated with ceftriaxone  Acute kidney injury -likely ATN due to shock  Summary -initial presentation thought to be TIA or postictal state .  Each time after extubation returned to his baseline state.  Not clear what was causing  central apneas.  Encephalopathy after hemorrhagic shock due to retroperitoneal bleed seems to be related to hypoxic brain injury.  Since this has improved, seems reasonable to push forward with tracheostomy and see if he would be rehab-able.  Son [name] is having second thoughts about tracheostomy and will get back to [place] with his decision today   Best practice:  Diet: tube feeds DVT prophylaxis: SCDs only, heparin gtt on hold GI prophylaxis: PPI  Glucose control: monitor Mobility: bed Code Status: DNR Family : son, [name] updated daily 2/18  Disposition: ICU   The patient is critically  ill with multiple organ systems failure and requires high complexity decision making for assessment and support, frequent evaluation and titration of therapies, application of advanced monitoring technologies and extensive interpretation of multiple databases. Critical Care Time devoted to patient care services described in this note independent of APP/resident  time is 31 minutes.     Cyril Mourning MD. Tonny Bollman. [facility]  If no response to pager , please call [phone]   07/17/2019

## 2019-07-18 LAB — BASIC METABOLIC PANEL
Anion gap: 7 (ref 5–15)
BUN: 31 mg/dL — ABNORMAL HIGH (ref 8–23)
CO2: 29 mmol/L (ref 22–32)
Calcium: 7.8 mg/dL — ABNORMAL LOW (ref 8.9–10.3)
Chloride: 108 mmol/L (ref 98–111)
Creatinine, Ser: 0.84 mg/dL (ref 0.61–1.24)
GFR calc Af Amer: >60 mL/min (ref >60)
GFR calc non Af Amer: >60 mL/min (ref >60)
Glucose, Bld: 196 mg/dL — ABNORMAL HIGH (ref 70–99)
Potassium: 3.5 mmol/L (ref 3.5–5.1)
Sodium: 144 mmol/L (ref 135–145)

## 2019-07-18 LAB — CBC
HCT: 27.9 % — ABNORMAL LOW (ref 39.0–52.0)
Hemoglobin: 8.4 g/dL — ABNORMAL LOW (ref 13.0–17.0)
MCH: 29.5 pg (ref 26.0–34.0)
MCHC: 30.1 g/dL (ref 30.0–36.0)
MCV: 97.9 fL (ref 80.0–100.0)
Platelets: 239 10*3/uL (ref 150–400)
RBC: 2.85 MIL/uL — ABNORMAL LOW (ref 4.22–5.81)
RDW: 14.3 % (ref 11.5–15.5)
WBC: 9.3 10*3/uL (ref 4.0–10.5)
nRBC: 0 % (ref 0.0–0.2)

## 2019-07-18 LAB — GLUCOSE, CAPILLARY
Glucose-Capillary: 183 mg/dL — ABNORMAL HIGH (ref 70–99)
Glucose-Capillary: 206 mg/dL — ABNORMAL HIGH (ref 70–99)
Glucose-Capillary: 219 mg/dL — ABNORMAL HIGH (ref 70–99)
Glucose-Capillary: 219 mg/dL — ABNORMAL HIGH (ref 70–99)
Glucose-Capillary: 246 mg/dL — ABNORMAL HIGH (ref 70–99)
Glucose-Capillary: 249 mg/dL — ABNORMAL HIGH (ref 70–99)

## 2019-07-18 LAB — MAGNESIUM: Magnesium: 1.7 mg/dL (ref 1.7–2.4)

## 2019-07-18 LAB — PHOSPHORUS: Phosphorus: 3.8 mg/dL (ref 2.5–4.6)

## 2019-07-18 MED ORDER — PRO-STAT SUGAR FREE PO LIQD
30.0000 mL | Freq: Every day | ORAL | Status: DC
  Administered 2019-07-18 – 2019-07-20 (×3): 30 mL
  Filled 2019-07-18 (×3): qty 30

## 2019-07-18 MED ORDER — LEVETIRACETAM 100 MG/ML PO SOLN
500.0000 mg | Freq: Two times a day (BID) | ORAL | Status: DC
  Administered 2019-07-18 – 2019-07-22 (×9): 500 mg
  Filled 2019-07-18 (×10): qty 5

## 2019-07-18 MED ORDER — POTASSIUM CHLORIDE 20 MEQ/15ML (10%) PO SOLN
40.0000 meq | Freq: Once | ORAL | Status: AC
  Administered 2019-07-18: 40 meq
  Filled 2019-07-18: qty 30

## 2019-07-18 MED ORDER — FUROSEMIDE 10 MG/ML IJ SOLN
40.0000 mg | Freq: Once | INTRAMUSCULAR | Status: AC
  Administered 2019-07-18: 40 mg via INTRAVENOUS
  Filled 2019-07-18: qty 4

## 2019-07-18 MED ORDER — OSMOLITE 1.2 CAL PO LIQD
1000.0000 mL | ORAL | Status: DC
  Administered 2019-07-18 – 2019-07-20 (×3): 1000 mL
  Filled 2019-07-18 (×5): qty 1000

## 2019-07-18 NOTE — Progress Notes (Signed)
NAME:  Seth Boyd, MRN:  [id number], DOB:  05/22/1935, LOS: 34 ADMISSION DATE:  06/01/2019, CONSULTATION DATE:  06/12/2019 REFERRING MD:  Lorin Mercy, CHIEF COMPLAINT:  Encephalopathy   Brief History   85 y.o. male, goes by Seth Boyd, with prior hx HTN/ DM presenting after being found on floor with acute encephalopathy.  Presented as code stroke.  Noted to have initial left gaze.  LSW 1/30 ~4 pm.  Rule out for stroke with negative CTH/ CTA.  Concern for seizure +/- toxic/ metabolic encephalopathy.    Past Medical History  HTN, DM  Significant Hospital Events   1/31 Admit 2/02 extubated 2/03 difficulty with agitation and airway protection, weak cough 2/04 reintubated early am, a flutter New onset a fib/flutter - on heparin 2/5 issues with foley leaking  Started back on propofol  2/6 stable Apneic with PSV  2/8 Extubated; PEA arrest in MRI 3 mins duration. Re-intuabted 2/9 shock developed, hemorrhagic. RP bleed  2/13 off pressors 2/17 follows commands , diuresing well 2/18 Son [name] is having second thoughts about tracheostomy  Consults:  Neurology Cardiology  Procedures:  1/31 ETT >> 2/02 2/04 ETT >> 2/8 2/8 ETT >> 2/10 RIJ CVL >>  Significant Diagnostic Tests:  1/31 CT head/neck >> 30% ICA stenosis on the right, 20% on the left, 30-50% narrowing in both carotid siphon regions but no correctable proximal stenosis. 1/31 EEG >> moderate diffuse encephalopathy, no epileptiform activity 2/01 brain MRI >> Moderate cerebral atrophy with chronic microvascular ischemic disease, Soft tissue edema within the right occipital/suboccipital scalp. 2/01 LTM  >> moderate diffuse encephalopathy 2/05 Echo >> LVEF 65-70%. Otherwise, unimpressive cxr 07/04/19 Bibasilar infiltrates consistent with pneumonia or aspiration, RIGHT greater than LEFT, slightly increased on RIGHT since prior study MRI 2/8 > No acute intracranial abnormality. Marked parenchymal volume loss. Moderate chronic small vessel  ischemia. EEG 2/8 >>> no seizure identified EEG 2/11 > moderate diffuse encephalopathy CT head 2/11 > acute to early subacute infarct of the left occipital lobe CT A / P 2/11 > large left RP hematoma, trace ascites, small b/l effusions with compressive atelectasis  MRI brain 2/15 >>recent Left PCA infarct, bilateral more nonspecific parieto-occipital gyral diffusion abnormality ? Anoxia/ischemia and sequelae of seizures    Micro Data:  1/31 SARS 2/ flu A/B >> neg 1/31 MRSA PCR >> negative 2/8 resp >> Few enterobacter  Antimicrobials:  Unasyn 2/02 >> 2/8 Zosyn 2/9 > 2/10 Vanco 2/9 > 2/10  Ceftriaxone 2/10 > 2/16   Interim history/subjective:   Diuresing well with Lasix Afebrile Critically ill, intubated  Objective   Blood pressure (!) 142/59, pulse 96, temperature 99.7 F (37.6 C), resp. rate 18, height 5\' 10"  (1.778 m), weight 77.4 kg, SpO2 100 %.    Vent Mode: PSV;CPAP FiO2 (%):  [40 %] 40 % Set Rate:  [8 bmp] 8 bmp Vt Set:  [510 mL] 510 mL PEEP:  [5 cmH20] 5 cmH20 Pressure Support:  [10 cmH20] 10 cmH20 Plateau Pressure:  [13 cmH20-14 cmH20] 14 cmH20   Intake/Output Summary (Last 24 hours) at 07/18/2019 0858 Last data filed at 07/18/2019 0734 Gross per 24 hour  Intake 710.35 ml  Output 2450 ml  Net -1739.65 ml   Filed Weights   07/12/19 0500 07/17/19 0446 07/18/19 0457  Weight: 78.7 kg 77.6 kg 77.4 kg   Examination:  General: Elderly gentleman, acutely ill, very frail appearing, on vent HEENT: Moist oral mucosa, pupils reacting, oral ETT, mild pallor, no icterus Neuro: Awake, follows simple commands, hard of  hearing, no myoclonic jerks  CV: S1-S2 irregular PULM: Coarse breath sounds, reduced at the bases GI: soft, bs+, condom cath  [facility]  Chest x-ray 2/16 shows bilateral lower lobe layering effusions and?  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 -Ct Keppra, change to oral   PEA Cardiac arrest New onset atrial flutter Some suspicion for sick sinus syndrome with Occasional episodes of bradycardia -Not on anticoagulation secondary to recent retroperitoneal bleed -Use Lopressor or hydralazine as needed for hypertension  Dysphagia Protein calorie malnutrition -Continue tube feeds  Diabetes, type II -SSI , dc tube feed coverage -CBG upto 180 acceptable   -Hypokalemia and hypophosphatemia  repleted  Hypernatremia -resolved, ct free water 300 every 4 -repeat Lasix 40   Shock liver , improving -Trend LFTs intermittently  Acute blood loss anemia secondary to retroperitoneal bleed -Stable, transfuse for hemoglobin 7 or lower -No anticoagulation  Resolved problems Hemorrhagic shock   Aspiration pneumonia -Enterobacter -treated with ceftriaxone  Acute kidney injury -likely ATN due to shock  Summary -initial presentation thought to be TIA or postictal state .  Each time after extubation returned to his baseline state.  Not clear what was causing central apneas.  Encephalopathy after hemorrhagic shock due to retroperitoneal bleed seems to be related to hypoxic brain injury.  He appears very deconditioned and not clear if he would be rehab-able.  If we are to push forward he would need tracheostomy. Son is having second thoughts since [name] would never want to be in SNF situation.  He will discuss some more with his brother and call us back    Best practice:  Diet: tube feeds DVT prophylaxis: SCDs only, heparin gtt on hold GI prophylaxis: PPI  Glucose control: monitor Mobility: bed Code Status:  DNR Family : son, [name] updated daily 2/19  Disposition: ICU   The patient is critically ill with multiple organ systems failure and requires high complexity decision making for assessment and support, frequent evaluation and titration of therapies, application of advanced monitoring technologies and extensive interpretation of multiple databases. Critical Care Time devoted to patient care services described in this note independent of APP/resident  time is 32 minutes.      Cyril Mourning MD. Tonny Bollman. [facility]  If no response to pager , please call [phone]   07/18/2019

## 2019-07-19 LAB — CBC
HCT: 26.1 % — ABNORMAL LOW (ref 39.0–52.0)
Hemoglobin: 8 g/dL — ABNORMAL LOW (ref 13.0–17.0)
MCH: 29.6 pg (ref 26.0–34.0)
MCHC: 30.7 g/dL (ref 30.0–36.0)
MCV: 96.7 fL (ref 80.0–100.0)
Platelets: 210 10*3/uL (ref 150–400)
RBC: 2.7 MIL/uL — ABNORMAL LOW (ref 4.22–5.81)
RDW: 14.3 % (ref 11.5–15.5)
WBC: 9.8 10*3/uL (ref 4.0–10.5)
nRBC: 0 % (ref 0.0–0.2)

## 2019-07-19 LAB — GLUCOSE, CAPILLARY
Glucose-Capillary: 190 mg/dL — ABNORMAL HIGH (ref 70–99)
Glucose-Capillary: 196 mg/dL — ABNORMAL HIGH (ref 70–99)
Glucose-Capillary: 204 mg/dL — ABNORMAL HIGH (ref 70–99)
Glucose-Capillary: 211 mg/dL — ABNORMAL HIGH (ref 70–99)
Glucose-Capillary: 222 mg/dL — ABNORMAL HIGH (ref 70–99)
Glucose-Capillary: 233 mg/dL — ABNORMAL HIGH (ref 70–99)

## 2019-07-19 LAB — MAGNESIUM: Magnesium: 1.6 mg/dL — ABNORMAL LOW (ref 1.7–2.4)

## 2019-07-19 LAB — BASIC METABOLIC PANEL
Anion gap: 7 (ref 5–15)
BUN: 30 mg/dL — ABNORMAL HIGH (ref 8–23)
CO2: 30 mmol/L (ref 22–32)
Calcium: 7.8 mg/dL — ABNORMAL LOW (ref 8.9–10.3)
Chloride: 106 mmol/L (ref 98–111)
Creatinine, Ser: 0.89 mg/dL (ref 0.61–1.24)
GFR calc Af Amer: >60 mL/min (ref >60)
GFR calc non Af Amer: >60 mL/min (ref >60)
Glucose, Bld: 212 mg/dL — ABNORMAL HIGH (ref 70–99)
Potassium: 3.6 mmol/L (ref 3.5–5.1)
Sodium: 143 mmol/L (ref 135–145)

## 2019-07-19 LAB — PHOSPHORUS: Phosphorus: 2.7 mg/dL (ref 2.5–4.6)

## 2019-07-19 MED ORDER — POTASSIUM CHLORIDE 20 MEQ/15ML (10%) PO SOLN
40.0000 meq | Freq: Once | ORAL | Status: AC
  Administered 2019-07-19: 40 meq
  Filled 2019-07-19: qty 30

## 2019-07-19 MED ORDER — MAGNESIUM SULFATE 2 GM/50ML IV SOLN
2.0000 g | Freq: Once | INTRAVENOUS | Status: AC
  Administered 2019-07-19: 2 g via INTRAVENOUS
  Filled 2019-07-19: qty 50

## 2019-07-19 NOTE — Progress Notes (Signed)
NAME:  Seth Boyd, MRN:  [id number], DOB:  [birth year], LOS: 100 ADMISSION DATE:  06/28/2019, CONSULTATION DATE:  06/12/2019 REFERRING MD:  Seth Boyd, CHIEF COMPLAINT:  Encephalopathy   Brief History   85 y.o. male, goes by Seth Boyd, with prior hx HTN/ DM presenting after being found on floor with acute encephalopathy.  Presented as code stroke.  Noted to have initial left gaze.  LSW 1/30 ~4 pm.  Rule out for stroke with negative CTH/ CTA.  Concern for seizure +/- toxic/ metabolic encephalopathy.    Past Medical History  HTN, DM  Significant Hospital Events   1/31 Admit 2/02 extubated 2/03 difficulty with agitation and airway protection, weak cough 2/04 reintubated early am, a flutter New onset a fib/flutter - on heparin 2/5 issues with foley leaking  Started back on propofol  2/6 stable Apneic with PSV  2/8 Extubated; PEA arrest in MRI 3 mins duration. Re-intuabted 2/9 shock developed, hemorrhagic. RP bleed  2/13 off pressors 2/17 follows commands , diuresing well 2/18 Son Seth Boyd is having second thoughts about tracheostomy  Consults:  Neurology Cardiology  Procedures:  1/31 ETT >> 2/02 2/04 ETT >> 2/8 2/8 ETT >> 2/10 RIJ CVL >>  Significant Diagnostic Tests:  1/31 CT head/neck >> 30% ICA stenosis on the right, 20% on the left, 30-50% narrowing in both carotid siphon regions but no correctable proximal stenosis. 1/31 EEG >> moderate diffuse encephalopathy, no epileptiform activity 2/01 brain MRI >> Moderate cerebral atrophy with chronic microvascular ischemic disease, Soft tissue edema within the right occipital/suboccipital scalp. 2/01 LTM  >> moderate diffuse encephalopathy 2/05 Echo >> LVEF 65-70%. Otherwise, unimpressive cxr 07/04/19 Bibasilar infiltrates consistent with pneumonia or aspiration, RIGHT greater than LEFT, slightly increased on RIGHT since prior study MRI 2/8 > No acute intracranial abnormality. Marked parenchymal volume loss. Moderate chronic small vessel  ischemia. EEG 2/8 >>> no seizure identified EEG 2/11 > moderate diffuse encephalopathy CT head 2/11 > acute to early subacute infarct of the left occipital lobe CT A / P 2/11 > large left RP hematoma, trace ascites, small b/l effusions with compressive atelectasis  MRI brain 2/15 >>recent Left PCA infarct, bilateral more nonspecific parieto-occipital gyral diffusion abnormality ? Anoxia/ischemia and sequelae of seizures    Micro Data:  1/31 SARS 2/ flu A/B >> neg 1/31 MRSA PCR >> negative 2/8 resp >> Few enterobacter  Antimicrobials:  Unasyn 2/02 >> 2/8 Zosyn 2/9 > 2/10 Vanco 2/9 > 2/10  Ceftriaxone 2/10 > 2/16   Interim history/subjective:   Appears weak and deconditioned Critically ill, intubated Low-grade febrile Good urine output with Lasix  Objective   Blood pressure (!) 158/61, pulse 97, temperature 100.2 F (37.9 C), resp. rate 19, height 5\' 10"  (1.778 m), weight 77.9 kg, SpO2 100 %.    Vent Mode: PSV;CPAP FiO2 (%):  [40 %] 40 % Set Rate:  [8 bmp] 8 bmp Vt Set:  [510 mL] 510 mL PEEP:  [5 cmH20] 5 cmH20 Pressure Support:  [10 cmH20] 10 cmH20   Intake/Output Summary (Last 24 hours) at 07/19/2019 1054 Last data filed at 07/19/2019 0800 Gross per 24 hour  Intake 4980 ml  Output 2260 ml  Net 2720 ml   Filed Weights   07/17/19 0446 07/18/19 0457 07/19/19 0500  Weight: 77.6 kg 77.4 kg 77.9 kg   Examination:  General: Elderly gentleman, acutely ill, very frail appearing, on vent HEENT: Moist oral mucosa, pupils reacting, oral ETT, mild pallor, no icterus Neuro: Awake, did not follow commands to me today,  hard of hearing, no myoclonic jerks  CV: S1-S2 irregular PULM: Coarse breath sounds, reduced at the bases Musculoskeletal-cool right foot, left foot warm, good pulses GI: soft, bs+, condom cath  [facility]  Chest x-ray 2/16 shows bilateral lower lobe layering effusions and?  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  Embolic but cannot anticoagulate-we will not pursue further testing  Dysphagia Protein calorie malnutrition -Continue tube feeds  Diabetes, type II -SSI , add 5 units Levemir -CBG upto 180 acceptable   -Hypokalemia and hypomagnesemia repleted Hypernatremia -resolved, ct free water 300 every 4 -repeat Lasix 40   Shock liver , improving -Trend LFTs intermittently  Acute blood loss anemia secondary to retroperitoneal bleed -Stable, transfuse for hemoglobin 7 or lower -No anticoagulation  Resolved problems Hemorrhagic shock   Aspiration pneumonia -Enterobacter -treated with ceftriaxone  Acute kidney injury -likely ATN due to shock  Summary -initial presentation thought to be TIA or postictal state .  Each time after extubation returned to his baseline state.  Not clear what was causing central apneas.  Encephalopathy after hemorrhagic shock due to retroperitoneal bleed seems to be related to hypoxic brain injury.  He appears very deconditioned and not clear if he would be rehab-able.  If we are to push forward he would need tracheostomy. Discussed with son Seth Boyd, they are leaning towards comfort care, he needs to discuss more with his brother before confirming this    Best  practice:  Diet: tube feeds DVT prophylaxis: SCDs only, heparin gtt on hold GI prophylaxis: PPI  Glucose control: monitor Mobility: bed Code Status: DNR Family : son, Seth Boyd updated daily 2/20  Disposition: ICU   The patient is critically ill with multiple organ systems failure and requires high complexity decision making for assessment and support, frequent evaluation and titration of therapies, application of advanced monitoring technologies and extensive interpretation of multiple databases. Critical Care Time devoted to patient care services described in this note independent of APP/resident  time is 31 minutes.       Cyril Mourning MD. Tonny Bollman. [facility]  If no response to pager , please call [phone]   07/19/2019

## 2019-07-19 NOTE — Progress Notes (Signed)
Subjective: May be slightly less responsive than 3 days ago    Exam: Vitals:   07/19/19 0900 07/19/19 1027  BP: (!) 159/65 (!) 158/61  Pulse: 97   Resp: 19   Temp: 100.2 F (37.9 C)   SpO2: 100%    Gen: In bed, NAD Resp: non-labored breathing, no acute distress Abd: soft, nt  Neuro: MS: Eyes open spontaneously.  When asked to stick out his tongue, he opens his mouth.  He does not follow command to show thumbs or wiggle toes CN: Pupils equal round reactive, corneals are intact, fixates and tracks across midline in both directions Motor: withdrawal in all 4 extremities.  His abnormal movements have improved. action myoclonus Sensory: Response to neck stimulation x4  Impression: 85 year old male with very complicated hospital course.  His initial presentation could have been TIA or postictal state, but he initially returned to essentially baseline status.  I am uncertain what could have been causing his apneic episodes.  It was after his hemorrhagic shock/arrest that he has been more encephalopathic and MRI shows changes that are most consistent with hypoxic brain injury.   Given that he is showing signs of improvement, I think that there is a chance this improvement could continue, though there is no guarantee of a good outcome.  It is still unclear to me if he needs long term AED therapy, but Keppra could be helpful for the myoclonus as well.  I am not sure I would aggressively target the abnormal movements unless it is interfering with his rehab.  At this point, I think supportive care is the mainstay of therapy.  Any recovery that he has will be over the intermediate to long-term, and may be incomplete.  Family discussions are ongoing as to what his wishes would be in this type of situation.  Recommendations: 1) continue Keppra 2) neurology will be available as needed moving forward, please call with further questions or concerns.   Ritta Slot, MD [facility] [phone]  If 7pm- 7am, please page neurology on call as listed in AMION.

## 2019-07-20 LAB — GLUCOSE, CAPILLARY
Glucose-Capillary: 195 mg/dL — ABNORMAL HIGH (ref 70–99)
Glucose-Capillary: 207 mg/dL — ABNORMAL HIGH (ref 70–99)
Glucose-Capillary: 222 mg/dL — ABNORMAL HIGH (ref 70–99)
Glucose-Capillary: 223 mg/dL — ABNORMAL HIGH (ref 70–99)
Glucose-Capillary: 242 mg/dL — ABNORMAL HIGH (ref 70–99)
Glucose-Capillary: 263 mg/dL — ABNORMAL HIGH (ref 70–99)

## 2019-07-20 NOTE — Progress Notes (Signed)
NAME:  Seth Boyd, MRN:  [id number], DOB:  07/27/1934, LOS: 21 ADMISSION DATE:  06/06/2019, CONSULTATION DATE:  06/12/2019 REFERRING MD:  Ophelia Charter, CHIEF COMPLAINT:  Encephalopathy   Brief History   85 y.o. male, goes by Seth Boyd, with prior hx HTN/ DM presenting after being found on floor with acute encephalopathy.  Presented as code stroke.  Noted to have initial left gaze.  LSW 1/30 ~4 pm.  Rule out for stroke with negative CTH/ CTA.  Concern for seizure +/- toxic/ metabolic encephalopathy.    Past Medical History  HTN, DM  Significant Hospital Events   1/31 Admit 2/02 extubated 2/03 difficulty with agitation and airway protection, weak cough 2/04 reintubated early am, a flutter New onset a fib/flutter - on heparin 2/5 issues with foley leaking  Started back on propofol  2/6 stable Apneic with PSV  2/8 Extubated; PEA arrest in MRI 3 mins duration. Re-intuabted 2/9 shock developed, hemorrhagic. RP bleed  2/13 off pressors 2/17 follows commands , diuresing well 2/18 Son [name] is having second thoughts about tracheostomy  Consults:  Neurology Cardiology  Procedures:  1/31 ETT >> 2/02 2/04 ETT >> 2/8 2/8 ETT >> 2/10 RIJ CVL >>  Significant Diagnostic Tests:  1/31 CT head/neck >> 30% ICA stenosis on the right, 20% on the left, 30-50% narrowing in both carotid siphon regions but no correctable proximal stenosis. 1/31 EEG >> moderate diffuse encephalopathy, no epileptiform activity 2/01 brain MRI >> Moderate cerebral atrophy with chronic microvascular ischemic disease, Soft tissue edema within the right occipital/suboccipital scalp. 2/01 LTM  >> moderate diffuse encephalopathy 2/05 Echo >> LVEF 65-70%. Otherwise, unimpressive cxr 07/04/19 Bibasilar infiltrates consistent with pneumonia or aspiration, RIGHT greater than LEFT, slightly increased on RIGHT since prior study MRI 2/8 > No acute intracranial abnormality. Marked parenchymal volume loss. Moderate chronic small vessel  ischemia. EEG 2/8 >>> no seizure identified EEG 2/11 > moderate diffuse encephalopathy CT head 2/11 > acute to early subacute infarct of the left occipital lobe CT A / P 2/11 > large left RP hematoma, trace ascites, small b/l effusions with compressive atelectasis  MRI brain 2/15 >>recent Left PCA infarct, bilateral more nonspecific parieto-occipital gyral diffusion abnormality ? Anoxia/ischemia and sequelae of seizures    Micro Data:  1/31 SARS 2/ flu A/B >> neg 1/31 MRSA PCR >> negative 2/8 resp >> Few enterobacter  Antimicrobials:  Unasyn 2/02 >> 2/8 Zosyn 2/9 > 2/10 Vanco 2/9 > 2/10  Ceftriaxone 2/10 > 2/16   Interim history/subjective:   Appears weak and deconditioned Critically ill, intubated Low-grade febrile Good urine output with Lasix  Objective   Blood pressure (!) 158/61, pulse 92, temperature 99.5 F (37.5 C), resp. rate 17, height 5\' 10"  (1.778 m), weight 78.5 kg, SpO2 100 %.    Vent Mode: PSV;CPAP FiO2 (%):  [40 %] 40 % Set Rate:  [8 bmp] 8 bmp Vt Set:  [510 mL] 510 mL PEEP:  [5 cmH20] 5 cmH20 Pressure Support:  [10 cmH20] 10 cmH20 Plateau Pressure:  [13 cmH20-14 cmH20] 14 cmH20   Intake/Output Summary (Last 24 hours) at 07/20/2019 0904 Last data filed at 07/20/2019 0600 Gross per 24 hour  Intake 1064.21 ml  Output 1465 ml  Net -400.79 ml   Filed Weights   07/18/19 0457 07/19/19 0500 07/20/19 0432  Weight: 77.4 kg 77.9 kg 78.5 kg   Examination: Not much change today General: Elderly gentleman, acutely ill, very frail appearing, on vent HEENT: Moist oral mucosa, pupils reacting, oral ETT, mild pallor,  no icterus Neuro: Eyes closed, did not follow commands, no myoclonic jerks CV: S1-S2 irregular PULM: Coarse breath sounds, reduced at the bases Musculoskeletal-cool right foot, left foot warm, good pulses GI: soft, bs+, condom cath  [facility]  Chest x-ray 2/16 shows bilateral lower lobe layering effusions and?  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  Embolic but cannot anticoagulate-we will not pursue further testing  -will leave central line in for now since planning on terminal extubation  Dysphagia Protein calorie malnutrition -Continue tube feeds  Diabetes, type II -SSI , add 5 units Levemir -high CBGs okay at this point   -Hypokalemia and hypomagnesemia repleted Hypernatremia -resolved, ct free water 300 every 4 -repeat Lasix 40   Shock liver , improving -Trend LFTs intermittently  Acute blood loss anemia secondary to retroperitoneal bleed -Stable, transfuse for hemoglobin 7 or lower -No anticoagulation  Resolved problems Hemorrhagic shock   Aspiration pneumonia -Enterobacter -treated with ceftriaxone  Acute kidney injury -likely ATN due to shock  Summary -initial presentation thought to be TIA or postictal state .  Each time after extubation returned to his baseline state.  Not clear what was causing central apneas.  Encephalopathy after hemorrhagic shock due to retroperitoneal bleed seems to be related to hypoxic brain injury.  He appears very deconditioned and not clear if he would  be rehab-able.   Per son [name] on 2/21, did not want to pursue tracheostomy and would favor one-way extubation to comfort.  They plan to do this on 2/21    Best practice:  Diet: tube feeds DVT prophylaxis: SCDs only, heparin gtt on hold GI prophylaxis: PPI  Glucose control: monitor Mobility: bed Code Status: DNR Family : son, [name] updated daily 2/21 Disposition: ICU   The patient is critically ill with multiple organ systems failure and requires high complexity decision making for assessment and support, frequent evaluation and titration of therapies, application of advanced monitoring technologies and extensive interpretation of multiple databases. Critical Care Time devoted to patient care services described in this note independent of APP/resident  time is 31 minutes.       Kara Mead MD. Shade Flood. [place]  If no response to pager , please call [phone]   07/20/2019

## 2019-07-21 LAB — GLUCOSE, CAPILLARY
Glucose-Capillary: 135 mg/dL — ABNORMAL HIGH (ref 70–99)
Glucose-Capillary: 144 mg/dL — ABNORMAL HIGH (ref 70–99)
Glucose-Capillary: 155 mg/dL — ABNORMAL HIGH (ref 70–99)
Glucose-Capillary: 182 mg/dL — ABNORMAL HIGH (ref 70–99)
Glucose-Capillary: 200 mg/dL — ABNORMAL HIGH (ref 70–99)
Glucose-Capillary: 231 mg/dL — ABNORMAL HIGH (ref 70–99)

## 2019-07-21 MED ORDER — POTASSIUM CHLORIDE 20 MEQ/15ML (10%) PO SOLN
40.0000 meq | Freq: Once | ORAL | Status: AC
  Administered 2019-07-21: 40 meq
  Filled 2019-07-21: qty 30

## 2019-07-21 MED ORDER — FUROSEMIDE 10 MG/ML IJ SOLN
40.0000 mg | Freq: Once | INTRAMUSCULAR | Status: AC
  Administered 2019-07-21: 40 mg via INTRAVENOUS
  Filled 2019-07-21: qty 4

## 2019-07-21 NOTE — Procedures (Signed)
Extubation Procedure Note  Patient Details:   Name: Seth Boyd DOB: 01-May-1935 MRN: [id number]   Airway Documentation:  Airway 7.5 mm (Active)  Secured at (cm) 24 cm 07/21/19 0820  Measured From Lips 07/21/19 0820  Secured Location Center 07/21/19 0820  Secured By [employer] 07/21/19 0820  Tube Holder Repositioned Yes 07/21/19 0820  Cuff Pressure (cm H2O) 26 cm H2O 07/21/19 0820  Site Condition Dry 07/21/19 0820   Vent end date: 07/07/19 Vent end time: 1056   Evaluation  O2 sats: stable throughout Complications: No apparent complications Patient did tolerate procedure well. Bilateral Breath Sounds: Clear, Diminished   No   Patient was extubated to a 4L [place]. Cuff leak was heard. No stridor was noted. RN at bedside with RT during extubation. Patient unable to speak at this time. Dr Delton Coombes is aware.  Danella Maiers [facility] 07/21/2019, 10:52 AM

## 2019-07-21 NOTE — Progress Notes (Signed)
NAME:  Seth Boyd, MRN:  [id number], DOB:  Jan 30, 1935, LOS: 22 ADMISSION DATE:  07-04-19, CONSULTATION DATE:  July 04, 2019 REFERRING MD:  Ophelia Charter, CHIEF COMPLAINT:  Encephalopathy   Brief History   85 y.o. male, goes by Seth Boyd, with prior hx HTN/ DM presenting after being found on floor with acute encephalopathy.  Presented as code stroke.  Noted to have initial left gaze.  LSW 1/30 ~4 pm.  Rule out for stroke with negative CTH/ CTA.  Concern for seizure +/- toxic/ metabolic encephalopathy.    Past Medical History  HTN, DM  Significant Hospital Events   1/31 Admit 2/02 extubated 2/03 difficulty with agitation and airway protection, weak cough 2/04 reintubated early am, a flutter New onset a fib/flutter - on heparin 2/5 issues with foley leaking  Started back on propofol  2/6 stable Apneic with PSV  2/8 Extubated; PEA arrest in MRI 3 mins duration. Re-intuabted 2/9 shock developed, hemorrhagic. RP bleed  2/13 off pressors 2/17 follows commands , diuresing well 2/18 Son [name] is having second thoughts about tracheostomy 2/21 sons formally decided against trach  Consults:  Neurology Cardiology  Procedures:  1/31 ETT >> 2/02 2/04 ETT >> 2/8 2/8 ETT >> 2/10 RIJ CVL >>  Significant Diagnostic Tests:  1/31 CT head/neck >> 30% ICA stenosis on the right, 20% on the left, 30-50% narrowing in both carotid siphon regions but no correctable proximal stenosis. 1/31 EEG >> moderate diffuse encephalopathy, no epileptiform activity 2/01 brain MRI >> Moderate cerebral atrophy with chronic microvascular ischemic disease, Soft tissue edema within the right occipital/suboccipital scalp. 2/01 LTM  >> moderate diffuse encephalopathy 2/05 Echo >> LVEF 65-70%. Otherwise, unimpressive cxr 07/04/19 Bibasilar infiltrates consistent with pneumonia or aspiration, RIGHT greater than LEFT, slightly increased on RIGHT since prior study MRI 2/8 > No acute intracranial abnormality. Marked parenchymal volume  loss. Moderate chronic small vessel ischemia. EEG 2/8 >>> no seizure identified EEG 2/11 > moderate diffuse encephalopathy CT head 2/11 > acute to early subacute infarct of the left occipital lobe CT A / P 2/11 > large left RP hematoma, trace ascites, small b/l effusions with compressive atelectasis  MRI brain 2/15 >>recent Left PCA infarct, bilateral more nonspecific parieto-occipital gyral diffusion abnormality ? Anoxia/ischemia and sequelae of seizures    Micro Data:  1/31 SARS 2/ flu A/B >> neg 1/31 MRSA PCR >> negative 2/8 resp >> Few enterobacter  Antimicrobials:  Unasyn 2/02 >> 2/8 Zosyn 2/9 > 2/10 Vanco 2/9 > 2/10  Ceftriaxone 2/10 > 2/16   Interim history/subjective:  No sig issues overnight. VTs excellent on PSV  Objective   Blood pressure (Abnormal) 145/53, pulse 99, temperature 97.6 F (36.4 C), resp. rate (Abnormal) 23, height 5\' 10"  (1.778 m), weight 76.2 kg, SpO2 100 %.    Vent Mode: PSV;CPAP FiO2 (%):  [30 %-40 %] 40 % Set Rate:  [8 bmp] 8 bmp Vt Set:  [510 mL] 510 mL PEEP:  [5 cmH20] 5 cmH20 Pressure Support:  [5 cmH20-10 cmH20] 5 cmH20 Plateau Pressure:  [11 cmH20-15 cmH20] 15 cmH20   Intake/Output Summary (Last 24 hours) at 07/21/2019 0841 Last data filed at 07/21/2019 0700 Gross per 24 hour  Intake 3356 ml  Output 1925 ml  Net 1431 ml   Filed Weights   07/19/19 0500 07/20/19 0432 07/21/19 0430  Weight: 77.9 kg 78.5 kg 76.2 kg   Examination:  General deconditioned white male on vent. No distress. Not interactive  HENT NCAT no JVDS MMM orally intubated Pulm scattered rhonchi.  Vts mid 400s on PSV 5 Card RRR abd not tender  Ext diffuse anasarca. Pulses palp  Neuro moves lowers (flickers at best), no follow commands. No movement of uppers. Opens eyes but no real attempt to communicate.  gu cl yellow  Resolved issues   Hemorrhagic shock  Aspiration pneumonia -Enterobacter -treated with ceftriaxone Acute kidney injury -likely ATN due to shock PEA  arrest  Hypernatremia   Assessment & Plan:   Summary -initial presentation thought to be TIA or postictal state .  Each time after extubation returned to his baseline state.  Not clear what was causing central apneas.  Encephalopathy after hemorrhagic shock due to retroperitoneal bleed seems to be related to hypoxic brain injury.  He appears very deconditioned and not clear if he would be rehab-able.   Per son [name] on 2/21, did not want to pursue tracheostomy and would favor one-way extubation to comfort.  They plan to do this on 2/21   Acute hypoxic respiratory failure with compromised airway Concern for central apneas -Tolerating pressure support trials -Sons decided against tracheostomy Plan One-way extubation  VAP bundle Lasix X 1 (he is over 14 liters positive) Aspiration precautions No-reintubation if fails   Acute metabolic encephalopathy: 2/2 anoxic injury and further c/b left occipital infarct (presumed embolic) -initially felt to be postictal, but  MRI showing signs of anoxic injury-improving each day -No seizures on LTM EEG x 2 Plan Cont keppra   New onset atrial flutter Some suspicion for sick sinus syndrome with Occasional episodes of bradycardia -Not on anticoagulation secondary to recent retroperitoneal bleed Plan Cont tele  rx htn w/ prn lopressor and hydralazine   Possibly ischemic right foot?  Embolic but cannot anticoagulate-we will not pursue further testing  Plan Monitor   Dysphagia Protein calorie malnutrition Plan Tube feeds  Diabetes, type II ->still hyperglycemic Plan Cont current ssi and basal dosing. Will not increase as once extubated will hold tubefeeds.   Intermittent fluid and electrolyte imbalance Plan Trending intermittently   Shock liver , improving Plan Trend intermittently   Acute blood loss anemia secondary to retroperitoneal bleed Plan Trend transfuse for hgb < 7    Best practice:  Diet: tube feeds DVT prophylaxis:  SCDs only, heparin gtt on hold GI prophylaxis: PPI  Glucose control: monitor Mobility: bed Code Status: DNR Family : son, [name] updated daily 2/21 Disposition:   Erick Colace ACNP-BC [place] Pager # [phone] OR # [phone] if no answer

## 2019-07-22 LAB — GLUCOSE, CAPILLARY: Glucose-Capillary: 133 mg/dL — ABNORMAL HIGH (ref 70–99)

## 2019-07-22 MED ORDER — MORPHINE SULFATE (PF) 2 MG/ML IV SOLN
2.0000 mg | INTRAVENOUS | Status: DC | PRN
  Administered 2019-07-22 (×2): 2 mg via INTRAVENOUS
  Administered 2019-07-23: 4 mg via INTRAVENOUS
  Filled 2019-07-22: qty 2
  Filled 2019-07-22 (×2): qty 1

## 2019-07-22 MED ORDER — LEVETIRACETAM IN NACL 500 MG/100ML IV SOLN
500.0000 mg | Freq: Two times a day (BID) | INTRAVENOUS | Status: DC
  Administered 2019-07-22 – 2019-07-23 (×2): 500 mg via INTRAVENOUS
  Filled 2019-07-22 (×2): qty 100

## 2019-07-22 NOTE — Progress Notes (Signed)
Nutrition Brief Note  Chart reviewed. Pt had a one-way extubation 2/22. Per MD no improvement now transitioning to comfort care.  No further nutrition interventions warranted at this time.  Please re-consult as needed.   Cammy Copa., RD, LDN, CNSC See AMiON for contact information

## 2019-07-22 NOTE — Progress Notes (Signed)
NAME:  Seth Boyd, MRN:  [id number], DOB:  [birth year], LOS: 75 ADMISSION DATE:  07/20/2019, CONSULTATION DATE:  July 20, 2019 REFERRING MD:  Lorin Mercy, CHIEF COMPLAINT:  Encephalopathy   Brief History   85 y.o. male, goes by Seth Boyd, with prior hx HTN/ DM presenting after being found on floor with acute encephalopathy.  Presented as code stroke.  Noted to have initial left gaze.  LSW 1/30 ~4 pm.  Rule out for stroke with negative CTH/ CTA.  Concern for seizure +/- toxic/ metabolic encephalopathy.    Past Medical History  HTN, DM  Significant Hospital Events   1/31 Admit 2/02 extubated 2/03 difficulty with agitation and airway protection, weak cough 2/04 reintubated early am, a flutter New onset a fib/flutter - on heparin 2/5 issues with foley leaking  Started back on propofol  2/6 stable Apneic with PSV  2/8 Extubated; PEA arrest in MRI 3 mins duration. Re-intuabted 2/9 shock developed, hemorrhagic. RP bleed  2/13 off pressors 2/17 follows commands , diuresing well 2/18 Son [name] is having second thoughts about tracheostomy 2/21 sons formally decided against trach  2/22 extubated 2/23 transition to full comfort.  Consults:  Neurology Cardiology  Procedures:  1/31 ETT >> 2/02 2/04 ETT >> 2/8 2/8 ETT >> 2/10 RIJ CVL >>  Significant Diagnostic Tests:  1/31 CT head/neck >> 30% ICA stenosis on the right, 20% on the left, 30-50% narrowing in both carotid siphon regions but no correctable proximal stenosis. 1/31 EEG >> moderate diffuse encephalopathy, no epileptiform activity 2/01 brain MRI >> Moderate cerebral atrophy with chronic microvascular ischemic disease, Soft tissue edema within the right occipital/suboccipital scalp. 2/01 LTM  >> moderate diffuse encephalopathy 2/05 Echo >> LVEF 65-70%. Otherwise, unimpressive cxr 07/04/19 Bibasilar infiltrates consistent with pneumonia or aspiration, RIGHT greater than LEFT, slightly increased on RIGHT since prior study MRI 2/8 > No acute  intracranial abnormality. Marked parenchymal volume loss. Moderate chronic small vessel ischemia. EEG 2/8 >>> no seizure identified EEG 2/11 > moderate diffuse encephalopathy CT head 2/11 > acute to early subacute infarct of the left occipital lobe CT A / P 2/11 > large left RP hematoma, trace ascites, small b/l effusions with compressive atelectasis  MRI brain 2/15 >>recent Left PCA infarct, bilateral more nonspecific parieto-occipital gyral diffusion abnormality ? Anoxia/ischemia and sequelae of seizures    Micro Data:  1/31 SARS 2/ flu A/B >> neg 1/31 MRSA PCR >> negative 2/8 resp >> Few enterobacter  Antimicrobials:  Unasyn 2/02 >> 2/8 Zosyn 2/9 > 2/10 Vanco 2/9 > 2/10  Ceftriaxone 2/10 > 2/16   Interim history/subjective:  Appears comfortable   Objective   Blood pressure (Abnormal) 123/56, pulse (Abnormal) 106, temperature 98.8 F (37.1 C), resp. rate (Abnormal) 22, height 5\' 10"  (1.778 m), weight 76.8 kg, SpO2 100 %.    Vent Mode: PSV;CPAP FiO2 (%):  [40 %] 40 % PEEP:  [5 cmH20] 5 cmH20 Pressure Support:  [5 cmH20] 5 cmH20   Intake/Output Summary (Last 24 hours) at 07/22/2019 0816 Last data filed at 07/21/2019 1800 Gross per 24 hour  Intake 1320 ml  Output 2100 ml  Net -780 ml   Filed Weights   07/20/19 0432 07/21/19 0430 07/22/19 0500  Weight: 78.5 kg 76.2 kg 76.8 kg   Examination:  General this is a 85 year old white male. Unresponsive in bed. Appears comfortable currently  HENT NCAT MM dry pupil ER pulm equal. Not labored. Occ rhonchi  Card RRR abd not tender + bowel sounds Ext warm and dry  Neuro unresponsive.   Resolved issues   Hemorrhagic shock  Aspiration pneumonia -Enterobacter -treated with ceftriaxone Acute kidney injury -likely ATN due to shock PEA arrest  Hypernatremia   Assessment & Plan:     Acute hypoxic respiratory failure with compromised airway Concern for central apneas Acute metabolic encephalopathy: 2/2 anoxic injury and  further c/b left occipital infarct (presumed embolic) New onset atrial flutter Possibly ischemic right foot?  Embolic but cannot anticoagulate-we will not pursue further  Dysphagia Diabetes, type II Intermittent fluid and electrolyte imbalance Shock liver , improving Acute blood loss anemia secondary to retroperitoneal bleed   Discussion Summary -initial presentation thought to be TIA or postictal state .  Each time after extubation returned to his baseline state.  Not clear what was causing central apneas.  Encephalopathy after hemorrhagic shock due to retroperitoneal bleed seems to be related to hypoxic brain injury.  We proceeded w/ one way extubation on 2/22. Thus far he seems to be comfortable. He did desaturate last night. I think it's time we focus on comfort.   Plan Will dc add PRN morphine Dc all meds that don't contribute to comfort Dc tele and transfer to floor   Best practice:  Diet: tube feeds DVT prophylaxis: SCDs only, heparin gtt on hold GI prophylaxis: PPI  Glucose control: monitor Mobility: bed Code Status: DNR Family : son, [name] updated daily 2/21 Disposition:   Simonne Martinet ACNP-BC [facility] Pulmonary/Critical Care Pager # [phone] OR # [phone] if no answer

## 2019-07-22 NOTE — Progress Notes (Signed)
Received from ICU, patient responsive to pain, comfort care continous. Family welcomed to the floor, oriented to staff. Will continue to monitor.

## 2019-07-22 NOTE — Social Work (Signed)
CSW acknowledging pt arrival to [facility] under comfort care. Will follow for disposition should hospice services or residential hospice become appropriate.   Doy Hutching, MSW, LCSW [place] Clinical Social Work

## 2019-07-22 NOTE — Progress Notes (Signed)
eLink Physician-Brief Progress Note Patient Name: Seth Boyd DOB: 1934-10-11 MRN: [id number]   Date of Service  07/22/2019  HPI/Events of Note  Patient is on Keppra per tube. However, patient has no gastric tube.   eICU Interventions  Will order: 1. D/C Keppra per tube.  2. Keppra 500 mg IV Q 12 hours.      Intervention Category Major Interventions: Other:  [name] 07/22/2019, 9:42 PM

## 2019-07-28 NOTE — Progress Notes (Signed)
TRH brief Progress Note  This patient had been transferred from [facility] to [facility] beginning today.  He had been transitioned to full comfort care. I rounded on the patient this morning.  He was unresponsive, tachypneic, mouth breathing but was not in any discomfort or pain.  I discussed with patient's RN and there were no acute issues reported.  Physical exam:  Elderly chronically ill looking male.  Mouth breathing and tachypneic.  Unresponsive.  Both eyes partially open but not tracking.  Respiratory system: Reduced breath sounds bilaterally. Cardiovascular system: S1 and S2 heard, regular tachycardic.  No JVD, murmurs. CNS: Unresponsive. Extremities: 2+ pitting edema of all limbs/anasarca.  A/P  85 year old male with PMH of HTN, DM, admitted with suspected seizures versus TIA with associated acute encephalopathy and acute hypoxic respiratory failure.  Failed extubation on 2/4 and 2/8 due to apnea.  Sustained PEA arrest on second extubation attempt with ROSC after 3 minutes.  Hospital course complicated by hemorrhagic shock due to retroperitoneal bleed, sepsis due to Enterobacter pneumonia.  Patient's mental status apparently did not improve, remained poorly responsive and encephalopathic, suspected due to anoxic encephalopathy.  CCM suspected central apnea.  After communicating with family, he was transitioned to full comfort care on 2/23 and transferred out of ICU.  Patient subsequently demised on Jul 28, 2019 at 1208 p.m.  Since [facility] had taken care of this patient during the course of his entire hospitalization except briefly this morning, I requested and Dr. Delton Coombes kindly agreed to complete the death certificate as well as death summary.  Marcellus Scott, MD, [place], [facility]. [facility]  To contact the attending provider between 7A-7P or the covering provider during after hours 7P-7A, please log into the web site [email or web address] and access using universal [place] password for that web site. If  you do not have the password, please call the hospital operator.

## 2019-07-28 DEATH — deceased

## 2019-08-26 DIAGNOSIS — R58 Hemorrhage, not elsewhere classified: Secondary | ICD-10-CM | POA: Diagnosis not present

## 2019-08-26 DIAGNOSIS — I4892 Unspecified atrial flutter: Secondary | ICD-10-CM | POA: Diagnosis not present

## 2019-08-26 DIAGNOSIS — E119 Type 2 diabetes mellitus without complications: Secondary | ICD-10-CM

## 2019-08-28 NOTE — Death Summary Note (Signed)
  DEATH SUMMARY   Patient Details  Name: Seth Boyd MRN: [id number] DOB: 01-Apr-1935  Admission/Discharge Information   Admit Date:  2019/07/21  Date of Death: Date of Death: 08-14-19  Time of Death: Time of Death: 09/30/1206  Length of Stay: 10/12/2022  Referring Physician: Patient, No Pcp Per   Reason(s) for Hospitalization  Found unresponsive, CVA  Diagnoses  Preliminary cause of death:  Secondary Diagnoses (including complications and co-morbidities):  Principal Problem:   CVA (cerebral vascular accident) (HCC) Active Problems:   Altered mental status   Toxic encephalopathy   Protein-calorie malnutrition, severe   Acute respiratory failure (HCC)   Acute encephalopathy   Aspiration pneumonia (HCC)   Septic shock (HCC)   Hemorrhagic shock (HCC)   Transaminitis   AKI (acute kidney injury) (HCC)   Atrial flutter (HCC)   Diabetes mellitus (HCC)   Retroperitoneal bleed   Brief Hospital Course (including significant findings, care, treatment, and services provided and events leading to death)  [name] was an 85 year old man with hypertension and diabetes.  He was admitted after being found down unresponsive with suspected seizures versus TIA with acute encephalopathy and associated respiratory failure. Characterized by periods of apnea requiring mechanical ventilation.  He failed extubation on 2/4 and 2/8 due to apnea, possibly central apneas..  Experienced a PEA arrest on the second occasion, ROSC after 3 minutes.  He experienced acute renal failure, atrial flutter started on anticoagulation. Course complicated by hemorrhagic shock due to a retroperitoneal bleed (anticoagulation stopped), sepsis due to Enterobacter pneumonia (treated).  He remained poorly responsive and encephalopathic even after hemodynamic improvement and discontinuation of sedating medications.    Neurologic evaluation consistent with anoxic injury due to his arrest.  As were undertaken with the patient's family  and based on the patient's goals for his care decision was made to avoid tracheostomy or prolonged mechanical ventilation.  Decision was made to perform a one-way extubation on 2/22 to see if he could tolerate the work of breathing, had adequate airway protection.  He showed increased difficulty with secretions, intermittent desaturations.  Decision was made on 2/23 to pursue comfort care.  He expired on 08/14/2019.  [name], MD, PhD 08/26/2019, 3:12 PM [facility] [phone] or if no answer [phone]
